# Patient Record
Sex: Male | Born: 1965 | Race: Black or African American | Hispanic: No | Marital: Married | Smoking: Former smoker
Health system: Southern US, Community
[De-identification: ages and names within clinical notes are randomized; demographics above are authoritative.]

## PROBLEM LIST (undated history)

## (undated) DIAGNOSIS — R519 Headache, unspecified: Secondary | ICD-10-CM

## (undated) DIAGNOSIS — R51 Headache: Secondary | ICD-10-CM

## (undated) DIAGNOSIS — G4730 Sleep apnea, unspecified: Secondary | ICD-10-CM

## (undated) DIAGNOSIS — E7800 Pure hypercholesterolemia, unspecified: Secondary | ICD-10-CM

## (undated) DIAGNOSIS — R011 Cardiac murmur, unspecified: Secondary | ICD-10-CM

## (undated) DIAGNOSIS — T7840XA Allergy, unspecified, initial encounter: Secondary | ICD-10-CM

## (undated) DIAGNOSIS — G4733 Obstructive sleep apnea (adult) (pediatric): Secondary | ICD-10-CM

## (undated) DIAGNOSIS — I10 Essential (primary) hypertension: Secondary | ICD-10-CM

## (undated) HISTORY — DX: Obstructive sleep apnea (adult) (pediatric): G47.33

## (undated) HISTORY — DX: Sleep apnea, unspecified: G47.30

## (undated) HISTORY — DX: Allergy, unspecified, initial encounter: T78.40XA

## (undated) HISTORY — DX: Headache: R51

## (undated) HISTORY — DX: Essential (primary) hypertension: I10

## (undated) HISTORY — DX: Cardiac murmur, unspecified: R01.1

## (undated) HISTORY — DX: Headache, unspecified: R51.9

---

## 1997-04-10 HISTORY — PX: NASAL SINUS SURGERY: SHX719

## 1997-07-19 ENCOUNTER — Emergency Department (HOSPITAL_COMMUNITY): Admission: EM | Admit: 1997-07-19 | Discharge: 1997-07-19 | Payer: Self-pay | Admitting: Internal Medicine

## 1997-08-02 ENCOUNTER — Ambulatory Visit (HOSPITAL_COMMUNITY): Admission: RE | Admit: 1997-08-02 | Discharge: 1997-08-02 | Payer: Self-pay | Admitting: Psychiatry

## 1997-08-14 ENCOUNTER — Emergency Department (HOSPITAL_COMMUNITY): Admission: EM | Admit: 1997-08-14 | Discharge: 1997-08-14 | Payer: Self-pay | Admitting: Emergency Medicine

## 1997-08-23 ENCOUNTER — Emergency Department (HOSPITAL_COMMUNITY): Admission: EM | Admit: 1997-08-23 | Discharge: 1997-08-23 | Payer: Self-pay | Admitting: Emergency Medicine

## 1998-01-16 ENCOUNTER — Emergency Department (HOSPITAL_COMMUNITY): Admission: EM | Admit: 1998-01-16 | Discharge: 1998-01-16 | Payer: Self-pay | Admitting: Emergency Medicine

## 1998-02-13 ENCOUNTER — Emergency Department (HOSPITAL_COMMUNITY): Admission: EM | Admit: 1998-02-13 | Discharge: 1998-02-14 | Payer: Self-pay | Admitting: Emergency Medicine

## 1998-05-28 ENCOUNTER — Emergency Department (HOSPITAL_COMMUNITY): Admission: EM | Admit: 1998-05-28 | Discharge: 1998-05-28 | Payer: Self-pay | Admitting: Emergency Medicine

## 1998-07-18 ENCOUNTER — Emergency Department (HOSPITAL_COMMUNITY): Admission: EM | Admit: 1998-07-18 | Discharge: 1998-07-18 | Payer: Self-pay | Admitting: Emergency Medicine

## 1998-07-19 ENCOUNTER — Emergency Department (HOSPITAL_COMMUNITY): Admission: EM | Admit: 1998-07-19 | Discharge: 1998-07-19 | Payer: Self-pay

## 1998-09-26 ENCOUNTER — Emergency Department (HOSPITAL_COMMUNITY): Admission: EM | Admit: 1998-09-26 | Discharge: 1998-09-26 | Payer: Self-pay | Admitting: Emergency Medicine

## 1998-12-31 ENCOUNTER — Emergency Department (HOSPITAL_COMMUNITY): Admission: EM | Admit: 1998-12-31 | Discharge: 1999-01-01 | Payer: Self-pay | Admitting: Emergency Medicine

## 1999-04-02 ENCOUNTER — Emergency Department (HOSPITAL_COMMUNITY): Admission: EM | Admit: 1999-04-02 | Discharge: 1999-04-02 | Payer: Self-pay

## 1999-04-03 ENCOUNTER — Emergency Department (HOSPITAL_COMMUNITY): Admission: EM | Admit: 1999-04-03 | Discharge: 1999-04-03 | Payer: Self-pay | Admitting: Emergency Medicine

## 1999-06-05 ENCOUNTER — Emergency Department (HOSPITAL_COMMUNITY): Admission: EM | Admit: 1999-06-05 | Discharge: 1999-06-05 | Payer: Self-pay

## 1999-07-09 ENCOUNTER — Emergency Department (HOSPITAL_COMMUNITY): Admission: EM | Admit: 1999-07-09 | Discharge: 1999-07-10 | Payer: Self-pay | Admitting: Emergency Medicine

## 1999-07-10 ENCOUNTER — Encounter: Payer: Self-pay | Admitting: Emergency Medicine

## 1999-10-26 ENCOUNTER — Emergency Department (HOSPITAL_COMMUNITY): Admission: EM | Admit: 1999-10-26 | Discharge: 1999-10-26 | Payer: Self-pay | Admitting: Emergency Medicine

## 2000-01-09 ENCOUNTER — Emergency Department (HOSPITAL_COMMUNITY): Admission: EM | Admit: 2000-01-09 | Discharge: 2000-01-09 | Payer: Self-pay | Admitting: Emergency Medicine

## 2000-03-10 ENCOUNTER — Emergency Department (HOSPITAL_COMMUNITY): Admission: EM | Admit: 2000-03-10 | Discharge: 2000-03-10 | Payer: Self-pay | Admitting: Emergency Medicine

## 2000-03-24 ENCOUNTER — Emergency Department (HOSPITAL_COMMUNITY): Admission: EM | Admit: 2000-03-24 | Discharge: 2000-03-24 | Payer: Self-pay | Admitting: Internal Medicine

## 2001-07-04 ENCOUNTER — Encounter: Payer: Self-pay | Admitting: Emergency Medicine

## 2001-07-04 ENCOUNTER — Emergency Department (HOSPITAL_COMMUNITY): Admission: EM | Admit: 2001-07-04 | Discharge: 2001-07-04 | Payer: Self-pay | Admitting: Emergency Medicine

## 2001-07-08 ENCOUNTER — Encounter: Payer: Self-pay | Admitting: Occupational Medicine

## 2001-07-08 ENCOUNTER — Encounter: Admission: RE | Admit: 2001-07-08 | Discharge: 2001-07-08 | Payer: Self-pay | Admitting: Occupational Medicine

## 2001-07-12 ENCOUNTER — Encounter: Payer: Self-pay | Admitting: Emergency Medicine

## 2001-07-12 ENCOUNTER — Emergency Department (HOSPITAL_COMMUNITY): Admission: EM | Admit: 2001-07-12 | Discharge: 2001-07-12 | Payer: Self-pay | Admitting: Emergency Medicine

## 2001-12-13 ENCOUNTER — Encounter: Payer: Self-pay | Admitting: Cardiology

## 2001-12-13 ENCOUNTER — Inpatient Hospital Stay (HOSPITAL_COMMUNITY): Admission: EM | Admit: 2001-12-13 | Discharge: 2001-12-14 | Payer: Self-pay | Admitting: Emergency Medicine

## 2002-04-01 ENCOUNTER — Emergency Department (HOSPITAL_COMMUNITY): Admission: EM | Admit: 2002-04-01 | Discharge: 2002-04-02 | Payer: Self-pay | Admitting: Emergency Medicine

## 2002-05-20 ENCOUNTER — Encounter: Payer: Self-pay | Admitting: Emergency Medicine

## 2002-05-20 ENCOUNTER — Emergency Department (HOSPITAL_COMMUNITY): Admission: EM | Admit: 2002-05-20 | Discharge: 2002-05-20 | Payer: Self-pay | Admitting: Emergency Medicine

## 2003-01-08 ENCOUNTER — Emergency Department (HOSPITAL_COMMUNITY): Admission: EM | Admit: 2003-01-08 | Discharge: 2003-01-08 | Payer: Self-pay | Admitting: Emergency Medicine

## 2003-02-21 ENCOUNTER — Emergency Department (HOSPITAL_COMMUNITY): Admission: EM | Admit: 2003-02-21 | Discharge: 2003-02-21 | Payer: Self-pay | Admitting: Emergency Medicine

## 2003-12-17 ENCOUNTER — Emergency Department (HOSPITAL_COMMUNITY): Admission: EM | Admit: 2003-12-17 | Discharge: 2003-12-18 | Payer: Self-pay | Admitting: Emergency Medicine

## 2004-07-12 ENCOUNTER — Emergency Department (HOSPITAL_COMMUNITY): Admission: EM | Admit: 2004-07-12 | Discharge: 2004-07-12 | Payer: Self-pay | Admitting: Emergency Medicine

## 2005-03-10 ENCOUNTER — Observation Stay (HOSPITAL_COMMUNITY): Admission: EM | Admit: 2005-03-10 | Discharge: 2005-03-11 | Payer: Self-pay | Admitting: Emergency Medicine

## 2005-03-10 ENCOUNTER — Encounter (INDEPENDENT_AMBULATORY_CARE_PROVIDER_SITE_OTHER): Payer: Self-pay | Admitting: *Deleted

## 2005-08-12 ENCOUNTER — Emergency Department (HOSPITAL_COMMUNITY): Admission: EM | Admit: 2005-08-12 | Discharge: 2005-08-12 | Payer: Self-pay | Admitting: Emergency Medicine

## 2005-12-01 ENCOUNTER — Emergency Department (HOSPITAL_COMMUNITY): Admission: EM | Admit: 2005-12-01 | Discharge: 2005-12-01 | Payer: Self-pay | Admitting: Emergency Medicine

## 2006-11-07 ENCOUNTER — Emergency Department (HOSPITAL_COMMUNITY): Admission: EM | Admit: 2006-11-07 | Discharge: 2006-11-07 | Payer: Self-pay | Admitting: Emergency Medicine

## 2007-02-11 HISTORY — PX: APPENDECTOMY: SHX54

## 2007-07-21 ENCOUNTER — Ambulatory Visit: Payer: Self-pay | Admitting: Internal Medicine

## 2007-07-22 ENCOUNTER — Ambulatory Visit: Payer: Self-pay | Admitting: *Deleted

## 2008-02-15 ENCOUNTER — Emergency Department (HOSPITAL_COMMUNITY): Admission: EM | Admit: 2008-02-15 | Discharge: 2008-02-15 | Payer: Self-pay | Admitting: Emergency Medicine

## 2009-03-19 ENCOUNTER — Emergency Department (HOSPITAL_COMMUNITY): Admission: EM | Admit: 2009-03-19 | Discharge: 2009-03-19 | Payer: Self-pay | Admitting: Emergency Medicine

## 2009-04-18 ENCOUNTER — Emergency Department (HOSPITAL_COMMUNITY): Admission: EM | Admit: 2009-04-18 | Discharge: 2009-04-18 | Payer: Self-pay | Admitting: Family Medicine

## 2010-06-28 NOTE — H&P (Signed)
NAME:  Rick Harris, Rick Harris                          ACCOUNT NO.:  [id number]   MEDICAL RECORD NO.:  [id number]                   PATIENT TYPE:  EMS   LOCATION:  ED                                   FACILITY:  [facility]   PHYSICIAN:  Learta Codding, M.D. [facility]             DATE OF BIRTH:  [birth year]   DATE OF ADMISSION:  12/13/2001  DATE OF DISCHARGE:                                HISTORY & PHYSICAL   PRIMARY CARE PHYSICIAN:  None.   CARDIOLOGIST:  New -- Dr. Learta Codding.   CURRENT COMPLAINTS:  Presyncope and mildly elevated cardiac enzymes.   HISTORY OF PRESENT ILLNESS:  The patient is a 45 year old African American  with no significant past medical history.  The patient did report that  several months ago while at work, injuring himself and having several rib  fractures.  The patient, tonight, was at work and around [employer], when he was  looking for materials for loading his truck, he started developing  dizziness.  The patient went inside and drank some water when he started  coughing.  This was associated with intense pain in the left side of the  chest at the site of his prior rib fractures.  He also felt that he was  getting an asthma attack.  Subsequently, the patient started feeling  extremely dizzy and felt presyncopal.  However, he states that clearly like  he never blacked out.  He told his boss and felt that he needed to go home.  He drove himself home but felt quite dizzy, although then the patient  decided to come to the emergency room for further evaluation.  He denies  currently shortness of breath or orthopnea and PND.  He does have left-sided  chest pain, particularly on deep inspiration at the level of the 7th rib.  He currently has no further dizziness.  He does report a lazy eye on the  right eye which was noticeable when I was interviewing the patient.  He was  evaluated in the emergency room with cardiac enzymes with mildly elevated CK  and CK-MB; his troponin, however,  was negative.  His electrocardiogram  showed ST elevation in lead II, III and aVF, as well as ST elevation in lead  V3 and V4, however, he states this appears to be most consistent with early  repolarization.  A bedside echocardiographic study was done which  demonstrated normal LV function, no segmental wall motion abnormality and no  significant valvular lesions.   ALLERGIES:  No known drug allergies.   PAST MEDICAL HISTORY:  1. History of rib fractures briefly as outlined above.  2. History of asthma.  3. Hyperlipidemia.   SOCIAL HISTORY:  The patient lives in [place] with his wife.  He works  for a [employer].  He denies tobacco use.  He denies alcohol  use.   FAMILY HISTORY:  Noncontributory.  REVIEW OF SYSTEMS:  As per HPI.  No nausea or vomiting.  No fever or chills.  No orthopnea or PND.  No palpitations.  No syncope.   PHYSICAL EXAMINATION:  VITAL SIGNS:  Blood pressure is 130/96 with a heart  rate of 48 beats per minute.  Temperature is afebrile.  GENERAL:  Well-nourished African American in no apparent distress.  HEENT:  Pupils are equal.  Conjunctivae clear.  Drift of the right eye.  NECK:  Neck is supple.  Normal upstroke.  No carotid bruits.  LUNGS:  Lungs clear.  HEART:  Regular rate and rhythm.  Normal S1 and S2 and no murmurs, rubs, or  gallops.  ABDOMEN:  Abdomen soft and nontender.  No rebound or guarding.  Good bowel  sounds.  EXTREMITIES:  Peripheral pulses 2+.  No cyanosis, clubbing or edema.   LABORATORY AND ACCESSORY CLINICAL DATA:  Labs are currently pending but CK  and CK-MB were reportedly mildly elevated.  Troponin was negative.   Twelve-lead electrocardiogram:  Sinus bradycardia, early repolarization,  probably normal variant.   IMPRESSION AND PLAN:  1. Presyncope.  The patient does report a prolonged episode of marked     dizziness, however, he reported prodromal pain in the left chest as well     as a sensation of feeling hot  and short of breath.  I think his episode     is most consistent with a vasovagal/vasodepressor episode.  However, he     does have mildly elevated CK-MB fraction and he has an abnormal     electrocardiogram (although most consistent with early repolarization),     however, I do think it is reasonable to admit the patient overnight and     rule out myocardial infarction, particularly based on the abnormal     electrocardiogram.  His echocardiographic study does not show any wall     motion abnormalities, however.  I suspect his symptoms may well be     related to his coughing spell with associated left-sided pain at the site     of prior rib fractures.  We will give the patient pain control for     tonight.  I have also requested that he follow up with his orthopedic     doctor, with whom he has an appointment at 3:45 tomorrow.  2. Elevated CK and CK-MB.  Doubt this patient has a myocardial infarction.     Will get troponin every eight hours x2.  Of note was that the bedside     echocardiogram was negative.  3. Rule out pulmonary embolism.  We will get a D-dimer level; if negative,     no further workup is needed.  4. Chronic chest wall pain with history of rib fractures.  5. Disposition:  Anticipate discharge in the morning if cardiac enzymes and     D-dimer level are negative.  I have discussed with the patient to     possibly obtain an outpatient Cardiolite study in the next couple of     weeks after his rib pain has resolved.                                               Learta Codding, M.D. [facility]    GED/MEDQ  D:  12/13/2001  T:  12/14/2001  Job:  [id number]

## 2010-06-28 NOTE — H&P (Signed)
NAME:  Rick Harris, Rick Harris                ACCOUNT NO.:  [id number]   MEDICAL RECORD NO.:  [id number]          PATIENT TYPE:  EMS   LOCATION:  ED                           FACILITY:  [facility]   PHYSICIAN:  Alfonse Ras, MD   DATE OF BIRTH:  Jan 17, 1966   DATE OF ADMISSION:  03/10/2005  DATE OF DISCHARGE:                                HISTORY & PHYSICAL   ADMISSION DIAGNOSIS:  Abdominal pain, probably acute appendicitis.   HISTORY OF PRESENT ILLNESS:  The patient is a 45 year old black male who  started with right lower quadrant abdominal pain about 4 days ago.  He has  had no nausea or vomiting.  Was seen at [facility] and sent to [facility]  Radiology.  Per the patient's report, the CT showed acute appendicitis.  The  patient has continued with unchanged pain in his right lower quadrant for  the last 3 days.  The patient is hungry.  Workup in the emergency room  showed a normal white count of 8000 without change in differential.   PAST MEDICAL HISTORY:  None.   PAST SURGICAL HISTORY:  None.   MEDICATIONS:  Proventil p.r.n. asthma which he takes very seldom.   PHYSICAL EXAMINATION:  GENERAL: He is an age-appropriate black male in  minimal distress sitting on the stretcher in the hallway at [facility]  Emergency Room.  VITAL SIGNS:  Temperature 97.3, blood pressure 131/65, heart rate 45,  respiratory rate 17.  HEENT:  Benign.  Normocephalic and atraumatic.  Pupils equal, round, and  reactive to light.  LUNGS: Clear to auscultation and percussion x2.  HEART: Regular rate and rhythm without murmurs, rubs, or gallops.  ABDOMEN: Soft but tender in the right lower quadrant with positive rebound,  positive Rovsing's sign, and positive localized peritoneal tenderness.  EXTREMITIES:  Shows no clubbing, cyanosis, or edema.  Normal gait and  station.   IMPRESSION:  Acute appendicitis.   PLAN:  Laparoscopic appendectomy.   I discussed the risks and indications with the patient, explaining  there is  risk of bleeding, infection, injury to surrounding structures, anesthesia  risk and even death.  The patient understands and wishes to proceed.      Alfonse Ras, MD  Electronically Signed     KRE/MEDQ  D:  03/10/2005  T:  03/10/2005  Job:  [id number]

## 2010-06-28 NOTE — Discharge Summary (Signed)
NAME:  Rick Harris, Rick Harris                          ACCOUNT NO.:  [id number]   MEDICAL RECORD NO.:  [id number]                   PATIENT TYPE:  INP   LOCATION:  0378                                 FACILITY:  [facility]   PHYSICIAN:  Learta Codding, M.D. [facility]             DATE OF BIRTH:  1966/02/01   DATE OF ADMISSION:  12/13/2001  DATE OF DISCHARGE:  12/14/2001                           DISCHARGE SUMMARY - REFERRING   HISTORY:  The patient is a 45 year old African-American male without  significant past medical history. Several months ago while at work, he  injured himself and suffered several rib fractures.  On the evening of  12/13/2001 at around 6:15, he developed some dizziness while preparing to  load his truck.  The patient went inside and drank some water and started  coughing. This was associated with left-sided intense pain at the site of  his prior rib fractures.  He also felt that he may be getting an asthma  attack.  At that point, he felt dizzy and presyncopal.  He never did black  out and told his boss that he needed to go home.  He did drive himself home  but felt quite dizzy and then decided to come to the emergency room for  further evaluation.   In the emergency room, he continued to hae left-sided chest discomfort,  particularly on deep inspiration at the level of  the seventh rib.  He did  not have any further dizziness.  Initial CK-MB was abnormal with a normal  troponin.  An EKG showed early repolarization; thus he is admitted for  observation.   HISTORY AND PHYSICAL:  1. Asthma.  2. Hyperlipidemia.  3. Recent rib fractures.  4. Slight obesity.   LABORATORY DATA:  Admission hemoglobin 12.4, hematocrit 37.9, platelets 241,  WBC 7.8. Sodium 138, potassium 4.1, BUN 14, creatinine 1.2, glucose 105.  Normal LFTs.  D-dimer was less than 0.22  CK total, MB were 406, 102 and  relative index 3.  The second CK total was 333 with MB 8.8 and relative  index of 2.6.  Troponins  were 0.1 and less than 0.01.   EKG showed sinus bradycardia, early repolarization, nonspecific ST-T wave  changes.   HOSPITAL COURSE:  The patient was admitted 3 [facility] at [facility]  overnight.  He did not have any further dizziness; however, he was still  complaining of left-sided chest discomfort, especially with inspiration and  movement.  D-dimer was within normal limits.  Troponin and EKG negative for  myocardial infarction.  After review by Dr. Eden Emms, it was felt the near  syncope was possibly related to coughing and pain exacerbation by recent rib  fractures.  He does have abnormal CK and total MB of unknown etiology.  It  was noted a bedside echocardiogram did not show any abnormalities.   DISPOSITION:  He is discharged home and asked to continue his  home  medications which are believed to include Bextra, Vicodin, Voltaren, and  Flexeril.  He will see orthopedics this afternoon for followup.  He was  asked to maintain low-fat, low-salt, and low-cholesterol diet.  His activity  from a cardiac standpoint was not restricted.  He  will undergo stress Cardiolite on Thursday, November 13, at 9:30 a.m.  He is  instructed not to eat or drink after midnight; however, he may take his  medications with a small sip of water.  He was also advised that he should  obtain a primary care physician.     Joellyn Rued, P.A. [facility]                    Learta Codding, M.D. [facility]    EW/MEDQ  D:  12/14/2001  T:  12/14/2001  Job:  [id number]   cc:   Patient's home

## 2010-06-28 NOTE — Op Note (Signed)
Rick Harris, Rick Harris                ACCOUNT NO.:  [id number]   MEDICAL RECORD NO.:  [id number]          PATIENT TYPE:  INP   LOCATION:  1010                         FACILITY:  [facility]   PHYSICIAN:  Alfonse Ras, MD   DATE OF BIRTH:  1966/02/02   DATE OF PROCEDURE:  03/10/2005  DATE OF DISCHARGE:                                 OPERATIVE REPORT   PREOPERATIVE DIAGNOSIS:  Acute appendicitis.   POSTOPERATIVE DIAGNOSIS:  Acute appendicitis.   PROCEDURE:  Laparoscopic appendectomy.   ANESTHESIA:  General.   SURGEON:  Alfonse Ras, M.D.   DESCRIPTION:  The patient was taken to the operating room and placed in the  supine position after adequate general anesthesia was induced. Foley  catheter was placed and the abdomen was prepped and of normal sterile  fashion. Using a 12 mm OptiVu, peritoneal access was obtained under direct  vision through the left abdomen. Additional 5 and 12 mm trocars were placed.  An acutely inflamed appendix was identified, grasped and retracted  anteriorly. Mesoappendix was taken down with harmonic scalpel. The base the  appendix was transected using a vascular load GIA 45 stapling device. It was  placed in an EndoCatch bag and removed through the left lower quadrant port.  Adequate hemostasis was ensured. The right lower quadrant was irrigated.  Pneumoperitoneum was released. Trocars were removed. Skin incisions were  closed with subcuticular 4-0 Monocryl. Steri-Strips and sterile dressings  were applied. The patient tolerated the procedure well and went to the PACU  in good condition.      Alfonse Ras, MD  Electronically Signed     KRE/MEDQ  D:  03/11/2005  T:  03/11/2005  Job:  [id number]

## 2011-05-19 ENCOUNTER — Emergency Department (HOSPITAL_COMMUNITY): Payer: PRIVATE HEALTH INSURANCE

## 2011-05-19 ENCOUNTER — Encounter (HOSPITAL_COMMUNITY): Payer: Self-pay | Admitting: Emergency Medicine

## 2011-05-19 ENCOUNTER — Emergency Department (HOSPITAL_COMMUNITY)
Admission: EM | Admit: 2011-05-19 | Discharge: 2011-05-19 | Disposition: A | Discharge status: Home / Self Care | Payer: PRIVATE HEALTH INSURANCE | Attending: Emergency Medicine | Admitting: Emergency Medicine

## 2011-05-19 DIAGNOSIS — R0602 Shortness of breath: Secondary | ICD-10-CM | POA: Insufficient documentation

## 2011-05-19 DIAGNOSIS — J40 Bronchitis, not specified as acute or chronic: Secondary | ICD-10-CM | POA: Insufficient documentation

## 2011-05-19 DIAGNOSIS — R05 Cough: Secondary | ICD-10-CM | POA: Insufficient documentation

## 2011-05-19 DIAGNOSIS — R51 Headache: Secondary | ICD-10-CM | POA: Insufficient documentation

## 2011-05-19 DIAGNOSIS — J45909 Unspecified asthma, uncomplicated: Secondary | ICD-10-CM | POA: Insufficient documentation

## 2011-05-19 DIAGNOSIS — E7800 Pure hypercholesterolemia, unspecified: Secondary | ICD-10-CM | POA: Insufficient documentation

## 2011-05-19 DIAGNOSIS — R509 Fever, unspecified: Secondary | ICD-10-CM

## 2011-05-19 DIAGNOSIS — R059 Cough, unspecified: Secondary | ICD-10-CM | POA: Insufficient documentation

## 2011-05-19 HISTORY — DX: Pure hypercholesterolemia, unspecified: E78.00

## 2011-05-19 MED ORDER — ALBUTEROL SULFATE (5 MG/ML) 0.5% IN NEBU
5.0000 mg | INHALATION_SOLUTION | Freq: Once | RESPIRATORY_TRACT | Status: AC
  Administered 2011-05-19: 5 mg via RESPIRATORY_TRACT
  Filled 2011-05-19: qty 0.5

## 2011-05-19 MED ORDER — IPRATROPIUM BROMIDE 0.02 % IN SOLN
0.5000 mg | Freq: Once | RESPIRATORY_TRACT | Status: AC
  Administered 2011-05-19: 0.5 mg via RESPIRATORY_TRACT
  Filled 2011-05-19: qty 2.5

## 2011-05-19 MED ORDER — IBUPROFEN 200 MG PO TABS
600.0000 mg | ORAL_TABLET | Freq: Once | ORAL | Status: AC
  Administered 2011-05-19: 600 mg via ORAL
  Filled 2011-05-19: qty 3

## 2011-05-19 MED ORDER — AZITHROMYCIN 250 MG PO TABS: ORAL_TABLET | ORAL | Status: AC

## 2011-05-19 MED ORDER — ALBUTEROL SULFATE HFA 108 (90 BASE) MCG/ACT IN AERS: 2.0000 | INHALATION_SPRAY | RESPIRATORY_TRACT | Status: DC | PRN

## 2011-05-19 NOTE — ED Provider Notes (Signed)
History     CSN: [id number]  Arrival date & time 05/19/11  4098   First MD Initiated Contact with Patient 05/19/11 [phone]      Chief Complaint  Patient presents with  . Shortness of Breath  . Cough  . Fever  . Headache  . Fatigue    (Consider location/radiation/quality/duration/timing/severity/associated sxs/prior treatment) Patient is a 46 y.o. male presenting with shortness of breath, cough, fever, and headaches. The history is provided by the patient.  Shortness of Breath  Associated symptoms include a fever, cough and shortness of breath. Pertinent negatives include no chest pain.  Cough Associated symptoms include shortness of breath. Pertinent negatives include no chest pain and no eye redness.  Fever Primary symptoms of the febrile illness include fever, cough and shortness of breath. Primary symptoms do not include abdominal pain, vomiting, diarrhea or rash.  Headache  Associated symptoms include a fever and shortness of breath. Pertinent negatives include no vomiting.  pt c/o non productive cough, fever, and wheezing in past couple days. No sore throat or runny nose.  Hx asthma, states uses inhaler prn. +smoker. Cough episodic, no specific exacerbating or alleviating factors.  Denies any recent steroid use, notes single admission w asthma many years ago. No known ill contacts. No chest pain. No gi symptoms. No neck pain or stiffness.      Past Medical History  Diagnosis Date  . Asthma   . Hypercholesteremia     History reviewed. No pertinent past surgical history.  History reviewed. No pertinent family history.  History  Substance Use Topics  . Smoking status: Current Some Day Smoker  . Smokeless tobacco: Not on file  . Alcohol Use: Yes      Review of Systems  Constitutional: Positive for fever.  HENT: Negative for neck pain.   Eyes: Negative for discharge and redness.  Respiratory: Positive for cough and shortness of breath.   Cardiovascular: Negative  for chest pain.  Gastrointestinal: Negative for vomiting, abdominal pain and diarrhea.  Genitourinary: Negative for flank pain.  Musculoskeletal: Negative for back pain.  Skin: Negative for rash.  Neurological: Negative for weakness and numbness.  Hematological: Does not bruise/bleed easily.  Psychiatric/Behavioral: Negative for confusion.    Allergies  Review of patient's allergies indicates no known allergies.  Home Medications  No current outpatient prescriptions on file.  BP 155/69  Pulse 78  Temp(Src) 101.7 F (38.7 C) (Oral)  Resp 20  SpO2 97%  Physical Exam  Nursing note and vitals reviewed. Constitutional: He is oriented to person, place, and time. He appears well-developed and well-nourished. No distress.  HENT:  Head: Atraumatic.  Mouth/Throat: Oropharynx is clear and moist.  Eyes: Pupils are equal, round, and reactive to light.  Neck: Neck supple. No tracheal deviation present.       No stiffness or rigidity  Cardiovascular: Normal rate, regular rhythm, normal heart sounds and intact distal pulses.  Exam reveals no gallop and no friction rub.   No murmur heard. Pulmonary/Chest: Effort normal. No accessory muscle usage. He has wheezes.  Abdominal: Soft. Bowel sounds are normal. He exhibits no distension and no mass. There is no tenderness. There is no rebound and no guarding.  Genitourinary:       No cva tenderness  Musculoskeletal: Normal range of motion. He exhibits no edema and no tenderness.  Lymphadenopathy:    He has no cervical adenopathy.  Neurological: He is alert and oriented to person, place, and time.  Skin: Skin is warm and dry.  Psychiatric: He has a normal mood and affect.    ED Course  Procedures (including critical care time)   No results found for this or any previous visit. Dg Chest 2 View  05/19/2011  *RADIOLOGY REPORT*  Clinical Data: Cough and fever.  CHEST - 2 VIEW  Comparison: 04/18/2009.  Findings: Poor inspiration.  Grossly normal  sized heart.  Clear lungs.  Minimal thoracic spine degenerative changes.  IMPRESSION: No acute abnormality.  Original Report Authenticated By: Darrol Angel, M.D.    MDM  Albuterol and atrovent neb. Cxr. Motrin.   Recheck after single neb, no wheezing. No increased wob. Pt w cough, fever, hx bronchtis/asthma. Will rx bronchitis, alb mdi prn, rec smoking cessation.       Suzi Roots, MD 05/19/11 [phone]

## 2011-05-19 NOTE — Discharge Instructions (Signed)
Take zithromax as prescribed. Use albuterol inhaler as need. Take tylenol/motrin as need. No smoking. Follow up with primary care doctor in 4-5 days if symptoms fail to improve/resolve. Return to ER if worse, trouble breathing, other concern.        Bronchitis Bronchitis is the body's way of reacting to injury and/or infection (inflammation) of the bronchi. Bronchi are the air tubes that extend from the windpipe into the lungs. If the inflammation becomes severe, it may cause shortness of breath. CAUSES  Inflammation may be caused by:  A virus.   Germs (bacteria).   Dust.   Allergens.   Pollutants and many other irritants.  The cells lining the bronchial tree are covered with tiny hairs (cilia). These constantly beat upward, away from the lungs, toward the mouth. This keeps the lungs free of pollutants. When these cells become too irritated and are unable to do their job, mucus begins to develop. This causes the characteristic cough of bronchitis. The cough clears the lungs when the cilia are unable to do their job. Without either of these protective mechanisms, the mucus would settle in the lungs. Then you would develop pneumonia. Smoking is a common cause of bronchitis and can contribute to pneumonia. Stopping this habit is the single most important thing you can do to help yourself. TREATMENT   Your caregiver may prescribe an antibiotic if the cough is caused by bacteria. Also, medicines that open up your airways make it easier to breathe. Your caregiver may also recommend or prescribe an expectorant. It will loosen the mucus to be coughed up. Only take over-the-counter or prescription medicines for pain, discomfort, or fever as directed by your caregiver.   Removing whatever causes the problem (smoking, for example) is critical to preventing the problem from getting worse.   Cough suppressants may be prescribed for relief of cough symptoms.   Inhaled medicines may be prescribed to  help with symptoms now and to help prevent problems from returning.   For those with recurrent (chronic) bronchitis, there may be a need for steroid medicines.  SEEK IMMEDIATE MEDICAL CARE IF:   During treatment, you develop more pus-like mucus (purulent sputum).   You have a fever.   Your baby is older than 3 months with a rectal temperature of 102 F (38.9 C) or higher.   Your baby is 63 months old or younger with a rectal temperature of 100.4 F (38 C) or higher.   You become progressively more ill.   You have increased difficulty breathing, wheezing, or shortness of breath.  It is necessary to seek immediate medical care if you are elderly or sick from any other disease. MAKE SURE YOU:   Understand these instructions.   Will watch your condition.   Will get help right away if you are not doing well or get worse.  Document Released: 01/27/2005 Document Revised: 01/16/2011 Document Reviewed: 12/07/2007 [facility] Patient Information 2012 [place], [place].      Smoking Cessation This document explains the best ways for you to quit smoking and new treatments to help. It lists new medicines that can double or triple your chances of quitting and quitting for good. It also considers ways to avoid relapses and concerns you may have about quitting, including weight gain. NICOTINE: A POWERFUL ADDICTION If you have tried to quit smoking, you know how hard it can be. It is hard because nicotine is a very addictive drug. For some people, it can be as addictive as heroin or cocaine.  Usually, people make 2 or 3 tries, or more, before finally being able to quit. Each time you try to quit, you can learn about what helps and what hurts. Quitting takes hard work and a lot of effort, but you can quit smoking. QUITTING SMOKING IS ONE OF THE MOST IMPORTANT THINGS YOU WILL EVER DO.  You will live longer, feel better, and live better.   The impact on your body of quitting smoking is felt almost  immediately:   Within 20 minutes, blood pressure decreases. Pulse returns to its normal level.   After 8 hours, carbon monoxide levels in the blood return to normal. Oxygen level increases.   After 24 hours, chance of heart attack starts to decrease. Breath, hair, and body stop smelling like smoke.   After 48 hours, damaged nerve endings begin to recover. Sense of taste and smell improve.   After 72 hours, the body is virtually free of nicotine. Bronchial tubes relax and breathing becomes easier.   After 2 to 12 weeks, lungs can hold more air. Exercise becomes easier and circulation improves.   Quitting will reduce your risk of having a heart attack, stroke, cancer, or lung disease:   After 1 year, the risk of coronary heart disease is cut in half.   After 5 years, the risk of stroke falls to the same as a nonsmoker.   After 10 years, the risk of lung cancer is cut in half and the risk of other cancers decreases significantly.   After 15 years, the risk of coronary heart disease drops, usually to the level of a nonsmoker.   If you are pregnant, quitting smoking will improve your chances of having a healthy baby.   The people you live with, especially your children, will be healthier.   You will have extra money to spend on things other than cigarettes.  FIVE KEYS TO QUITTING Studies have shown that these 5 steps will help you quit smoking and quit for good. You have the best chances of quitting if you use them together: 1. Get ready.  2. Get support and encouragement.  3. Learn new skills and behaviors.  4. Get medicine to reduce your nicotine addiction and use it correctly.  5. Be prepared for relapse or difficult situations. Be determined to continue trying to quit, even if you do not succeed at first.  1. GET READY  Set a quit date.   Change your environment.   Get rid of ALL cigarettes, ashtrays, matches, and lighters in your home, car, and place of work.   Do not let  people smoke in your home.   Review your past attempts to quit. Think about what worked and what did not.   Once you quit, do not smoke. NOT EVEN A PUFF!  2. GET SUPPORT AND ENCOURAGEMENT Studies have shown that you have a better chance of being successful if you have help. You can get support in many ways.  Tell your family, friends, and coworkers that you are going to quit and need their support. Ask them not to smoke around you.   Talk to your caregivers (doctor, dentist, nurse, pharmacist, psychologist, and/or smoking counselor).   Get individual, group, or telephone counseling and support. The more counseling you have, the better your chances are of quitting. Programs are available at [employer] and health centers. Call your local health department for information about programs in your area.   Spiritual beliefs and practices may help some smokers quit.  Quit meters are Photographer that keep track of quit statistics, such as amount of "quit-time," cigarettes not smoked, and money saved.   Many smokers find one or more of the many self-help books available useful in helping them quit and stay off tobacco.  3. LEARN NEW SKILLS AND BEHAVIORS  Try to distract yourself from urges to smoke. Talk to someone, go for a walk, or occupy your time with a task.   When you first try to quit, change your routine. Take a different route to work. Drink tea instead of coffee. Eat breakfast in a different place.   Do something to reduce your stress. Take a hot bath, exercise, or read a book.   Plan something enjoyable to do every day. Reward yourself for not smoking.   Explore interactive web-based programs that specialize in helping you quit.  4. GET MEDICINE AND USE IT CORRECTLY Medicines can help you stop smoking and decrease the urge to smoke. Combining medicine with the above behavioral methods and support can quadruple your chances of successfully  quitting smoking. The [place] Food and Drug Administration (FDA) has approved 7 medicines to help you quit smoking. These medicines fall into 3 categories.  Nicotine replacement therapy (delivers nicotine to your body without the negative effects and risks of smoking):   Nicotine gum: Available over-the-counter.   Nicotine lozenges: Available over-the-counter.   Nicotine inhaler: Available by prescription.   Nicotine nasal spray: Available by prescription.   Nicotine skin patches (transdermal): Available by prescription and over-the-counter.   Antidepressant medicine (helps people abstain from smoking, but how this works is unknown):   Bupropion sustained-release (SR) tablets: Available by prescription.   Nicotinic receptor partial agonist (simulates the effect of nicotine in your brain):   Varenicline tartrate tablets: Available by prescription.   Ask your caregiver for advice about which medicines to use and how to use them. Carefully read the information on the package.   Everyone who is trying to quit may benefit from using a medicine. If you are pregnant or trying to become pregnant, nursing an infant, you are under age 58, or you smoke fewer than 10 cigarettes per day, talk to your caregiver before taking any nicotine replacement medicines.   You should stop using a nicotine replacement product and call your caregiver if you experience nausea, dizziness, weakness, vomiting, fast or irregular heartbeat, mouth problems with the lozenge or gum, or redness or swelling of the skin around the patch that does not go away.   Do not use any other product containing nicotine while using a nicotine replacement product.   Talk to your caregiver before using these products if you have diabetes, heart disease, asthma, stomach ulcers, you had a recent heart attack, you have high blood pressure that is not controlled with medicine, a history of irregular heartbeat, or you have been prescribed  medicine to help you quit smoking.  5. BE PREPARED FOR RELAPSE OR DIFFICULT SITUATIONS  Most relapses occur within the first 3 months after quitting. Do not be discouraged if you start smoking again. Remember, most people try several times before they finally quit.   You may have symptoms of withdrawal because your body is used to nicotine. You may crave cigarettes, be irritable, feel very hungry, cough often, get headaches, or have difficulty concentrating.   The withdrawal symptoms are only temporary. They are strongest when you first quit, but they will go away within 10 to 14 days.  Here are  some difficult situations to watch for:  Alcohol. Avoid drinking alcohol. Drinking lowers your chances of successfully quitting.   Caffeine. Try to reduce the amount of caffeine you consume. It also lowers your chances of successfully quitting.   Other smokers. Being around smoking can make you want to smoke. Avoid smokers.   Weight gain. Many smokers will gain weight when they quit, usually less than 10 pounds. Eat a healthy diet and stay active. Do not let weight gain distract you from your main goal, quitting smoking. Some medicines that help you quit smoking may also help delay weight gain. You can always lose the weight gained after you quit.   Bad mood or depression. There are a lot of ways to improve your mood other than smoking.  If you are having problems with any of these situations, talk to your caregiver. SPECIAL SITUATIONS AND CONDITIONS Studies suggest that everyone can quit smoking. Your situation or condition can give you a special reason to quit.  Pregnant women/new mothers: By quitting, you protect your baby's health and your own.   Hospitalized patients: By quitting, you reduce health problems and help healing.   Heart attack patients: By quitting, you reduce your risk of a second heart attack.   Lung, head, and neck cancer patients: By quitting, you reduce your chance of a  second cancer.   Parents of children and adolescents: By quitting, you protect your children from illnesses caused by secondhand smoke.  QUESTIONS TO THINK ABOUT Think about the following questions before you try to stop smoking. You may want to talk about your answers with your caregiver.  Why do you want to quit?   If you tried to quit in the past, what helped and what did not?   What will be the most difficult situations for you after you quit? How will you plan to handle them?   Who can help you through the tough times? Your family? Friends? Caregiver?   What pleasures do you get from smoking? What ways can you still get pleasure if you quit?  Here are some questions to ask your caregiver:  How can you help me to be successful at quitting?   What medicine do you think would be best for me and how should I take it?   What should I do if I need more help?   What is smoking withdrawal like? How can I get information on withdrawal?  Quitting takes hard work and a lot of effort, but you can quit smoking. FOR MORE INFORMATION  Smokefree.gov (http://www.davis-sullivan.com/) provides free, accurate, evidence-based information and professional assistance to help support the immediate and long-term needs of people trying to quit smoking. Document Released: 01/21/2001 Document Revised: 01/16/2011 Document Reviewed: 11/13/2008 [facility] Patient Information 2012 [place], [place].     Asthma, Adult Asthma is caused by narrowing of the air passages in the lungs. It may be triggered by pollen, dust, animal dander, molds, some foods, respiratory infections, exposure to smoke, exercise, emotional stress or other allergens (things that cause allergic reactions or allergies). Repeat attacks are common. HOME CARE INSTRUCTIONS   Use prescription medications as ordered by your caregiver.   Avoid pollen, dust, animal dander, molds, smoke and other things that cause attacks at home and at work.   You may  have fewer attacks if you decrease dust in your home. Electrostatic air cleaners may help.   It may help to replace your pillows or mattress with materials less likely to cause allergies.  Talk to your caregiver about an action plan for managing asthma attacks at home, including, the use of a peak flow meter which measures the severity of your asthma attack. An action plan can help minimize or stop the attack without having to seek medical care.   If you are not on a fluid restriction, drink 8 to 10 glasses of water each day.   Always have a plan prepared for seeking medical attention, including, calling your physician, accessing local emergency care, and calling 911 (in the [place] for a severe attack.   Discuss possible exercise routines with your caregiver.   If animal dander is the cause of asthma, you may need to get rid of pets.  SEEK MEDICAL CARE IF:   You have wheezing and shortness of breath even if taking medicine to prevent attacks.   You have muscle aches, chest pain or thickening of sputum.   Your sputum changes from clear or white to yellow, green, gray, or bloody.   You have any problems that may be related to the medicine you are taking (such as a rash, itching, swelling or trouble breathing).  SEEK IMMEDIATE MEDICAL CARE IF:   Your usual medicines do not stop your wheezing or there is increased coughing and/or shortness of breath.   You have increased difficulty breathing.   You have a fever.  MAKE SURE YOU:   Understand these instructions.   Will watch your condition.   Will get help right away if you are not doing well or get worse.  Document Released: 01/27/2005 Document Revised: 01/16/2011 Document Reviewed: 09/15/2007 [facility] Patient Information 2012 [place], [place].     Hypertension As your heart beats, it forces blood through your arteries. This force is your blood pressure. If the pressure is too high, it is called hypertension (HTN) or high blood  pressure. HTN is dangerous because you may have it and not know it. High blood pressure may mean that your heart has to work harder to pump blood. Your arteries may be narrow or stiff. The extra work puts you at risk for heart disease, stroke, and other problems.  Blood pressure consists of two numbers, a higher number over a lower, 110/72, for example. It is stated as "110 over 72." The ideal is below 120 for the top number (systolic) and under 80 for the bottom (diastolic). Write down your blood pressure today. You should pay close attention to your blood pressure if you have certain conditions such as:  Heart failure.   Prior heart attack.   Diabetes   Chronic kidney disease.   Prior stroke.   Multiple risk factors for heart disease.  To see if you have HTN, your blood pressure should be measured while you are seated with your arm held at the level of the heart. It should be measured at least twice. A one-time elevated blood pressure reading (especially in the Emergency Department) does not mean that you need treatment. There may be conditions in which the blood pressure is different between your right and left arms. It is important to see your caregiver soon for a recheck. Most people have essential hypertension which means that there is not a specific cause. This type of high blood pressure may be lowered by changing lifestyle factors such as:  Stress.   Smoking.   Lack of exercise.   Excessive weight.   Drug/tobacco/alcohol use.   Eating less salt.  Most people do not have symptoms from high blood pressure until it has  caused damage to the body. Effective treatment can often prevent, delay or reduce that damage. TREATMENT  When a cause has been identified, treatment for high blood pressure is directed at the cause. There are a large number of medications to treat HTN. These fall into several categories, and your caregiver will help you select the medicines that are best for you.  Medications may have side effects. You should review side effects with your caregiver. If your blood pressure stays high after you have made lifestyle changes or started on medicines,   Your medication(s) may need to be changed.   Other problems may need to be addressed.   Be certain you understand your prescriptions, and know how and when to take your medicine.   Be sure to follow up with your caregiver within the time frame advised (usually within two weeks) to have your blood pressure rechecked and to review your medications.   If you are taking more than one medicine to lower your blood pressure, make sure you know how and at what times they should be taken. Taking two medicines at the same time can result in blood pressure that is too low.  SEEK IMMEDIATE MEDICAL CARE IF:  You develop a severe headache, blurred or changing vision, or confusion.   You have unusual weakness or numbness, or a faint feeling.   You have severe chest or abdominal pain, vomiting, or breathing problems.  MAKE SURE YOU:   Understand these instructions.   Will watch your condition.   Will get help right away if you are not doing well or get worse.  Document Released: 01/27/2005 Document Revised: 01/16/2011 Document Reviewed: 09/17/2007 [facility] Patient Information 2012 [place], [place].

## 2011-05-19 NOTE — ED Notes (Addendum)
Pt states he has had a cough, asthma flare-up, fever, ha and weakness since yesterday am.  Pt states cough is non-productive and has gotten worse since yesterday am.  Pt states family member has had similar symptoms.  Pt's lungs have wheezing and rhonchi.  93% on Ra.  97% on 2L.  Pt state that R side feels weak

## 2012-10-19 ENCOUNTER — Other Ambulatory Visit: Payer: Self-pay | Admitting: Family Medicine

## 2012-10-19 ENCOUNTER — Ambulatory Visit
Admission: RE | Admit: 2012-10-19 | Discharge: 2012-10-19 | Disposition: A | Discharge status: Home / Self Care | Payer: Worker's Compensation | Source: Ambulatory Visit | Attending: Family Medicine | Admitting: Family Medicine

## 2012-10-19 DIAGNOSIS — T148XXA Other injury of unspecified body region, initial encounter: Secondary | ICD-10-CM

## 2012-10-19 DIAGNOSIS — M25462 Effusion, left knee: Secondary | ICD-10-CM

## 2013-07-29 ENCOUNTER — Ambulatory Visit (INDEPENDENT_AMBULATORY_CARE_PROVIDER_SITE_OTHER): Payer: 59 | Admitting: Internal Medicine

## 2013-07-29 ENCOUNTER — Other Ambulatory Visit (INDEPENDENT_AMBULATORY_CARE_PROVIDER_SITE_OTHER): Payer: 59

## 2013-07-29 ENCOUNTER — Encounter: Payer: Self-pay | Admitting: Internal Medicine

## 2013-07-29 VITALS — BP 132/80 | HR 52 | Temp 98.4°F | Ht 71.0 in | Wt 243.1 lb

## 2013-07-29 DIAGNOSIS — Z0000 Encounter for general adult medical examination without abnormal findings: Secondary | ICD-10-CM

## 2013-07-29 DIAGNOSIS — R0683 Snoring: Secondary | ICD-10-CM

## 2013-07-29 DIAGNOSIS — G4733 Obstructive sleep apnea (adult) (pediatric): Secondary | ICD-10-CM | POA: Insufficient documentation

## 2013-07-29 DIAGNOSIS — R0989 Other specified symptoms and signs involving the circulatory and respiratory systems: Secondary | ICD-10-CM

## 2013-07-29 DIAGNOSIS — R001 Bradycardia, unspecified: Secondary | ICD-10-CM | POA: Insufficient documentation

## 2013-07-29 DIAGNOSIS — E785 Hyperlipidemia, unspecified: Secondary | ICD-10-CM

## 2013-07-29 DIAGNOSIS — R0609 Other forms of dyspnea: Secondary | ICD-10-CM

## 2013-07-29 LAB — URINALYSIS, ROUTINE W REFLEX MICROSCOPIC
Bilirubin Urine: NEGATIVE
Hgb urine dipstick: NEGATIVE
Ketones, ur: NEGATIVE
Leukocytes, UA: NEGATIVE
NITRITE: NEGATIVE
PH: 6 (ref 5.0–8.0)
Specific Gravity, Urine: >=1.03 — AB (ref 1.000–1.030)
Urine Glucose: NEGATIVE
Urobilinogen, UA: 0.2 (ref 0.0–1.0)

## 2013-07-29 LAB — BASIC METABOLIC PANEL
BUN: 15 mg/dL (ref 6–23)
CALCIUM: 9.5 mg/dL (ref 8.4–10.5)
CO2: 28 mEq/L (ref 19–32)
Chloride: 104 mEq/L (ref 96–112)
Creatinine, Ser: 1.1 mg/dL (ref 0.4–1.5)
GFR: 97.86 mL/min (ref >60.00)
GLUCOSE: 88 mg/dL (ref 70–99)
Potassium: 4.7 mEq/L (ref 3.5–5.1)
SODIUM: 139 meq/L (ref 135–145)

## 2013-07-29 LAB — TSH: TSH: 1.84 u[IU]/mL (ref 0.35–4.50)

## 2013-07-29 LAB — CBC WITH DIFFERENTIAL/PLATELET
BASOS PCT: 0.4 % (ref 0.0–3.0)
Basophils Absolute: 0 10*3/uL (ref 0.0–0.1)
EOS ABS: 0.4 10*3/uL (ref 0.0–0.7)
EOS PCT: 5.3 % — AB (ref 0.0–5.0)
HEMATOCRIT: 40.4 % (ref 39.0–52.0)
Hemoglobin: 12.9 g/dL — ABNORMAL LOW (ref 13.0–17.0)
Lymphocytes Relative: 44.4 % (ref 12.0–46.0)
Lymphs Abs: 3.5 10*3/uL (ref 0.7–4.0)
MCHC: 31.9 g/dL (ref 30.0–36.0)
MCV: 72.5 fl — ABNORMAL LOW (ref 78.0–100.0)
Monocytes Absolute: 0.4 10*3/uL (ref 0.1–1.0)
Monocytes Relative: 4.6 % (ref 3.0–12.0)
NEUTROS ABS: 3.6 10*3/uL (ref 1.4–7.7)
NEUTROS PCT: 45.3 % (ref 43.0–77.0)
PLATELETS: 253 10*3/uL (ref 150.0–400.0)
RBC: 5.57 Mil/uL (ref 4.22–5.81)
RDW: 14.3 % (ref 11.5–15.5)
WBC: 7.9 10*3/uL (ref 4.0–10.5)

## 2013-07-29 LAB — HEPATIC FUNCTION PANEL
ALT: 41 U/L (ref 0–53)
AST: 39 U/L — ABNORMAL HIGH (ref 0–37)
Albumin: 4.2 g/dL (ref 3.5–5.2)
Alkaline Phosphatase: 71 U/L (ref 39–117)
BILIRUBIN TOTAL: 0.5 mg/dL (ref 0.2–1.2)
Bilirubin, Direct: 0.1 mg/dL (ref 0.0–0.3)
TOTAL PROTEIN: 7.7 g/dL (ref 6.0–8.3)

## 2013-07-29 LAB — LIPID PANEL
CHOLESTEROL: 266 mg/dL — AB (ref 0–200)
HDL: 54.5 mg/dL (ref >39.00)
LDL Cholesterol: 174 mg/dL — ABNORMAL HIGH (ref 0–99)
NonHDL: 211.5
TRIGLYCERIDES: 187 mg/dL — AB (ref 0.0–149.0)
Total CHOL/HDL Ratio: 5
VLDL: 37.4 mg/dL (ref 0.0–40.0)

## 2013-07-29 LAB — PSA: PSA: 0.32 ng/mL (ref 0.10–4.00)

## 2013-07-29 MED ORDER — PRAVASTATIN SODIUM 20 MG PO TABS: 20.0000 mg | ORAL_TABLET | Freq: Every day | ORAL | Status: DC

## 2013-07-29 NOTE — Progress Notes (Signed)
Pre visit review using our clinic review tool, if applicable. No additional management support is needed unless otherwise documented below in the visit note. 

## 2013-07-29 NOTE — Progress Notes (Signed)
Subjective:    Patient ID: Rick Harris, male    DOB: [birth year], 48 y.o.   MRN: [id number]  HPI  New patient to me, here to establish a PCP patient is here today for annual physical. Patient feels well in general.  Reviewed chronic medical issues and events:  asthma, intermittent - rare use of [place] MDI dyslipidemia (never on rx)  Past Medical History  Diagnosis Date  . Asthma   . Hypercholesteremia   . Frequent headaches    Family History  Problem Relation Age of Onset  . Alcohol abuse Mother   . Hyperlipidemia Father   . Stroke Father   . Hypertension Father   . Diabetes Father   . Lung cancer Maternal Uncle   . Heart disease Maternal Grandmother    History  Substance Use Topics  . Smoking status: Never Smoker   . Smokeless tobacco: Not on file  . Alcohol Use: Yes    Review of Systems  Constitutional: Negative for fever, activity change, appetite change, fatigue and unexpected weight change.  Respiratory: Negative for cough, chest tightness, shortness of breath and wheezing.   Cardiovascular: Negative for chest pain, palpitations and leg swelling.  Skin: Positive for wound (?wart thumb pad x 2 yrs). Negative for rash.  Neurological: Negative for dizziness, syncope, facial asymmetry, weakness, light-headedness and headaches.  Psychiatric/Behavioral: Positive for sleep disturbance (per wife due to snoring). Negative for dysphoric mood. The patient is not nervous/anxious.   All other systems reviewed and are negative.      Objective:   Physical Exam  BP 132/80  Pulse 52  Temp(Src) 98.4 F (36.9 C) (Oral)  Ht 5\' 11"  (1.803 m)  Wt 243 lb 1.9 oz (110.279 kg)  BMI 33.92 kg/m2  SpO2 97% Wt Readings from Last 3 Encounters:  07/29/13 243 lb 1.9 oz (110.279 kg)   Constitutional: he is obese, but appears well-developed and well-nourished. No distress. wife at side HENT: Head: Normocephalic and atraumatic. Ears: B TMs ok, no erythema or effusion; Nose: Nose  normal. Mouth/Throat: Oropharynx is clear and moist. No oropharyngeal exudate.  Eyes: strabismus R eye - Conjunctivae and EOM are normal. Pupils are equal, round, and reactive to light. No scleral icterus.  Neck: Normal range of motion. Neck supple. No JVD present. No thyromegaly present.  Cardiovascular: Normal rate, regular rhythm and normal heart sounds.  No murmur heard. No BLE edema. Pulmonary/Chest: Effort normal and breath sounds normal. No respiratory distress. he has no wheezes.  Abdominal: Soft. Bowel sounds are normal. he exhibits no distension. There is no tenderness. no masses GU: defer at pt pref request Musculoskeletal: Normal range of motion, no joint effusions. No gross deformities Neurological: he is alert and oriented to person, place, and time. No cranial nerve deficit. Coordination, balance, strength, speech and gait are normal.  Skin: small plantar wart L thumb pad -remaining skin is warm and dry. No rash noted. No erythema.  Psychiatric: he has a normal mood and affect. behavior is normal. Judgment and thought content normal.  No results found for this basename: WBC,  HGB,  HCT,  PLT,  GLUCOSE,  CHOL,  TRIG,  HDL,  LDLDIRECT,  LDLCALC,  ALT,  AST,  NA,  K,  CL,  CREATININE,  BUN,  CO2,  TSH,  PSA,  INR,  GLUF,  HGBA1C,  MICROALBUR    Dg Knee Complete 4 Views Left  10/19/2012   *RADIOLOGY REPORT*  Clinical Data: Pain, contusion, swelling  LEFT KNEE - COMPLETE  4+ VIEW  Comparison: None.  Findings: Soft tissue prominence anteriorly over the patellar region.  Normal alignment without fracture or effusion.  Preserved joint spaces.  No acute osseous finding.  IMPRESSION: Anterior patellar region soft tissue swelling.  No other acute finding.   Original Report Authenticated By: Jerilynn Mages. Shick, M.D.   ECG: marked sinus bradycardia at 47 beats per minute. Nonspecific QRS widening, no acute ischemia  Procedure: Liquid nitrogen cryotherapy, wart destruction Indication: War  After obtaining  verbal informed consent, including review of procedure and anticipated side effects of blistering and pain, patient agrees to proceed. Liquid nitrogen cryotherapy is applied to wart for 15 second freeze time. 15 second thaw time allowed, then repeat cryotherapy treatment for 15 seconds to same lesion. Patient tolerated the procedure well. Aftercare instructions provided.     Assessment & Plan:   CPX/v70.0 - Patient has been counseled on age-appropriate routine health concerns for screening and prevention. These are reviewed and up-to-date. Immunizations are up-to-date or declined. Labs and ECG reviewed.  Snoring. Body habitus suggestive of sleep apnea. Refer to sleep for further evaluation and treatment of this problem  Plantar wart right thumb pad. Liquid nitrogen treatment as above. Patient will call if continued problems or need for repeat therapy  Problem List Items Addressed This Visit   Snoring   Relevant Orders      Ambulatory referral to Pulmonology    Other Visit Diagnoses   Routine general medical examination at a health care facility    -  Primary    Relevant Orders       EKG 12-Lead (Completed)       Basic metabolic panel       CBC with Differential       Hepatic function panel       Lipid panel       TSH       Urinalysis, Routine w reflex microscopic       PSA

## 2013-07-29 NOTE — Addendum Note (Signed)
Addended by: Gwendolyn Grant A on: 07/29/2013 05:06 PM   Modules accepted: Orders

## 2013-07-29 NOTE — Patient Instructions (Addendum)
It was good to see you today.  We have reviewed your prior records including labs and tests today  Health Maintenance reviewed - all recommended immunizations and age-appropriate screenings are up-to-date.  Test(s) ordered today. Your results will be released to [facility] (or called to you) after review, usually within 72hours after test completion. If any changes need to be made, you will be notified at that same time.  Medications reviewed and updated, no changes recommended at this time.  we'll make referral to sleep specialist for evaluation of snoring. Our office will contact you regarding appointment(s) once made.  Please schedule followup in 12 months for annual exam and labs, call sooner if problems.  Health Maintenance A healthy lifestyle and preventative care can promote health and wellness.  Maintain regular health, dental, and eye exams.  Eat a healthy diet. Foods like vegetables, fruits, whole grains, low-fat dairy products, and lean protein foods contain the nutrients you need and are low in calories. Decrease your intake of foods high in solid fats, added sugars, and salt. Get information about a proper diet from your health care provider, if necessary.  Regular physical exercise is one of the most important things you can do for your health. Most adults should get at least 150 minutes of moderate-intensity exercise (any activity that increases your heart rate and causes you to sweat) each week. In addition, most adults need muscle-strengthening exercises on 2 or more days a week.   Maintain a healthy weight. The body mass index (BMI) is a screening tool to identify possible weight problems. It provides an estimate of body fat based on height and weight. Your health care provider can find your BMI and can help you achieve or maintain a healthy weight. For males 20 years and older:  A BMI below 18.5 is considered underweight.  A BMI of 18.5 to 24.9 is normal.  A BMI of 25 to  29.9 is considered overweight.  A BMI of 30 and above is considered obese.  Maintain normal blood lipids and cholesterol by exercising and minimizing your intake of saturated fat. Eat a balanced diet with plenty of fruits and vegetables. Blood tests for lipids and cholesterol should begin at age 5 and be repeated every 5 years. If your lipid or cholesterol levels are high, you are over age 40, or you are at high risk for heart disease, you may need your cholesterol levels checked more frequently.Ongoing high lipid and cholesterol levels should be treated with medicines if diet and exercise are not working.  If you smoke, find out from your health care provider how to quit. If you do not use tobacco, do not start.  Lung cancer screening is recommended for adults aged 1-80 years who are at high risk for developing lung cancer because of a history of smoking. A yearly low-dose CT scan of the lungs is recommended for people who have at least a 30-pack-year history of smoking and are current smokers or have quit within the past 15 years. A pack year of smoking is smoking an average of 1 pack of cigarettes a day for 1 year (for example, a 30-pack-year history of smoking could mean smoking 1 pack a day for 30 years or 2 packs a day for 15 years). Yearly screening should continue until the smoker has stopped smoking for at least 15 years. Yearly screening should be stopped for people who develop a health problem that would prevent them from having lung cancer treatment.  If you choose  to drink alcohol, do not have more than 2 drinks per day. One drink is considered to be 12 oz (360 mL) of beer, 5 oz (150 mL) of wine, or 1.5 oz (45 mL) of liquor.  Avoid the use of street drugs. Do not share needles with anyone. Ask for help if you need support or instructions about stopping the use of drugs.  High blood pressure causes heart disease and increases the risk of stroke. Blood pressure should be checked at least  every 1-2 years. Ongoing high blood pressure should be treated with medicines if weight loss and exercise are not effective.  If you are 79-23 years old, ask your health care provider if you should take aspirin to prevent heart disease.  Diabetes screening involves taking a blood sample to check your fasting blood sugar level. This should be done once every 3 years after age 91 if you are at a normal weight and without risk factors for diabetes. Testing should be considered at a younger age or be carried out more frequently if you are overweight and have at least 1 risk factor for diabetes.  Colorectal cancer can be detected and often prevented. Most routine colorectal cancer screening begins at the age of 31 and continues through age 46. However, your health care provider may recommend screening at an earlier age if you have risk factors for colon cancer. On a yearly basis, your health care provider may provide home test kits to check for hidden blood in the stool. A small camera at the end of a tube may be used to directly examine the colon (sigmoidoscopy or colonoscopy) to detect the earliest forms of colorectal cancer. Talk to your health care provider about this at age 62 when routine screening begins. A direct exam of the colon should be repeated every 5-10 years through age 46, unless early forms of precancerous polyps or small growths are found.  People who are at an increased risk for hepatitis B should be screened for this virus. You are considered at high risk for hepatitis B if:  You were born in a country where hepatitis B occurs often. Talk with your health care provider about which countries are considered high risk.  Your parents were born in a high-risk country and you have not received a shot to protect against hepatitis B (hepatitis B vaccine).  You have HIV or AIDS.  You use needles to inject street drugs.  You live with, or have sex with, someone who has hepatitis B.  You are  a man who has sex with other men (MSM).  You get hemodialysis treatment.  You take certain medicines for conditions like cancer, organ transplantation, and autoimmune conditions.  Hepatitis C blood testing is recommended for all people born from [birth year] through 1965 and any individual with known risk factors for hepatitis C.  Healthy men should no longer receive prostate-specific antigen (PSA) blood tests as part of routine cancer screening. Talk to your health care provider about prostate cancer screening.  Testicular cancer screening is not recommended for adolescents or adult males who have no symptoms. Screening includes self-exam, a health care provider exam, and other screening tests. Consult with your health care provider about any symptoms you have or any concerns you have about testicular cancer.  Practice safe sex. Use condoms and avoid high-risk sexual practices to reduce the spread of sexually transmitted infections (STIs).  You should be screened for STIs, including gonorrhea and chlamydia if:  You are sexually active  and are younger than 24 years.  You are older than 24 years, and your health care provider tells you that you are at risk for this type of infection.  Your sexual activity has changed since you were last screened, and you are at an increased risk for chlamydia or gonorrhea. Ask your health care provider if you are at risk.  If you are at risk of being infected with HIV, it is recommended that you take a prescription medicine daily to prevent HIV infection. This is called pre-exposure prophylaxis (PrEP). You are considered at risk if:  You are a man who has sex with other men (MSM).  You are a heterosexual man who is sexually active with multiple partners.  You take drugs by injection.  You are sexually active with a partner who has HIV.  Talk with your health care provider about whether you are at high risk of being infected with HIV. If you choose to begin  PrEP, you should first be tested for HIV. You should then be tested every 3 months for as long as you are taking PrEP.  Use sunscreen. Apply sunscreen liberally and repeatedly throughout the day. You should seek shade when your shadow is shorter than you. Protect yourself by wearing long sleeves, pants, a wide-brimmed hat, and sunglasses year round whenever you are outdoors.  Tell your health care provider of new moles or changes in moles, especially if there is a change in shape or color. Also, tell your health care provider if a mole is larger than the size of a pencil eraser.  A one-time screening for abdominal aortic aneurysm (AAA) and surgical repair of large AAAs by ultrasound is recommended for men aged 61-75 years who are current or former smokers.  Stay current with your vaccines (immunizations). Document Released: 07/26/2007 Document Revised: 02/01/2013 Document Reviewed: 06/24/2010 [facility] Patient Information 2015 [place], [place]. This information is not intended to replace advice given to you by your health care provider. Make sure you discuss any questions you have with your health care provider.

## 2013-08-04 ENCOUNTER — Ambulatory Visit: Payer: Self-pay | Admitting: Internal Medicine

## 2013-08-16 ENCOUNTER — Other Ambulatory Visit: Payer: Self-pay | Admitting: *Deleted

## 2013-08-16 MED ORDER — PRAVASTATIN SODIUM 20 MG PO TABS: 20.0000 mg | ORAL_TABLET | Freq: Every day | ORAL | Status: DC

## 2013-08-16 NOTE — Telephone Encounter (Signed)
Left msg on triage needing pt cholesterol med sent to [facility] instead of [facility]. Called wife back inform her we resent to [place]...Johny Chess

## 2013-09-16 ENCOUNTER — Encounter: Payer: Self-pay | Admitting: Pulmonary Disease

## 2013-09-16 ENCOUNTER — Ambulatory Visit (INDEPENDENT_AMBULATORY_CARE_PROVIDER_SITE_OTHER): Payer: 59 | Admitting: Pulmonary Disease

## 2013-09-16 ENCOUNTER — Encounter (INDEPENDENT_AMBULATORY_CARE_PROVIDER_SITE_OTHER): Payer: Self-pay

## 2013-09-16 VITALS — BP 112/68 | HR 58 | Temp 98.1°F | Ht 69.0 in | Wt 238.2 lb

## 2013-09-16 DIAGNOSIS — G4733 Obstructive sleep apnea (adult) (pediatric): Secondary | ICD-10-CM

## 2013-09-16 NOTE — Assessment & Plan Note (Signed)
The patient's history is very suggestive of clinically significant sleep apnea. I have had a long discussion with him about the pathophysiology of sleep apnea, including its impact to his quality of life and cardiovascular health. I think he needs to have a sleep study for diagnosis, and the patient is agreeable.

## 2013-09-16 NOTE — Progress Notes (Signed)
Subjective:    Patient ID: Rick Harris, male    DOB: June 09, 1965, 48 y.o.   MRN: [id number]  HPI The patient is a 48 year old male who I've been asked to see for possible obstructive sleep apnea. He has been noted to have loud snoring as well as an abnormal breathing pattern by his bed partner. She has also noticed choking arousals, most of which he is unaware. He has frequent awakenings during the night, and is unrested at least 50% of the mornings. However, he does not get a lot of sleep each night. The patient does note some sleep pressure during the day with inactivity, especially after 1 PM. He will fall asleep in the evenings watching television or movies, but denies any sleepiness issues with driving. He states that his weight is up 10 pounds over the last 2 years, and his Epworth score today is 12.   Sleep Questionnaire What time do you typically go to bed?( Between what hours) 2-3am 2-3am at 1334 on 09/16/13 by Lilli Few, CMA How long does it take you to fall asleep? 30 minutes 30 minutes at 1334 on 09/16/13 by Lilli Few, CMA How many times during the night do you wake up? 3 3 at 1334 on 09/16/13 by Lilli Few, CMA What time do you get out of bed to start your day? 0830 0830 at 1334 on 09/16/13 by Lilli Few, CMA Do you drive or operate heavy machinery in your occupation? No No at 1334 on 09/16/13 by Lilli Few, CMA How much has your weight changed (up or down) over the past two years? (In pounds) 10 lb (4.536 kg) 10 lb (4.536 kg) at 1334 on 09/16/13 by Lilli Few, CMA Have you ever had a sleep study before? No No at 1334 on 09/16/13 by Lilli Few, CMA Do you currently use CPAP? No No at 1334 on 09/16/13 by Lilli Few, CMA Do you wear oxygen at any time? No No at 1334 on 09/16/13 by Lilli Few, CMA   Review of Systems  Constitutional: Negative for fever and unexpected weight change.    HENT: Negative for congestion, dental problem, ear pain, nosebleeds, postnasal drip, rhinorrhea, sinus pressure, sneezing, sore throat and trouble swallowing.   Eyes: Negative for redness and itching.  Respiratory: Positive for shortness of breath. Negative for cough, chest tightness and wheezing.   Cardiovascular: Positive for palpitations. Negative for leg swelling.  Gastrointestinal: Negative for nausea and vomiting.  Genitourinary: Negative for dysuria.  Musculoskeletal: Negative for joint swelling.  Skin: Negative for rash.  Neurological: Negative for headaches.  Hematological: Does not bruise/bleed easily.  Psychiatric/Behavioral: Negative for dysphoric mood. The patient is not nervous/anxious.        Objective:   Physical Exam Constitutional:  Overweight male, no acute distress  HENT:  Nares patent without discharge, mild septal deviation to the left with narrowing.  Oropharynx without exudate, palate and uvula are moderately elongated and thickened.  Eyes:  Perrla, eomi, no scleral icterus  Neck:  No JVD, no TMG  Cardiovascular:  Normal rate, regular rhythm, no rubs or gallops.  No murmurs        Intact distal pulses  Pulmonary :  Normal breath sounds, no stridor or respiratory distress   No rales, rhonchi, or wheezing  Abdominal:  Soft, nondistended, bowel sounds present.  No tenderness noted.   Musculoskeletal:  No lower extremity edema noted.  Lymph Nodes:  No cervical lymphadenopathy noted  Skin:  No cyanosis noted  Neurologic:  Alert, appropriate, moves all 4 extremities without obvious deficit.         Assessment & Plan:

## 2013-09-16 NOTE — Patient Instructions (Signed)
Will schedule for a sleep study at the sleep center, and arrange for followup once the results are available. Work on weight loss.

## 2013-10-27 ENCOUNTER — Ambulatory Visit (HOSPITAL_BASED_OUTPATIENT_CLINIC_OR_DEPARTMENT_OTHER): Payer: 59 | Attending: Pulmonary Disease

## 2013-10-27 VITALS — Ht 69.0 in | Wt 238.0 lb

## 2013-10-27 DIAGNOSIS — G4733 Obstructive sleep apnea (adult) (pediatric): Secondary | ICD-10-CM | POA: Diagnosis present

## 2013-11-10 DIAGNOSIS — G4730 Sleep apnea, unspecified: Secondary | ICD-10-CM

## 2013-11-10 NOTE — Sleep Study (Signed)
   NAME: Rick Harris DATE OF BIRTH:  [birth year] MEDICAL RECORD NUMBER [id number]  LOCATION: [facility]  PHYSICIAN: [place] OF STUDY: 10/27/2013  SLEEP STUDY TYPE: Nocturnal Polysomnogram               REFERRING PHYSICIAN: Clance, Armando Reichert, MD  INDICATION FOR STUDY: Hypersomnia with sleep apnea  EPWORTH SLEEPINESS SCORE:  7 HEIGHT: 5\' 9"  (175.3 cm)  WEIGHT: 238 lb (107.956 kg)    Body mass index is 35.13 kg/(m^2).  NECK SIZE: 16.5 in.  MEDICATIONS: Reviewed in the sleep record  SLEEP ARCHITECTURE:  The patient had a total sleep time of 301 minutes with no slow-wave sleep and only 21 minutes of REM. Sleep onset latency was normal at 22 minutes, and REM onset was normal as well. Sleep efficiency was mildly reduced at 81%.  RESPIRATORY DATA: The patient was found to have 39 apneas and 35 obstructive hypopneas, giving him an AHI of 15 events per hour. The events occurred primarily in the supine position, and there was loud snoring noted throughout.  OXYGEN DATA: There was transient oxygen desaturation as low as 77% with the patient's obstructive events  CARDIAC DATA: No clinically significant arrhythmias were noted  MOVEMENT/PARASOMNIA: No periodic limb movements or other abnormal behaviors were seen.  IMPRESSION/ RECOMMENDATION:    1) mild obstructive sleep apnea/hypopnea syndrome, with an AHI of 15 events per hour and oxygen desaturation as low as 77%. Treatment for this degree of sleep apnea can include a trial of weight loss alone, upper airway surgery, dental appliance, and also CPAP. Clinical correlation is suggested.    [place], American Board of Sleep Medicine  ELECTRONICALLY SIGNED ON:  11/10/2013, 2:04 PM [place] PH: [phone]   FX: [phone] [place]

## 2013-11-10 NOTE — Progress Notes (Signed)
Called spoke with spouse. appt schedule for tomorrow 10/2. nothing further needed

## 2013-11-10 NOTE — Progress Notes (Signed)
Pt needs ov to review sleep study.  Thanks.  

## 2013-11-11 ENCOUNTER — Ambulatory Visit: Payer: Self-pay | Admitting: Pulmonary Disease

## 2013-11-24 ENCOUNTER — Ambulatory Visit (INDEPENDENT_AMBULATORY_CARE_PROVIDER_SITE_OTHER): Payer: 59 | Admitting: Internal Medicine

## 2013-11-24 ENCOUNTER — Encounter: Payer: Self-pay | Admitting: Internal Medicine

## 2013-11-24 VITALS — BP 122/88 | HR 55 | Temp 98.1°F | Ht 69.0 in | Wt 246.0 lb

## 2013-11-24 DIAGNOSIS — E785 Hyperlipidemia, unspecified: Secondary | ICD-10-CM

## 2013-11-24 DIAGNOSIS — Z5181 Encounter for therapeutic drug level monitoring: Secondary | ICD-10-CM

## 2013-11-24 DIAGNOSIS — G4733 Obstructive sleep apnea (adult) (pediatric): Secondary | ICD-10-CM

## 2013-11-24 DIAGNOSIS — D509 Iron deficiency anemia, unspecified: Secondary | ICD-10-CM | POA: Insufficient documentation

## 2013-11-24 NOTE — Progress Notes (Signed)
Subjective:    Patient ID: Rick Harris, male    DOB: 1965/11/14, 48 y.o.   MRN: [id number]  HPI  Patient here for follow up Reviewed chronic medical issues and interval medical events  Past Medical History  Diagnosis Date  . Asthma   . Hypercholesteremia   . Frequent headaches     Review of Systems  Constitutional: Negative for fever and fatigue.  Respiratory: Negative for cough and shortness of breath.   Cardiovascular: Negative for chest pain and leg swelling.  Neurological: Positive for numbness ("tingle", positionally induced in L arm and hand).  Psychiatric/Behavioral: Positive for sleep disturbance (snoring, ongoing eval for OSA).       Objective:   Physical Exam  BP 122/88  Pulse 55  Temp(Src) 98.1 F (36.7 C) (Oral)  Ht 5\' 9"  (1.753 m)  Wt 246 lb (111.585 kg)  BMI 36.31 kg/m2  SpO2 98% Wt Readings from Last 3 Encounters:  11/24/13 246 lb (111.585 kg)  10/27/13 238 lb (107.956 kg)  09/16/13 238 lb 3.2 oz (108.047 kg)   Constitutional: he is overweight, appears well-developed and well-nourished. No distress.  Neck: Normal range of motion. Neck supple. No JVD present. No thyromegaly present.  Cardiovascular: Normal rate, regular rhythm and normal heart sounds.  No murmur heard. No BLE edema. Pulmonary/Chest: Effort normal and breath sounds normal. No respiratory distress. he has no wheezes.  Psychiatric: he has a normal mood and affect. His behavior is normal. Judgment and thought content normal.   Lab Results  Component Value Date   WBC 7.9 07/29/2013   HGB 12.9* 07/29/2013   HCT 40.4 07/29/2013   PLT 253.0 07/29/2013   GLUCOSE 88 07/29/2013   CHOL 266* 07/29/2013   TRIG 187.0* 07/29/2013   HDL 54.50 07/29/2013   LDLCALC 174* 07/29/2013   ALT 41 07/29/2013   AST 39* 07/29/2013   NA 139 07/29/2013   K 4.7 07/29/2013   CL 104 07/29/2013   CREATININE 1.1 07/29/2013   BUN 15 07/29/2013   CO2 28 07/29/2013   TSH 1.84 07/29/2013   PSA 0.32 07/29/2013    Dg Knee  Complete 4 Views Left  10/19/2012   *RADIOLOGY REPORT*  Clinical Data: Pain, contusion, swelling  LEFT KNEE - COMPLETE 4+ VIEW  Comparison: None.  Findings: Soft tissue prominence anteriorly over the patellar region.  Normal alignment without fracture or effusion.  Preserved joint spaces.  No acute osseous finding.  IMPRESSION: Anterior patellar region soft tissue swelling.  No other acute finding.   Original Report Authenticated By: Jerilynn Mages. Annamaria Boots, M.D.       Assessment & Plan:   Problem List Items Addressed This Visit   Hyperlipidemia - Primary     rx'd prava 07/2013 - has taken intermittently Reviewed importance of risk reduction with better chol control Check lipids when fasting with LFTs to monitor for side effects  The patient is asked to make an attempt to improve diet and exercise patterns to aid in medical management of this problem.     Relevant Orders      Lipid panel   Microcytic anemia     Reports hx same - "g-ma would given me iron pills when i got weak" Recheck CBC with ferritin    Relevant Orders      CBC with Differential      Ferritin   OSA (obstructive sleep apnea)     eval by sleep (clance) 09/2013 for same - sleep study done 10/2013 Scheduled for follow  up with Clance 12/06/13     Other Visit Diagnoses   Encounter for therapeutic drug level monitoring        Relevant Orders       Hepatic function panel

## 2013-11-24 NOTE — Progress Notes (Signed)
Pre visit review using our clinic review tool, if applicable. No additional management support is needed unless otherwise documented below in the visit note. 

## 2013-11-24 NOTE — Patient Instructions (Addendum)
It was good to see you today.  We have reviewed your prior records including labs and tests today  Test(s) ordered today for Oct 27 (before your visit with Dr. Gwenette Greet) Your results will be released to MyChart (or called to you) after review, usually within 72hours after test completion. If any changes need to be made, you will be notified at that same time.  Medications reviewed and updated, no changes recommended at this time.  Please schedule followup in 12 months, call sooner if problems. Cholesterol Cholesterol is a white, waxy, fat-like substance needed by your body in small amounts. The liver makes all the cholesterol you need. Cholesterol is carried from the liver by the blood through the blood vessels. Deposits of cholesterol (plaque) may build up on blood vessel walls. These make the arteries narrower and stiffer. Cholesterol plaques increase the risk for heart attack and stroke.  You cannot feel your cholesterol level even if it is very high. The only way to know it is high is with a blood test. Once you know your cholesterol levels, you should keep a record of the test results. Work with your health care provider to keep your levels in the desired range.  WHAT DO THE RESULTS MEAN?  Total cholesterol is a rough measure of all the cholesterol in your blood.   LDL is the so-called bad cholesterol. This is the type that deposits cholesterol in the walls of the arteries. You want this level to be low.   HDL is the good cholesterol because it cleans the arteries and carries the LDL away. You want this level to be high.  Triglycerides are fat that the body can either burn for energy or store. High levels are closely linked to heart disease.  WHAT ARE THE DESIRED LEVELS OF CHOLESTEROL?  Total cholesterol below 200.   LDL below 100 for people at risk, below 70 for those at very high risk.   HDL above 50 is good, above 60 is best.   Triglycerides below 150.  HOW CAN I LOWER MY  CHOLESTEROL?  Diet. Follow your diet programs as directed by your health care provider.   Choose fish or white meat chicken and [place], roasted or baked. Limit fatty cuts of red meat, fried foods, and processed meats, such as sausage and lunch meats.   Eat lots of fresh fruits and vegetables.  Choose whole grains, beans, pasta, potatoes, and cereals.   Use only small amounts of olive, corn, or canola oils.   Avoid butter, mayonnaise, shortening, or palm kernel oils.  Avoid foods with trans fats.   Drink skim or nonfat milk and eat low-fat or nonfat yogurt and cheeses. Avoid whole milk, cream, ice cream, egg yolks, and full-fat cheeses.   Healthy desserts include angel food cake, ginger snaps, animal crackers, hard candy, popsicles, and low-fat or nonfat frozen yogurt. Avoid pastries, cakes, pies, and cookies.   Exercise. Follow your exercise programs as directed by your health care provider.   A regular program helps decrease LDL and raise HDL.   A regular program helps with weight control.   Do things that increase your activity level like gardening, walking, or taking the stairs. Ask your health care provider about how you can be more active in your daily life.   Medicine. Take medicine only as directed by your health care provider.   Medicine may be prescribed by your health care provider to help lower cholesterol and decrease the risk for heart disease.  If you have several risk factors, you may need medicine even if your levels are normal. Document Released: 10/22/2000 Document Revised: 06/13/2013 Document Reviewed: 11/10/2012 [facility] Patient Information 2015 [place], [place]. This information is not intended to replace advice given to you by your health care provider. Make sure you discuss any questions you have with your health care provider.

## 2013-11-24 NOTE — Assessment & Plan Note (Signed)
rx'd prava 07/2013 - has taken intermittently Reviewed importance of risk reduction with better chol control Check lipids when fasting with LFTs to monitor for side effects  The patient is asked to make an attempt to improve diet and exercise patterns to aid in medical management of this problem.

## 2013-11-24 NOTE — Assessment & Plan Note (Signed)
eval by sleep (clance) 09/2013 for same - sleep study done 10/2013 Scheduled for follow up with Clance 12/06/13

## 2013-11-24 NOTE — Assessment & Plan Note (Signed)
Reports hx same - "g-ma would given me iron pills when i got weak" Recheck CBC with ferritin

## 2013-12-06 ENCOUNTER — Encounter: Payer: Self-pay | Admitting: Pulmonary Disease

## 2013-12-06 ENCOUNTER — Ambulatory Visit (INDEPENDENT_AMBULATORY_CARE_PROVIDER_SITE_OTHER): Payer: 59 | Admitting: Pulmonary Disease

## 2013-12-06 ENCOUNTER — Other Ambulatory Visit (INDEPENDENT_AMBULATORY_CARE_PROVIDER_SITE_OTHER): Payer: 59

## 2013-12-06 VITALS — BP 124/68 | HR 59 | Temp 98.0°F | Ht 70.0 in | Wt 256.0 lb

## 2013-12-06 DIAGNOSIS — D509 Iron deficiency anemia, unspecified: Secondary | ICD-10-CM

## 2013-12-06 DIAGNOSIS — G4733 Obstructive sleep apnea (adult) (pediatric): Secondary | ICD-10-CM

## 2013-12-06 DIAGNOSIS — Z5181 Encounter for therapeutic drug level monitoring: Secondary | ICD-10-CM

## 2013-12-06 DIAGNOSIS — E785 Hyperlipidemia, unspecified: Secondary | ICD-10-CM

## 2013-12-06 LAB — CBC WITH DIFFERENTIAL/PLATELET
Basophils Absolute: 0.1 10*3/uL (ref 0.0–0.1)
Eosinophils Absolute: 0.4 10*3/uL (ref 0.0–0.7)
Eosinophils Relative: 5.1 % — ABNORMAL HIGH (ref 0.0–5.0)
HCT: 41 % (ref 39.0–52.0)
Hemoglobin: 12.7 g/dL — ABNORMAL LOW (ref 13.0–17.0)
Lymphs Abs: 3.9 10*3/uL (ref 0.7–4.0)
MCHC: 31 g/dL (ref 30.0–36.0)
MCV: 74.3 fl — AB (ref 78.0–100.0)
Monocytes Absolute: 0.4 10*3/uL (ref 0.1–1.0)
Monocytes Relative: 4.2 % (ref 3.0–12.0)
Neutro Abs: 3.7 10*3/uL (ref 1.4–7.7)
Neutrophils Relative %: 43.8 % (ref 43.0–77.0)
Platelets: 234 10*3/uL (ref 150.0–400.0)
RBC: 5.51 Mil/uL (ref 4.22–5.81)
RDW: 14.4 % (ref 11.5–15.5)
WBC: 8.4 10*3/uL (ref 4.0–10.5)

## 2013-12-06 LAB — LIPID PANEL
CHOL/HDL RATIO: 5
CHOLESTEROL: 249 mg/dL — AB (ref 0–200)
HDL: 46.3 mg/dL (ref >39.00)
NONHDL: 202.7
VLDL: 89.4 mg/dL — AB (ref 0.0–40.0)

## 2013-12-06 LAB — HEPATIC FUNCTION PANEL
ALT: 43 U/L (ref 0–53)
AST: 37 U/L (ref 0–37)
Albumin: 3.4 g/dL — ABNORMAL LOW (ref 3.5–5.2)
Alkaline Phosphatase: 65 U/L (ref 39–117)
BILIRUBIN DIRECT: 0 mg/dL (ref 0.0–0.3)
Total Bilirubin: 0.4 mg/dL (ref 0.2–1.2)
Total Protein: 7.6 g/dL (ref 6.0–8.3)

## 2013-12-06 LAB — LDL CHOLESTEROL, DIRECT: Direct LDL: 151.2 mg/dL

## 2013-12-06 LAB — FERRITIN: Ferritin: 149 ng/mL (ref 22.0–322.0)

## 2013-12-06 NOTE — Assessment & Plan Note (Signed)
The patient has mild to moderate OSA by his recent sleep study, and I have discussed with him the various treatment options. One would be a conservative path with positional therapy and working on aggressive weight loss. The other would be more aggressive and would consist of either a dental appliance or C Pap while working on weight loss. This really does not represent a significant cardiovascular risk for him, and therefore the decision to treat this aggressively should hinge on its impact to his quality of life. After a long discussion, the patient would like to take the next 6 months to work aggressively on weight loss. He understands that he can call at any time if he wishes to consider more aggressive therapy, or if he is unsuccessful with weight loss.

## 2013-12-06 NOTE — Patient Instructions (Signed)
Work on weight loss, and let me know if you are not having success or wish to treat your sleep apnea more aggressively.

## 2013-12-06 NOTE — Progress Notes (Signed)
   Subjective:    Patient ID: Rick Harris, male    DOB: 1965/07/15, 48 y.o.   MRN: [id number]  HPI The patient comes in today for follow-up of his recent sleep study. He was found to have mild to moderate OSA, with an AHI of 15 events per hour and oxygen desaturation transiently as low as 77%. I have reviewed the study with him in detail, and answered all of his questions.   Review of Systems  Constitutional: Negative for fever and unexpected weight change.  HENT: Negative for congestion, dental problem, ear pain, nosebleeds, postnasal drip, rhinorrhea, sinus pressure, sneezing, sore throat and trouble swallowing.   Eyes: Negative for redness and itching.  Respiratory: Negative for cough, chest tightness, shortness of breath and wheezing.   Cardiovascular: Negative for palpitations and leg swelling.  Gastrointestinal: Negative for nausea and vomiting.  Genitourinary: Negative for dysuria.  Musculoskeletal: Negative for joint swelling.  Skin: Negative for rash.  Neurological: Negative for headaches.  Hematological: Does not bruise/bleed easily.  Psychiatric/Behavioral: Negative for dysphoric mood. The patient is not nervous/anxious.        Objective:   Physical Exam Overweight male in no acute distress Nose without purulence or discharge noted Neck without lymphadenopathy or thyromegaly Lower extremities without edema, no cyanosis Alert and oriented, moves all 4 extremities.       Assessment & Plan:

## 2013-12-12 ENCOUNTER — Emergency Department (HOSPITAL_COMMUNITY)
Admission: EM | Admit: 2013-12-12 | Discharge: 2013-12-12 | Disposition: A | Discharge status: Home / Self Care | Payer: 59 | Attending: Emergency Medicine | Admitting: Emergency Medicine

## 2013-12-12 ENCOUNTER — Encounter (HOSPITAL_COMMUNITY): Payer: Self-pay | Admitting: Emergency Medicine

## 2013-12-12 ENCOUNTER — Emergency Department (HOSPITAL_COMMUNITY): Payer: 59

## 2013-12-12 DIAGNOSIS — E780 Pure hypercholesterolemia: Secondary | ICD-10-CM | POA: Insufficient documentation

## 2013-12-12 DIAGNOSIS — Z7951 Long term (current) use of inhaled steroids: Secondary | ICD-10-CM | POA: Diagnosis not present

## 2013-12-12 DIAGNOSIS — Z79899 Other long term (current) drug therapy: Secondary | ICD-10-CM | POA: Diagnosis not present

## 2013-12-12 DIAGNOSIS — J45909 Unspecified asthma, uncomplicated: Secondary | ICD-10-CM | POA: Diagnosis not present

## 2013-12-12 DIAGNOSIS — Z791 Long term (current) use of non-steroidal anti-inflammatories (NSAID): Secondary | ICD-10-CM | POA: Diagnosis not present

## 2013-12-12 DIAGNOSIS — M25571 Pain in right ankle and joints of right foot: Secondary | ICD-10-CM | POA: Diagnosis not present

## 2013-12-12 MED ORDER — HYDROCODONE-ACETAMINOPHEN 5-325 MG PO TABS: 1.0000 | ORAL_TABLET | Freq: Four times a day (QID) | ORAL | Status: DC | PRN

## 2013-12-12 MED ORDER — KETOROLAC TROMETHAMINE 30 MG/ML IJ SOLN
30.0000 mg | Freq: Once | INTRAMUSCULAR | Status: AC
  Administered 2013-12-12: 30 mg via INTRAMUSCULAR
  Filled 2013-12-12: qty 1

## 2013-12-12 MED ORDER — IBUPROFEN 800 MG PO TABS: 800.0000 mg | ORAL_TABLET | Freq: Three times a day (TID) | ORAL | Status: AC

## 2013-12-12 NOTE — Discharge Instructions (Signed)
As discussed, your evaluation was reassuring.  In your ankle pain is likely due to either aggravation of your previous injuries or gout.  Please take all medication as directed, and be sure to follow-up with ourr orthopedist this week.  Return here for concerning changes in your condition.   Ankle Pain Ankle pain is a common symptom. The bones, cartilage, tendons, and muscles of the ankle joint perform a lot of work each day. The ankle joint holds your body weight and allows you to move around. Ankle pain can occur on either side or back of 1 or both ankles. Ankle pain may be sharp and burning or dull and aching. There may be tenderness, stiffness, redness, or warmth around the ankle. The pain occurs more often when a person walks or puts pressure on the ankle. CAUSES  There are many reasons ankle pain can develop. It is important to work with your caregiver to identify the cause since many conditions can impact the bones, cartilage, muscles, and tendons. Causes for ankle pain include:  Injury, including a break (fracture), sprain, or strain often due to a fall, sports, or a high-impact activity.  Swelling (inflammation) of a tendon (tendonitis).  Achilles tendon rupture.  Ankle instability after repeated sprains and strains.  Poor foot alignment.  Pressure on a nerve (tarsal tunnel syndrome).  Arthritis in the ankle or the lining of the ankle.  Crystal formation in the ankle (gout or pseudogout). DIAGNOSIS  A diagnosis is based on your medical history, your symptoms, results of your physical exam, and results of diagnostic tests. Diagnostic tests may include X-ray exams or a computerized magnetic scan (magnetic resonance imaging, MRI). TREATMENT  Treatment will depend on the cause of your ankle pain and may include:  Keeping pressure off the ankle and limiting activities.  Using crutches or other walking support (a cane or brace).  Using rest, ice, compression, and  elevation.  Participating in physical therapy or home exercises.  Wearing shoe inserts or special shoes.  Losing weight.  Taking medications to reduce pain or swelling or receiving an injection.  Undergoing surgery. HOME CARE INSTRUCTIONS   Only take over-the-counter or prescription medicines for pain, discomfort, or fever as directed by your caregiver.  Put ice on the injured area.  Put ice in a plastic bag.  Place a towel between your skin and the bag.  Leave the ice on for 15-20 minutes at a time, 03-04 times a day.  Keep your leg raised (elevated) when possible to lessen swelling.  Avoid activities that cause ankle pain.  Follow specific exercises as directed by your caregiver.  Record how often you have ankle pain, the location of the pain, and what it feels like. This information may be helpful to you and your caregiver.  Ask your caregiver about returning to work or sports and whether you should drive.  Follow up with your caregiver for further examination, therapy, or testing as directed. SEEK MEDICAL CARE IF:   Pain or swelling continues or worsens beyond 1 week.  You have an oral temperature above 102 F (38.9 C).  You are feeling unwell or have chills.  You are having an increasingly difficult time with walking.  You have loss of sensation or other new symptoms.  You have questions or concerns. MAKE SURE YOU:   Understand these instructions.  Will watch your condition.  Will get help right away if you are not doing well or get worse. Document Released: 07/17/2009 Document Revised: 04/21/2011 Document  Reviewed: 07/17/2009 [employer] Patient Information 2015 [place], [place]. This information is not intended to replace advice given to you by your health care provider. Make sure you discuss any questions you have with your health care provider.

## 2013-12-12 NOTE — ED Notes (Signed)
Pt states that he woke up yesterday morning and unable to bear weight on his right foot due to pain on ankle and lateral side of foot.  Pt states this morning he has been able to bear a little weight, but still having pain.

## 2013-12-12 NOTE — ED Provider Notes (Signed)
CSN: [id number]     Arrival date & time 12/12/13  0913 History   First MD Initiated Contact with Patient 12/12/13 [phone]     Chief Complaint  Patient presents with  . Foot Pain      HPI  Patient presents with concerns of new ankle pain.  Pain began yesterday, without clear precipitant.  Initially the pain was severe, prohibited weightbearing.  Heme interval, pain is become slightly more tolerable, and the patient can now partially bear weight on the right side. The pain is focally about the lateral aspect of the ankle, though it is diffuse, with radiation to the plantar aspect of the foot. There is no concurrent loss of sensation or strength. There is associated swelling. Symptoms have improved with ibuprofen. There is no concurrent fevers, chills, other pain or any complaints. Patient denies a history of gout or other medical problems. He does endorse a history of prior ligamentous injury to the ankle.   Past Medical History  Diagnosis Date  . Asthma   . Hypercholesteremia   . Frequent headaches    Past Surgical History  Procedure Laterality Date  . Appendectomy  2009  . Nasal sinus surgery  04/1997   Family History  Problem Relation Age of Onset  . Alcohol abuse Mother   . Hyperlipidemia Father   . Stroke Father   . Hypertension Father   . Diabetes Father   . Lung cancer Maternal Uncle   . Heart disease Maternal Grandmother    History  Substance Use Topics  . Smoking status: Never Smoker   . Smokeless tobacco: Not on file  . Alcohol Use: 3.0 oz/week    6 drink(s) per week    Review of Systems  All other systems reviewed and are negative.     Allergies  Review of patient's allergies indicates no known allergies.  Home Medications   Prior to Admission medications   Medication Sig Start Date End Date Taking? Authorizing Provider  albuterol (PROVENTIL HFA;VENTOLIN HFA) 108 (90 BASE) MCG/ACT inhaler Inhale 2 puffs into the lungs every 6 (six) hours as needed.  For shortness of breath    Historical Provider, MD  fluticasone (FLONASE) 50 MCG/ACT nasal spray Place 2 sprays into the nose daily.    Historical Provider, MD  HYDROcodone-acetaminophen (NORCO/VICODIN) 5-325 MG per tablet Take 1 tablet by mouth every 6 (six) hours as needed for severe pain. 12/12/13   Carmin Muskrat, MD  ibuprofen (ADVIL,MOTRIN) 800 MG tablet Take 1 tablet (800 mg total) by mouth 3 (three) times daily. For pain 12/12/13 12/15/13  Carmin Muskrat, MD  pravastatin (PRAVACHOL) 20 MG tablet Take 1 tablet (20 mg total) by mouth daily. 08/16/13   Rowe Clack, MD   BP 122/74 mmHg  Pulse 56  Temp(Src) 98.3 F (36.8 C) (Oral)  Resp 16  SpO2 97% Physical Exam  Constitutional: He is oriented to person, place, and time. He appears well-developed. No distress.  HENT:  Head: Normocephalic and atraumatic.  Eyes: Conjunctivae and EOM are normal.  Cardiovascular: Normal rate, regular rhythm and intact distal pulses.   Pulmonary/Chest: Effort normal. No stridor. No respiratory distress.  Abdominal: He exhibits no distension.  Musculoskeletal: He exhibits no edema.       Right knee: Normal.       Right ankle: He exhibits swelling. He exhibits normal range of motion, no ecchymosis, no deformity and normal pulse. Tenderness. Lateral malleolus tenderness found. No medial malleolus, no AITFL, no CF ligament, no posterior TFL, no  head of 5th metatarsal and no proximal fibula tenderness found. Achilles tendon normal.       Feet:  Neurological: He is alert and oriented to person, place, and time.  Skin: Skin is warm and dry.  Psychiatric: He has a normal mood and affect.  Nursing note and vitals reviewed.   ED Course  Procedures (including critical care time)   After the initial evaluation the patient had ice pack applied, received crutches.  Imaging Review Dg Ankle Complete Right  12/12/2013   CLINICAL DATA:  Generalized ankle pain and swelling without known injury; history of  right ankle sprain 4 years ago  EXAM: RIGHT ANKLE - COMPLETE 3+ VIEW  COMPARISON:  Right ankle series of August 12, 2005  FINDINGS: The ankle joint mortise is preserved. The talar dome is intact. There is no acute malleolar fracture. There is stable calcific density inferior to the tip of the lateral malleolus which is likely degenerative or may reflect remote trauma. There is mild soft tissue swelling anteriorly.  IMPRESSION: There is no acute bony abnormality of the right ankle.   Electronically Signed   By: David  Martinique   On: 12/12/2013 09:59   Dg Foot Complete Right  12/12/2013   CLINICAL DATA:  Generalized ankle pain and swelling with plantar surface pain in the foot without history of trauma; onset upon awakening today  EXAM: RIGHT FOOT COMPLETE - 3+ VIEW  COMPARISON:  Right ankle series of today's date.  FINDINGS: The bones of the right foot are adequately mineralized. There is no evidence of an acute or healing fracture nor dislocation. There is no significant degenerative change. There is a tiny plantar calcaneal spur. There is mild soft tissue swelling over the anterior aspect of the ankle and hindfoot.  IMPRESSION: There is no acute bony abnormality of the right foot.   Electronically Signed   By: David  [place]   On: 12/12/2013 10:01    I reviewed the x-rays, agree with the interpretation.  MDM   Final diagnoses:  Ankle pain, right    Patient presents with new right ankle pain.  Pain is likely arthralgia secondary prior injuries, versus gout.  No suspicion for septic arthritis.  Tendons are intact.  Patient was started on anti-inflammatories, had immobilization here, will follow up with orthopedics.    Carmin Muskrat, MD 12/12/13 1034

## 2014-09-04 ENCOUNTER — Encounter (HOSPITAL_COMMUNITY): Payer: Self-pay

## 2014-09-04 ENCOUNTER — Emergency Department (HOSPITAL_COMMUNITY)
Admission: EM | Admit: 2014-09-04 | Discharge: 2014-09-04 | Disposition: A | Discharge status: Home / Self Care | Payer: Worker's Compensation | Attending: Emergency Medicine | Admitting: Emergency Medicine

## 2014-09-04 ENCOUNTER — Emergency Department (HOSPITAL_COMMUNITY): Payer: Worker's Compensation

## 2014-09-04 DIAGNOSIS — Z7951 Long term (current) use of inhaled steroids: Secondary | ICD-10-CM | POA: Insufficient documentation

## 2014-09-04 DIAGNOSIS — E780 Pure hypercholesterolemia: Secondary | ICD-10-CM | POA: Diagnosis not present

## 2014-09-04 DIAGNOSIS — S61217A Laceration without foreign body of left little finger without damage to nail, initial encounter: Secondary | ICD-10-CM | POA: Insufficient documentation

## 2014-09-04 DIAGNOSIS — Y998 Other external cause status: Secondary | ICD-10-CM | POA: Insufficient documentation

## 2014-09-04 DIAGNOSIS — Z23 Encounter for immunization: Secondary | ICD-10-CM | POA: Insufficient documentation

## 2014-09-04 DIAGNOSIS — J45909 Unspecified asthma, uncomplicated: Secondary | ICD-10-CM | POA: Insufficient documentation

## 2014-09-04 DIAGNOSIS — Y9289 Other specified places as the place of occurrence of the external cause: Secondary | ICD-10-CM | POA: Insufficient documentation

## 2014-09-04 DIAGNOSIS — Y93E5 Activity, floor mopping and cleaning: Secondary | ICD-10-CM | POA: Insufficient documentation

## 2014-09-04 DIAGNOSIS — Y288XXA Contact with other sharp object, undetermined intent, initial encounter: Secondary | ICD-10-CM | POA: Diagnosis not present

## 2014-09-04 DIAGNOSIS — Z79899 Other long term (current) drug therapy: Secondary | ICD-10-CM | POA: Diagnosis not present

## 2014-09-04 DIAGNOSIS — S61219A Laceration without foreign body of unspecified finger without damage to nail, initial encounter: Secondary | ICD-10-CM

## 2014-09-04 MED ORDER — LIDOCAINE HCL (PF) 1 % IJ SOLN
5.0000 mL | Freq: Once | INTRAMUSCULAR | Status: AC
  Administered 2014-09-04: 5 mL
  Filled 2014-09-04: qty 5

## 2014-09-04 MED ORDER — TETANUS-DIPHTH-ACELL PERTUSSIS 5-2.5-18.5 LF-MCG/0.5 IM SUSP
0.5000 mL | Freq: Once | INTRAMUSCULAR | Status: AC
  Administered 2014-09-04: 0.5 mL via INTRAMUSCULAR
  Filled 2014-09-04: qty 0.5

## 2014-09-04 NOTE — ED Notes (Signed)
Extremity wrapped with non-adherent dressing.

## 2014-09-04 NOTE — ED Notes (Signed)
workmans comp being completed by trained staff

## 2014-09-04 NOTE — Discharge Instructions (Signed)
Laceration Care, Adult °A laceration is a cut or lesion that goes through all layers of the skin and into the tissue just beneath the skin. °TREATMENT  °Some lacerations may not require closure. Some lacerations may not be able to be closed due to an increased risk of infection. It is important to see your caregiver as soon as possible after an injury to minimize the risk of infection and maximize the opportunity for successful closure. °If closure is appropriate, pain medicines may be given, if needed. The wound will be cleaned to help prevent infection. Your caregiver will use stitches (sutures), staples, wound glue (adhesive), or skin adhesive strips to repair the laceration. These tools bring the skin edges together to allow for faster healing and a better cosmetic outcome. However, all wounds will heal with a scar. Once the wound has healed, scarring can be minimized by covering the wound with sunscreen during the day for 1 full year. °HOME CARE INSTRUCTIONS  °For sutures or staples: °· Keep the wound clean and dry. °· If you were given a bandage (dressing), you should change it at least once a day. Also, change the dressing if it becomes wet or dirty, or as directed by your caregiver. °· Wash the wound with soap and water 2 times a day. Rinse the wound off with water to remove all soap. Pat the wound dry with a clean towel. °· After cleaning, apply a thin layer of the antibiotic ointment as recommended by your caregiver. This will help prevent infection and keep the dressing from sticking. °· You may shower as usual after the first 24 hours. Do not soak the wound in water until the sutures are removed. °· Only take over-the-counter or prescription medicines for pain, discomfort, or fever as directed by your caregiver. °· Get your sutures or staples removed as directed by your caregiver. °For skin adhesive strips: °· Keep the wound clean and dry. °· Do not get the skin adhesive strips wet. You may bathe  carefully, using caution to keep the wound dry. °· If the wound gets wet, pat it dry with a clean towel. °· Skin adhesive strips will fall off on their own. You may trim the strips as the wound heals. Do not remove skin adhesive strips that are still stuck to the wound. They will fall off in time. °For wound adhesive: °· You may briefly wet your wound in the shower or bath. Do not soak or scrub the wound. Do not swim. Avoid periods of heavy perspiration until the skin adhesive has fallen off on its own. After showering or bathing, gently pat the wound dry with a clean towel. °· Do not apply liquid medicine, cream medicine, or ointment medicine to your wound while the skin adhesive is in place. This may loosen the film before your wound is healed. °· If a dressing is placed over the wound, be careful not to apply tape directly over the skin adhesive. This may cause the adhesive to be pulled off before the wound is healed. °· Avoid prolonged exposure to sunlight or tanning lamps while the skin adhesive is in place. Exposure to ultraviolet light in the first year will darken the scar. °· The skin adhesive will usually remain in place for 5 to 10 days, then naturally fall off the skin. Do not pick at the adhesive film. °You may need a tetanus shot if: °· You cannot remember when you had your last tetanus shot. °· You have never had a tetanus   shot. If you get a tetanus shot, your arm may swell, get red, and feel warm to the touch. This is common and not a problem. If you need a tetanus shot and you choose not to have one, there is a rare chance of getting tetanus. Sickness from tetanus can be serious. SEEK MEDICAL CARE IF:   You have redness, swelling, or increasing pain in the wound.  You see a red line that goes away from the wound.  You have yellowish-white fluid (pus) coming from the wound.  You have a fever.  You notice a bad smell coming from the wound or dressing.  Your wound breaks open before or  after sutures have been removed.  You notice something coming out of the wound such as wood or glass.  Your wound is on your hand or foot and you cannot move a finger or toe. SEEK IMMEDIATE MEDICAL CARE IF:   Your pain is not controlled with prescribed medicine.  You have severe swelling around the wound causing pain and numbness or a change in color in your arm, hand, leg, or foot.  Your wound splits open and starts bleeding.  You have worsening numbness, weakness, or loss of function of any joint around or beyond the wound.  You develop painful lumps near the wound or on the skin anywhere on your body. MAKE SURE YOU:   Understand these instructions.  Will watch your condition.  Will get help right away if you are not doing well or get worse. Document Released: 01/27/2005 Document Revised: 04/21/2011 Document Reviewed: 07/23/2010 [facility] Patient Information 2015 [place], [place]. This information is not intended to replace advice given to you by your health care provider. Make sure you discuss any questions you have with your health care provider. Dressing Change A dressing is a material placed over wounds. It keeps the wound clean, dry, and protected from further injury. This provides an environment that favors wound healing.  BEFORE YOU BEGIN  Get your supplies together. Things you may need include:  Saline solution.  Flexible gauze dressing.  Medicated cream.  Tape.  Gloves.  Abdominal dressing pads.  Gauze squares.  Plastic bags.  Take pain medicine 30 minutes before the dressing change if you need it.  Take a shower before you do the first dressing change of the day. Use plastic wrap or a plastic bag to prevent the dressing from getting wet. REMOVING YOUR OLD DRESSING   Wash your hands with soap and water. Dry your hands with a clean towel.  Put on your gloves.  Remove any tape.  Carefully remove the old dressing. If the dressing sticks, you may dampen  it with warm water to loosen it, or follow your caregiver's specific directions.  Remove any gauze or packing tape that is in your wound.  Take off your gloves.  Put the gloves, tape, gauze, or any packing tape into a plastic bag. CHANGING YOUR DRESSING  Open the supplies.  Take the cap off the saline solution.  Open the gauze package so that the gauze remains on the inside of the package.  Put on your gloves.  Clean your wound as told by your caregiver.  If you have been told to keep your wound dry, follow those instructions.  Your caregiver may tell you to do one or more of the following:  Pick up the gauze. Pour the saline solution over the gauze. Squeeze out the extra saline solution.  Put medicated cream or other medicine on  your wound if you have been told to do so.  Put the solution soaked gauze only in your wound, not on the skin around it.  Pack your wound loosely or as told by your caregiver.  Put dry gauze on your wound.  Put abdominal dressing pads over the dry gauze if your wet gauze soaks through.  Tape the abdominal dressing pads in place so they will not fall off. Do not wrap the tape completely around the affected part (arm, leg, abdomen).  Wrap the dressing pads with a flexible gauze dressing to secure it in place.  Take off your gloves. Put them in the plastic bag with the old dressing. Tie the bag shut and throw it away.  Keep the dressing clean and dry until your next dressing change.  Wash your hands. SEEK MEDICAL CARE IF:  Your skin around the wound looks red.  Your wound feels more tender or sore.  You see pus in the wound.  Your wound smells bad.  You have a fever.  Your skin around the wound has a rash that itches and burns.  You see black or yellow skin in your wound that was not there before.  You feel nauseous, throw up, and feel very tired. Document Released: 03/06/2004 Document Revised: 04/21/2011 Document Reviewed:  12/09/2010 [facility] Patient Information 2015 [place], [place]. This information is not intended to replace advice given to you by your health care provider. Make sure you discuss any questions you have with your health care provider.

## 2014-09-04 NOTE — ED Provider Notes (Signed)
CSN: [id number]     Arrival date & time 09/04/14  1054 History  This chart was scribed for non-physician practitioner, Waynetta Pean, PA-C, working with Dorie Rank, MD by Ladene Artist, ED Scribe. This patient was seen in room WTR6/WTR6 and the patient's care was started at 12:09 PM.   Chief Complaint  Patient presents with  . Finger Laceration    The history is provided by the patient. No language interpreter was used.   HPI Comments: Rick Harris is a 49 y.o. male who presents to the Emergency Department complaining of a laceration sustained to left fifth digit PTA. 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 He reports constant, 4/10 pain that is exacerbated with palpation. Bleeding is controlled at this time. He denies numbness/tingling and any other injuries. Pt's last tetanus was approximately 6 years ago.   Past Medical History  Diagnosis Date  . Asthma   . Hypercholesteremia   . Frequent headaches    Past Surgical History  Procedure Laterality Date  . Appendectomy  2009  . Nasal sinus surgery  04/1997   Family History  Problem Relation Age of Onset  . Alcohol abuse Mother   . Hyperlipidemia Father   . Stroke Father   . Hypertension Father   . Diabetes Father   . Lung cancer Maternal Uncle   . Heart disease Maternal Grandmother    History  Substance Use Topics  . Smoking status: Never Smoker   . Smokeless tobacco: Not on file  . Alcohol Use: 3.0 oz/week    6 drink(s) per week    Review of Systems  Constitutional: Negative for fever and chills.  Skin: Positive for wound.  Neurological: Negative for numbness.   Allergies  Review of patient's allergies indicates no known allergies.  Home Medications   Prior to Admission medications   Medication Sig Start Date End Date Taking? Authorizing Provider  albuterol (PROVENTIL HFA;VENTOLIN HFA) 108 (90 BASE) MCG/ACT inhaler Inhale 2 puffs into the lungs every 6 (six)  hours as needed for wheezing (wheezing). For shortness of breath    Historical Provider, MD  fluticasone (FLONASE) 50 MCG/ACT nasal spray Place 2 sprays into the nose daily.    Historical Provider, MD  HYDROcodone-acetaminophen (NORCO/VICODIN) 5-325 MG per tablet Take 1 tablet by mouth every 6 (six) hours as needed for severe pain. 12/12/13   Carmin Muskrat, MD  ibuprofen (ADVIL,MOTRIN) 200 MG tablet Take 400 mg by mouth every 6 (six) hours as needed for moderate pain (pain).    Historical Provider, MD  pravastatin (PRAVACHOL) 20 MG tablet Take 1 tablet (20 mg total) by mouth daily. 08/16/13   Rowe Clack, MD   BP 157/86 mmHg  Pulse 78  Temp(Src) 98.4 F (36.9 C) (Oral)  Resp 16  SpO2 98% Physical Exam  Constitutional: He appears well-developed and well-nourished. No distress.  Nontoxic appearing.  HENT:  Head: Normocephalic and atraumatic.  Eyes: Right eye exhibits no discharge. Left eye exhibits no discharge.  Pulmonary/Chest: Effort normal. No respiratory distress.  Musculoskeletal: He exhibits no tenderness.  Neurological: He is alert. Coordination normal.  No numbness or weakness surrounding or distal to the wound.   Skin: Skin is warm and dry. No rash noted. He is not diaphoretic.  1 cm superficial laceration to distal 5th L fingertip. No evidence of foreign body or tendon involvement. Bleeding is controlled.  Psychiatric: He has a normal mood and affect.  His behavior is normal.  Nursing note and vitals reviewed.  ED Course  LACERATION REPAIR Date/Time: 09/04/2014 12:50 PM Performed by: Waynetta Pean Authorized by: Waynetta Pean Consent: Verbal consent obtained. Risks and benefits: risks, benefits and alternatives were discussed Consent given by: patient Patient understanding: patient states understanding of the procedure being performed Patient consent: the patient's understanding of the procedure matches consent given Procedure consent: procedure consent matches  procedure scheduled Relevant documents: relevant documents present and verified Test results: test results available and properly labeled Site marked: the operative site was marked Imaging studies: imaging studies available Required items: required blood products, implants, devices, and special equipment available Patient identity confirmed: verbally with patient Body area: upper extremity Location details: left small finger Laceration length: 1 cm Foreign bodies: no foreign bodies Tendon involvement: none Nerve involvement: none Vascular damage: no Anesthesia: digital block Local anesthetic: lidocaine 1% without epinephrine Anesthetic total: 2 ml Patient sedated: no Preparation: Patient was prepped and draped in the usual sterile fashion. Irrigation solution: saline Irrigation method: jet lavage Amount of cleaning: extensive Debridement: none Degree of undermining: none Skin closure: 4-0 Prolene Number of sutures: 3 Technique: simple Approximation: close Approximation difficulty: simple Dressing: non-adhesive packing strip Patient tolerance: Patient tolerated the procedure well with no immediate complications   (including critical care time) DIAGNOSTIC STUDIES: Oxygen Saturation is 98% on RA, normal by my interpretation.    COORDINATION OF CARE: 12:12 PM-Discussed treatment plan which includes sutures with pt at bedside and pt agreed to plan.   Labs Review Labs Reviewed - No data to display  Imaging Review Dg Finger Little Left  09/04/2014   CLINICAL DATA:  Finger laceration  EXAM: LEFT LITTLE FINGER 2+V  COMPARISON:  11/07/2006  FINDINGS: There is no evidence of fracture or dislocation. There is no evidence of arthropathy or other focal bone abnormality. Soft tissues are unremarkable.  IMPRESSION: Negative.   Electronically Signed   By: Franchot Gallo M.D.   On: 09/04/2014 11:53     EKG Interpretation None      Filed Vitals:   09/04/14 1100  BP: 157/86  Pulse:  78  Temp: 98.4 F (36.9 C)  TempSrc: Oral  Resp: 16  SpO2: 98%     MDM   Meds given in ED:  Medications  lidocaine (PF) (XYLOCAINE) 1 % injection 5 mL (5 mLs Infiltration Given 09/04/14 1220)  Tdap (BOOSTRIX) injection 0.5 mL (0.5 mLs Intramuscular Given 09/04/14 1220)    New Prescriptions   No medications on file    Final diagnoses:  Finger laceration, initial encounter   Patient presented with a left little finger laceration that he sustained while cleaning a pool cleaner basket just PTA. The patient has a 1 cm laceration to his left distal little finger. X-ray of his left little finger is unremarkable. No evidence of tendon involvement or foreign bodies. Patient's finger laceration repair by me with 3 4-0 proline sutures. The patient tolerated procedure well. Advised to return in 5-7 days to have his sutures removed. Wound care instructions provided. Tetanus shot updated in the emergency department. I advised the patient to follow-up with their primary care provider this week. I advised the patient to return to the emergency department with new or worsening symptoms or new concerns. The patient verbalized understanding and agreement with plan.    I personally performed the services described in this documentation, which was scribed in my presence. The recorded information has been reviewed and is accurate.  Waynetta Pean, PA-C 09/04/14 1309  Dorie Rank, MD 09/04/14 [phone]

## 2014-09-04 NOTE — ED Notes (Addendum)
Pt c/o L 5th digit laceration.  Pain score 5/10.  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  No thinners.  Bleeding is controlled.  Last tetanus unknown.

## 2014-09-04 NOTE — ED Notes (Signed)
PA at bedside.

## 2014-09-12 ENCOUNTER — Emergency Department (HOSPITAL_COMMUNITY)
Admission: EM | Admit: 2014-09-12 | Discharge: 2014-09-12 | Disposition: A | Discharge status: Home / Self Care | Payer: Commercial Managed Care - HMO | Attending: Physician Assistant | Admitting: Physician Assistant

## 2014-09-12 ENCOUNTER — Encounter (HOSPITAL_COMMUNITY): Payer: Self-pay | Admitting: Emergency Medicine

## 2014-09-12 DIAGNOSIS — Z7951 Long term (current) use of inhaled steroids: Secondary | ICD-10-CM | POA: Diagnosis not present

## 2014-09-12 DIAGNOSIS — Z4802 Encounter for removal of sutures: Secondary | ICD-10-CM | POA: Insufficient documentation

## 2014-09-12 DIAGNOSIS — J45909 Unspecified asthma, uncomplicated: Secondary | ICD-10-CM | POA: Diagnosis not present

## 2014-09-12 DIAGNOSIS — Z79899 Other long term (current) drug therapy: Secondary | ICD-10-CM | POA: Insufficient documentation

## 2014-09-12 DIAGNOSIS — E780 Pure hypercholesterolemia: Secondary | ICD-10-CM | POA: Insufficient documentation

## 2014-09-12 NOTE — ED Provider Notes (Signed)
CSN: [id number]     Arrival date & time 09/12/14  1120 History   First MD Initiated Contact with Patient 09/12/14 1142     Chief Complaint  Patient presents with  . Suture / Staple Removal     (Consider location/radiation/quality/duration/timing/severity/associated sxs/prior Treatment) HPI Comments: Patient presents for suture removal. Patient had laceration repaired 7 days ago to left small finger. He denies any discharge drainage. Denies any fevers chills. Denies any associated symptoms. There are no aggravating or alleviating factors.  The history is provided by the patient. No language interpreter was used.    Past Medical History  Diagnosis Date  . Asthma   . Hypercholesteremia   . Frequent headaches    Past Surgical History  Procedure Laterality Date  . Appendectomy  2009  . Nasal sinus surgery  04/1997   Family History  Problem Relation Age of Onset  . Alcohol abuse Mother   . Hyperlipidemia Father   . Stroke Father   . Hypertension Father   . Diabetes Father   . Lung cancer Maternal Uncle   . Heart disease Maternal Grandmother    History  Substance Use Topics  . Smoking status: Never Smoker   . Smokeless tobacco: Not on file  . Alcohol Use: 3.0 oz/week    6 drink(s) per week    Review of Systems  Constitutional: Negative for fever and chills.  Respiratory: Negative for shortness of breath.   Cardiovascular: Negative for chest pain.  Gastrointestinal: Negative for nausea, vomiting, diarrhea and constipation.  Genitourinary: Negative for dysuria.      Allergies  Review of patient's allergies indicates no known allergies.  Home Medications   Prior to Admission medications   Medication Sig Start Date End Date Taking? Authorizing Provider  albuterol (PROVENTIL HFA;VENTOLIN HFA) 108 (90 BASE) MCG/ACT inhaler Inhale 2 puffs into the lungs every 6 (six) hours as needed for wheezing (wheezing). For shortness of breath    Historical Provider, MD  fluticasone  (FLONASE) 50 MCG/ACT nasal spray Place 2 sprays into the nose daily.    Historical Provider, MD  HYDROcodone-acetaminophen (NORCO/VICODIN) 5-325 MG per tablet Take 1 tablet by mouth every 6 (six) hours as needed for severe pain. 12/12/13   Carmin Muskrat, MD  ibuprofen (ADVIL,MOTRIN) 200 MG tablet Take 400 mg by mouth every 6 (six) hours as needed for moderate pain (pain).    Historical Provider, MD  pravastatin (PRAVACHOL) 20 MG tablet Take 1 tablet (20 mg total) by mouth daily. 08/16/13   Rowe Clack, MD   BP 126/81 mmHg  Pulse 64  Temp(Src) 98.2 F (36.8 C) (Oral)  Resp 18  SpO2 99% Physical Exam  Constitutional: He is oriented to person, place, and time. He appears well-developed and well-nourished.  HENT:  Head: Normocephalic and atraumatic.  Eyes: Conjunctivae and EOM are normal.  Neck: Normal range of motion.  Cardiovascular: Normal rate.   Pulmonary/Chest: Effort normal.  Abdominal: He exhibits no distension.  Musculoskeletal: Normal range of motion.  Neurological: He is alert and oriented to person, place, and time.  Skin: Skin is dry.  3 sutures present to palmar surface of left 5th finger, well healing wound, no sign of infection  Psychiatric: He has a normal mood and affect. His behavior is normal. Judgment and thought content normal.  Nursing note and vitals reviewed.   ED Course  Procedures (including critical care time) Labs Review Labs Reviewed - No data to display  Imaging Review No results found.   EKG  Interpretation None       SUTURE REMOVAL Performed by: Montine Circle  Consent: Verbal consent obtained. Patient identity confirmed: provided demographic data Time out: Immediately prior to procedure a "time out" was called to verify the correct patient, procedure, equipment, support staff and site/side marked as required.  Location details: left 5th finger  Wound Appearance: clean  Sutures/Staples Removed: 3  Facility: sutures placed in  this facility Patient tolerance: Patient tolerated the procedure well with no immediate complications.     MDM   Final diagnoses:  Visit for suture removal        Montine Circle, PA-C 09/12/14 [place], MD 09/12/14 1500

## 2014-09-12 NOTE — ED Notes (Signed)
Patient states he is hear to have his sutures removed from left pinky.  Patient has no noted discharge or drainage at suture site.

## 2014-09-12 NOTE — ED Notes (Signed)
MD at bedside. 

## 2014-09-12 NOTE — Discharge Instructions (Signed)

## 2014-11-27 ENCOUNTER — Ambulatory Visit: Payer: Self-pay | Admitting: Internal Medicine

## 2014-12-05 ENCOUNTER — Other Ambulatory Visit (INDEPENDENT_AMBULATORY_CARE_PROVIDER_SITE_OTHER): Payer: Commercial Managed Care - HMO

## 2014-12-05 ENCOUNTER — Encounter: Payer: Self-pay | Admitting: Internal Medicine

## 2014-12-05 ENCOUNTER — Ambulatory Visit (INDEPENDENT_AMBULATORY_CARE_PROVIDER_SITE_OTHER): Payer: Commercial Managed Care - HMO | Admitting: Internal Medicine

## 2014-12-05 VITALS — BP 120/78 | HR 66 | Temp 98.2°F | Ht 70.0 in | Wt 258.5 lb

## 2014-12-05 DIAGNOSIS — G4733 Obstructive sleep apnea (adult) (pediatric): Secondary | ICD-10-CM | POA: Diagnosis not present

## 2014-12-05 DIAGNOSIS — Z0000 Encounter for general adult medical examination without abnormal findings: Secondary | ICD-10-CM

## 2014-12-05 DIAGNOSIS — E785 Hyperlipidemia, unspecified: Secondary | ICD-10-CM

## 2014-12-05 DIAGNOSIS — R7989 Other specified abnormal findings of blood chemistry: Secondary | ICD-10-CM

## 2014-12-05 DIAGNOSIS — E669 Obesity, unspecified: Secondary | ICD-10-CM | POA: Diagnosis not present

## 2014-12-05 LAB — HEPATIC FUNCTION PANEL
ALBUMIN: 4.2 g/dL (ref 3.5–5.2)
ALT: 39 U/L (ref 0–53)
AST: 30 U/L (ref 0–37)
Alkaline Phosphatase: 72 U/L (ref 39–117)
BILIRUBIN TOTAL: 0.2 mg/dL (ref 0.2–1.2)
Bilirubin, Direct: 0 mg/dL (ref 0.0–0.3)
Total Protein: 7.6 g/dL (ref 6.0–8.3)

## 2014-12-05 LAB — URINALYSIS, ROUTINE W REFLEX MICROSCOPIC
Bilirubin Urine: NEGATIVE
KETONES UR: NEGATIVE
Leukocytes, UA: NEGATIVE
Nitrite: NEGATIVE
Total Protein, Urine: NEGATIVE
UROBILINOGEN UA: 0.2 (ref 0.0–1.0)
Urine Glucose: NEGATIVE
pH: 6 (ref 5.0–8.0)

## 2014-12-05 LAB — CBC WITH DIFFERENTIAL/PLATELET
BASOS PCT: 0.6 % (ref 0.0–3.0)
Basophils Absolute: 0 10*3/uL (ref 0.0–0.1)
Eosinophils Absolute: 0.4 10*3/uL (ref 0.0–0.7)
Eosinophils Relative: 5.2 % — ABNORMAL HIGH (ref 0.0–5.0)
HCT: 41.8 % (ref 39.0–52.0)
Hemoglobin: 13.3 g/dL (ref 13.0–17.0)
LYMPHS ABS: 3.2 10*3/uL (ref 0.7–4.0)
Lymphocytes Relative: 42.6 % (ref 12.0–46.0)
MCHC: 31.9 g/dL (ref 30.0–36.0)
MCV: 73.3 fl — AB (ref 78.0–100.0)
MONO ABS: 0.4 10*3/uL (ref 0.1–1.0)
MONOS PCT: 5.6 % (ref 3.0–12.0)
NEUTROS ABS: 3.4 10*3/uL (ref 1.4–7.7)
NEUTROS PCT: 46 % (ref 43.0–77.0)
Platelets: 242 10*3/uL (ref 150.0–400.0)
RBC: 5.7 Mil/uL (ref 4.22–5.81)
RDW: 14.9 % (ref 11.5–15.5)
WBC: 7.5 10*3/uL (ref 4.0–10.5)

## 2014-12-05 LAB — LIPID PANEL
CHOLESTEROL: 292 mg/dL — AB (ref 0–200)
HDL: 50.5 mg/dL (ref >39.00)
NonHDL: 241.51
TRIGLYCERIDES: 335 mg/dL — AB (ref 0.0–149.0)
Total CHOL/HDL Ratio: 6
VLDL: 67 mg/dL — AB (ref 0.0–40.0)

## 2014-12-05 LAB — BASIC METABOLIC PANEL
BUN: 17 mg/dL (ref 6–23)
CHLORIDE: 104 meq/L (ref 96–112)
CO2: 28 meq/L (ref 19–32)
Calcium: 9.8 mg/dL (ref 8.4–10.5)
Creatinine, Ser: 1.12 mg/dL (ref 0.40–1.50)
GFR: 90.3 mL/min (ref >60.00)
Glucose, Bld: 101 mg/dL — ABNORMAL HIGH (ref 70–99)
POTASSIUM: 4.9 meq/L (ref 3.5–5.1)
SODIUM: 140 meq/L (ref 135–145)

## 2014-12-05 LAB — LDL CHOLESTEROL, DIRECT: LDL DIRECT: 199 mg/dL

## 2014-12-05 LAB — HIV ANTIBODY (ROUTINE TESTING W REFLEX): HIV: NONREACTIVE

## 2014-12-05 LAB — TSH: TSH: 4.06 u[IU]/mL (ref 0.35–4.50)

## 2014-12-05 MED ORDER — FLUTICASONE PROPIONATE 50 MCG/ACT NA SUSP: 2.0000 | Freq: Every day | NASAL | Status: DC

## 2014-12-05 MED ORDER — PRAVASTATIN SODIUM 20 MG PO TABS: 20.0000 mg | ORAL_TABLET | Freq: Every day | ORAL | Status: DC

## 2014-12-05 MED ORDER — ALBUTEROL SULFATE HFA 108 (90 BASE) MCG/ACT IN AERS: 2.0000 | INHALATION_SPRAY | Freq: Four times a day (QID) | RESPIRATORY_TRACT | Status: DC | PRN

## 2014-12-05 NOTE — Assessment & Plan Note (Signed)
rx'd prava 07/2013 - has taken intermittently/rarely since then Reviewed importance of risk reduction with better chol control Check lipids annually The patient is also asked to make an attempt to improve diet and exercise patterns to aid in medical management of this problem.

## 2014-12-05 NOTE — Progress Notes (Signed)
Subjective:    Patient ID: Rick Harris, male    DOB: 05/14/1965, 49 y.o.   MRN: [id number]  HPI  patient is here today for annual physical. Patient feels well in general Also reviewed chronic medical conditions, interval events and current concerns  Past Medical History  Diagnosis Date  . Asthma   . Hypercholesteremia   . Frequent headaches   . OSA (obstructive sleep apnea)     mild-mod (11/2013), declined CPAP to work on weight reduction   Family History  Problem Relation Age of Onset  . Alcohol abuse Mother   . Hyperlipidemia Father     not biologic  . Stroke Father     not biologic  . Hypertension Father     not biologic  . Diabetes Father     not biologic  . Lung cancer Maternal Uncle   . Heart disease Maternal Grandmother   . Glaucoma Maternal Grandmother 63  . Lung disease Maternal Grandfather    Social History  Substance Use Topics  . Smoking status: Never Smoker   . Smokeless tobacco: None  . Alcohol Use: 3.0 oz/week    6 drink(s) per week    Review of Systems  Constitutional: Positive for fatigue. Negative for fever, activity change, appetite change and unexpected weight change (continued gain over past 12 mo).  Respiratory: Negative for cough, chest tightness, shortness of breath and wheezing.   Cardiovascular: Negative for chest pain, palpitations and leg swelling.  Neurological: Negative for dizziness, weakness and headaches.  Psychiatric/Behavioral: Negative for dysphoric mood. The patient is not nervous/anxious.   All other systems reviewed and are negative.      Objective:    Physical Exam  Constitutional: He is oriented to person, place, and time. He appears well-developed and well-nourished. No distress.  obese  HENT:  Head: Normocephalic and atraumatic.  Nose: Nose normal.  Mouth/Throat: Oropharynx is clear and moist.  Hearing grossly normal.  Eyes: Conjunctivae and EOM are normal. Pupils are equal, round, and reactive to light. No  scleral icterus.  Neck: Normal range of motion. Neck supple. No JVD present. No thyromegaly present.  Cardiovascular: Normal rate, regular rhythm, normal heart sounds and intact distal pulses.  Exam reveals no friction rub.   No murmur heard. No edema.  Pulmonary/Chest: Effort normal and breath sounds normal. No respiratory distress. He has no wheezes.  Abdominal: Soft. Bowel sounds are normal. He exhibits no distension and no mass. There is no tenderness. There is no guarding.  Genitourinary:  defer  Musculoskeletal: Normal range of motion. He exhibits no edema or tenderness.  Lymphadenopathy:    He has no cervical adenopathy.  Neurological: He is alert and oriented to person, place, and time. He has normal reflexes. No cranial nerve deficit.  Skin: Skin is warm and dry. No rash noted. No erythema.  Psychiatric: He has a normal mood and affect. His behavior is normal. Thought content normal.    BP 120/78 mmHg  Pulse 66  Temp(Src) 98.2 F (36.8 C) (Oral)  Ht 5\' 10"  (1.778 m)  Wt 258 lb 8 oz (117.255 kg)  BMI 37.09 kg/m2  SpO2 94% Wt Readings from Last 3 Encounters:  12/05/14 258 lb 8 oz (117.255 kg)  12/06/13 256 lb (116.121 kg)  11/24/13 246 lb (111.585 kg)    Lab Results  Component Value Date   WBC 8.4 12/06/2013   HGB 12.7* 12/06/2013   HCT 41.0 12/06/2013   PLT 234.0 12/06/2013   GLUCOSE 88 07/29/2013  CHOL 249* 12/06/2013   TRIG * 12/06/2013    447.0 Triglyceride is over 400; calculations on Lipids are invalid.   HDL 46.30 12/06/2013   LDLDIRECT 151.2 12/06/2013   LDLCALC 174* 07/29/2013   ALT 43 12/06/2013   AST 37 12/06/2013   NA 139 07/29/2013   K 4.7 07/29/2013   CL 104 07/29/2013   CREATININE 1.1 07/29/2013   BUN 15 07/29/2013   CO2 28 07/29/2013   TSH 1.84 07/29/2013   PSA 0.32 07/29/2013    No results found.     Assessment & Plan:   CPX/z00.00 - Patient has been counseled on age-appropriate routine health concerns for screening and prevention.  These are reviewed and up-to-date. Immunizations are up-to-date or declined. Labs ordered and reviewed.  Problem List Items Addressed This Visit    Hyperlipidemia    rx'd prava 07/2013 - has taken intermittently/rarely since then Reviewed importance of risk reduction with better chol control Check lipids annually The patient is also asked to make an attempt to improve diet and exercise patterns to aid in medical management of this problem.      Relevant Medications   pravastatin (PRAVACHOL) 20 MG tablet   Obese    Wt Readings from Last 3 Encounters:  12/05/14 258 lb 8 oz (117.255 kg)  12/06/13 256 lb (116.121 kg)  11/24/13 246 lb (111.585 kg)   Body mass index is 37.09 kg/(m^2). The patient is asked to make an attempt to improve diet and exercise patterns to aid in medical management of this problem.       OSA (obstructive sleep apnea)    Evaluation by pulmonary for mild to moderate OSA on sleep study summer 2015 reviewed Patient elected to work on weight reduction rather than initiation of CPap for his symptoms as he did not feel apnea symptoms contributing to negative quality of life Weight trends reviewed and encouraged to resume efforts to reduce BMI Denies fatigue or snoring symptoms at this time, will follow-up with sleep medicine as needed       Other Visit Diagnoses    Routine general medical examination at a health care facility    -  Primary    Relevant Orders    Basic metabolic panel    CBC with Differential/Platelet    Hepatic function panel    Lipid panel    TSH    Urinalysis, Routine w reflex microscopic (not at [facility])    HIV antibody        Gwendolyn Grant, MD

## 2014-12-05 NOTE — Patient Instructions (Signed)
It was good to see you today.  We have reviewed your prior records including labs and tests today  Consider obtaining your flu shot every fall as discussed to reduce risk of pneumonia or other asthma complications. Other Health Maintenance reviewed and all other recommended immunizations and age-appropriate screenings are up-to-date.  Test(s) ordered today. Your results will be released to [place] (or called to you) after review, usually within 72hours after test completion. If any changes need to be made, you will be notified at that same time.  Medications reviewed and updated, no changes recommended at this time.  Work on lifestyle changes as discussed (low fat, low carb, increased protein diet; improved exercise efforts; weight loss) to control sugar, blood pressure and cholesterol levels and/or reduce risk of developing other medical problems. Look into [email or web address].[email or web address] or other type of food journal to assist you in this process.  Please schedule followup in 12 months for annual exam and labs, call sooner if problems.

## 2014-12-05 NOTE — Assessment & Plan Note (Signed)
Evaluation by pulmonary for mild to moderate OSA on sleep study summer 2015 reviewed Patient elected to work on weight reduction rather than initiation of CPap for his symptoms as he did not feel apnea symptoms contributing to negative quality of life Weight trends reviewed and encouraged to resume efforts to reduce BMI Denies fatigue or snoring symptoms at this time, will follow-up with sleep medicine as needed

## 2014-12-05 NOTE — Assessment & Plan Note (Signed)
Wt Readings from Last 3 Encounters:  12/05/14 258 lb 8 oz (117.255 kg)  12/06/13 256 lb (116.121 kg)  11/24/13 246 lb (111.585 kg)   Body mass index is 37.09 kg/(m^2). The patient is asked to make an attempt to improve diet and exercise patterns to aid in medical management of this problem.

## 2014-12-05 NOTE — Progress Notes (Signed)
Pre visit review using our clinic review tool, if applicable. No additional management support is needed unless otherwise documented below in the visit note. 

## 2015-03-31 ENCOUNTER — Ambulatory Visit (INDEPENDENT_AMBULATORY_CARE_PROVIDER_SITE_OTHER): Payer: Commercial Managed Care - HMO

## 2015-03-31 ENCOUNTER — Encounter: Payer: Self-pay | Admitting: Family Medicine

## 2015-03-31 ENCOUNTER — Ambulatory Visit (INDEPENDENT_AMBULATORY_CARE_PROVIDER_SITE_OTHER): Payer: Commercial Managed Care - HMO | Admitting: Family Medicine

## 2015-03-31 VITALS — BP 130/80 | HR 93 | Temp 98.6°F | Resp 16 | Wt 258.0 lb

## 2015-03-31 DIAGNOSIS — M25571 Pain in right ankle and joints of right foot: Secondary | ICD-10-CM | POA: Diagnosis not present

## 2015-03-31 DIAGNOSIS — S93401A Sprain of unspecified ligament of right ankle, initial encounter: Secondary | ICD-10-CM | POA: Diagnosis not present

## 2015-03-31 DIAGNOSIS — M25471 Effusion, right ankle: Secondary | ICD-10-CM | POA: Diagnosis not present

## 2015-03-31 MED ORDER — HYDROCODONE-ACETAMINOPHEN 5-325 MG PO TABS: 1.0000 | ORAL_TABLET | ORAL | Status: DC | PRN

## 2015-03-31 NOTE — Patient Instructions (Addendum)
Because you received an x-ray today, you will receive an invoice from [facility]. Please contact [facility] at [phone] with questions or concerns regarding your invoice. Our billing staff will not be able to assist you with those questions.   Stay off work for 1 week  Nonweightbearing, using crutches and wearing your Cam Walker  Ice 5 or 6 times a day for about 15 minutes  Elevate  Return in one week for a recheck  Ibuprofen 800 mg 3 times daily or Aleve (naproxen) 440 mg twice daily for pain  Norco (hydrocodone) 5/325 one every 4-6 hours for severe pain only.

## 2015-03-31 NOTE — Progress Notes (Signed)
Patient ID: Rick Harris, male    DOB: 22-Jan-1966  Age: 50 y.o. MRN: [id number]  Chief Complaint  Patient presents with  . Ankle Injury    Right ankle    Subjective:   Patient fell on a trampoline a couple of hours ago.  Current allergies, medications, problem list, past/family and social histories reviewed.  Objective:  BP 130/80 mmHg  Pulse 93  Temp(Src) 98.6 F (37 C) (Oral)  Resp 16  Wt 258 lb (117.028 kg)  SpO2 98%  Very swollen both sides of the right ankle. Special swollen on the lateral aspect. Very tender both malleoli. Neurovascular intact.  Assessment & Plan:   Assessment: 1. Right ankle pain   2. Right ankle swelling   3. Right ankle sprain, initial encounter       Plan: No fracture seen on x-ray.  Orders Placed This Encounter  Procedures  . DG Ankle Complete Right    Order Specific Question:  Reason for Exam (SYMPTOM  OR DIAGNOSIS REQUIRED)    Answer:  fell on trampoline    Order Specific Question:  Preferred imaging location?    Answer:  External    Meds ordered this encounter  Medications  . HYDROcodone-acetaminophen (NORCO) 5-325 MG tablet    Sig: Take 1 tablet by mouth every 4 (four) hours as needed.    Dispense:  20 tablet    Refill:  0         Patient Instructions  Because you received an x-ray today, you will receive an invoice from [facility]. Please contact [facility] at [phone] with questions or concerns regarding your invoice. Our billing staff will not be able to assist you with those questions.   Stay off work for 1 week  Nonweightbearing, using crutches and wearing your Cam Walker  Ice 5 or 6 times a day for about 15 minutes  Elevate  Return in one week for a recheck  Ibuprofen 800 mg 3 times daily or Aleve (naproxen) 440 mg twice daily for pain  Norco (hydrocodone) 5/325 one every 4-6 hours for severe pain only.     No Follow-up on file.   Theo Reither, MD 03/31/2015

## 2015-04-06 ENCOUNTER — Ambulatory Visit (HOSPITAL_COMMUNITY)
Admission: RE | Admit: 2015-04-06 | Discharge: 2015-04-06 | Disposition: A | Discharge status: Home / Self Care | Payer: Commercial Managed Care - HMO | Source: Ambulatory Visit | Attending: Family Medicine | Admitting: Family Medicine

## 2015-04-06 ENCOUNTER — Ambulatory Visit (INDEPENDENT_AMBULATORY_CARE_PROVIDER_SITE_OTHER): Payer: Commercial Managed Care - HMO | Admitting: Family Medicine

## 2015-04-06 VITALS — BP 158/82 | HR 65 | Temp 97.5°F | Resp 18 | Ht 69.75 in | Wt 258.0 lb

## 2015-04-06 DIAGNOSIS — M25474 Effusion, right foot: Secondary | ICD-10-CM | POA: Insufficient documentation

## 2015-04-06 DIAGNOSIS — S93401D Sprain of unspecified ligament of right ankle, subsequent encounter: Secondary | ICD-10-CM

## 2015-04-06 NOTE — Progress Notes (Signed)
@[email or web address] @ By signing my name below, I, Raven Small, attest that this documentation has been prepared under the direction and in the presence of Robyn Haber, MD.  Electronically Signed: Thea Alken, ED Scribe. 04/06/2015. 10:25 AM.  Patient ID: Rick Harris MRN: [id number], DOB: 1965/10/11, 50 y.o. Date of Encounter: 04/06/2015, 10:17 AM  Primary Physician: Gwendolyn Grant, MD  Chief Complaint:  Chief Complaint  Patient presents with  . Follow-up    Right ankle  . Joint Swelling    Right ankle    HPI: 50 y.o. year old male with history below presents with right ankle injury that occurred 6 days ago. Pt states he injured right ankle at a indoor trampoline place 6 days ago. He was seen by Dr. Linna Darner 6 days ago and had negative XR. He was prescribed ibuprofen 800mg  and hydrocodone. He was placed in cam walking and has been non weight bearing and ambulating with crutches. He has tenderness to ankle and foot and began to notice increased swelling to entire foot 4 days ago.  He just started applying pressure to right ankle yesterday. He has been keeping ankle elevated and icing ankle and foot 4 times a day.   Past Medical History  Diagnosis Date  . Asthma   . Hypercholesteremia   . Frequent headaches   . OSA (obstructive sleep apnea)     mild-mod (11/2013), declined CPAP to work on weight reduction     Home Meds: Prior to Admission medications   Medication Sig Start Date End Date Taking? Authorizing Provider  albuterol (PROVENTIL HFA;VENTOLIN HFA) 108 (90 BASE) MCG/ACT inhaler Inhale 2 puffs into the lungs every 6 (six) hours as needed for wheezing (wheezing). 12/05/14  Yes Rowe Clack, MD  fluticasone (FLONASE) 50 MCG/ACT nasal spray Place 2 sprays into both nostrils daily. 12/05/14  Yes Rowe Clack, MD  HYDROcodone-acetaminophen (NORCO) 5-325 MG tablet Take 1 tablet by mouth every 4 (four) hours as needed. 03/31/15  Yes Posey Boyer, MD  ibuprofen (ADVIL,MOTRIN) 200  MG tablet Take 400 mg by mouth every 6 (six) hours as needed for moderate pain (pain).   Yes Historical Provider, MD  pravastatin (PRAVACHOL) 20 MG tablet Take 1 tablet (20 mg total) by mouth daily. 12/05/14  Yes Rowe Clack, MD    Allergies: No Known Allergies  Social History   Social History  . Marital Status: Legally Separated    Spouse Name: N/A  . Number of Children: N/A  . Years of Education: N/A   Occupational History  . instructor    Social History Main Topics  . Smoking status: Never Smoker   . Smokeless tobacco: Not on file  . Alcohol Use: 3.0 oz/week    6 drink(s) per week  . Drug Use: No  . Sexual Activity: Not on file   Other Topics Concern  . Not on file   Social History Narrative    Review of Systems: Constitutional: negative for chills, fever, night sweats, weight changes, or fatigue  HEENT: negative for vision changes, hearing loss, congestion, rhinorrhea, ST, epistaxis, or sinus pressure Cardiovascular: negative for chest pain or palpitations Respiratory: negative for hemoptysis, wheezing, shortness of breath, or cough Abdominal: negative for abdominal pain, nausea, vomiting, diarrhea, or constipation Dermatological: negative for rash Neurologic: negative for headache, dizziness, or syncope All other systems reviewed and are otherwise negative with the exception to those above and in the HPI.   Physical Exam: Blood pressure 158/82, pulse 65, temperature 97.5 F (36.4 C),  temperature source Oral, resp. rate 18, height 5' 9.75" (1.772 m), weight 258 lb (117.028 kg), SpO2 98 %., Body mass index is 37.27 kg/(m^2). General: Well developed, well nourished, in no acute distress. Head: Normocephalic, atraumatic, eyes without discharge, sclera non-icteric, nares are without discharge. Bilateral auditory canals clear, TM's are without perforation, pearly grey and translucent with reflective cone of light bilaterally. Oral cavity moist, posterior pharynx  without exudate, erythema, peritonsillar abscess, or post nasal drip.  Neck: Supple. No thyromegaly. Full ROM. No lymphadenopathy. Lungs: Clear bilaterally to auscultation without wheezes, rales, or rhonchi. Breathing is unlabored. Heart: RRR with S1 S2. No murmurs, rubs, or gallops appreciated. Abdomen: Soft, non-tender, non-distended with normoactive bowel sounds. No hepatomegaly. No rebound/guarding. No obvious abdominal masses. Msk:  Strength and tone normal for age. Extremities/Skin: Warm and dry. No clubbing or cyanosis. No edema. No rashes or suspicious lesions. Neuro: Alert and oriented X 3. Moves all extremities spontaneously. Gait is normal. CNII-XII grossly in tact. Psych:  Responds to questions appropriately with a normal affect.    ASSESSMENT AND PLAN:  50 y.o. year old male with     ICD-9-CM ICD-10-CM   1. Sprain of ankle, right, subsequent encounter V58.89 S93.401D Ultrasound doppler venous arms bilateral   845.00  Ambulatory referral to Orthopedic Surgery  2. Swelling of foot joint, right 719.07 M25.474 Ultrasound doppler venous arms bilateral     Ambulatory referral to Orthopedic Surgery    Signed, Robyn Haber, MD 04/06/2015 10:17 AM

## 2015-04-06 NOTE — Patient Instructions (Addendum)
Your ultrasound is scheduled for 1pm today at [place] will go through the [facility]; which is the main tower.  You are scheduled with [facility] in [facility] [place].  [address] [place], [place] [address]

## 2015-04-06 NOTE — Progress Notes (Signed)
VASCULAR LAB PRELIMINARY  PRELIMINARY  PRELIMINARY  PRELIMINARY  Right lower extremity venous duplex completed.    Preliminary report:  Right:  No evidence of DVT, superficial thrombosis, or Baker's cyst.   Janifer Adie, RVT, RDMS 04/06/2015, 2:11 PM

## 2015-04-13 ENCOUNTER — Telehealth: Payer: Self-pay | Admitting: Radiology

## 2015-04-13 NOTE — Telephone Encounter (Signed)
At the patient's request, I made a copy of his x-ray on CD, and gave it to his wife.

## 2016-01-07 ENCOUNTER — Ambulatory Visit (INDEPENDENT_AMBULATORY_CARE_PROVIDER_SITE_OTHER): Payer: Commercial Managed Care - HMO | Admitting: Family

## 2016-01-07 ENCOUNTER — Encounter: Payer: Self-pay | Admitting: Family

## 2016-01-07 ENCOUNTER — Other Ambulatory Visit (INDEPENDENT_AMBULATORY_CARE_PROVIDER_SITE_OTHER): Payer: Commercial Managed Care - HMO

## 2016-01-07 VITALS — BP 130/90 | HR 59 | Temp 98.5°F | Resp 16 | Ht 69.75 in | Wt 250.0 lb

## 2016-01-07 DIAGNOSIS — M659 Synovitis and tenosynovitis, unspecified: Secondary | ICD-10-CM | POA: Diagnosis not present

## 2016-01-07 DIAGNOSIS — Z0001 Encounter for general adult medical examination with abnormal findings: Secondary | ICD-10-CM

## 2016-01-07 DIAGNOSIS — Z0000 Encounter for general adult medical examination without abnormal findings: Secondary | ICD-10-CM | POA: Insufficient documentation

## 2016-01-07 LAB — COMPREHENSIVE METABOLIC PANEL
ALBUMIN: 4.2 g/dL (ref 3.5–5.2)
ALT: 33 U/L (ref 0–53)
AST: 25 U/L (ref 0–37)
Alkaline Phosphatase: 73 U/L (ref 39–117)
BUN: 16 mg/dL (ref 6–23)
CALCIUM: 9.5 mg/dL (ref 8.4–10.5)
CHLORIDE: 102 meq/L (ref 96–112)
CO2: 28 meq/L (ref 19–32)
CREATININE: 1.19 mg/dL (ref 0.40–1.50)
GFR: 83.81 mL/min (ref >60.00)
Glucose, Bld: 95 mg/dL (ref 70–99)
POTASSIUM: 4.5 meq/L (ref 3.5–5.1)
SODIUM: 136 meq/L (ref 135–145)
Total Bilirubin: 0.3 mg/dL (ref 0.2–1.2)
Total Protein: 7.9 g/dL (ref 6.0–8.3)

## 2016-01-07 LAB — CBC
HEMATOCRIT: 44.3 % (ref 39.0–52.0)
Hemoglobin: 14.1 g/dL (ref 13.0–17.0)
MCHC: 31.8 g/dL (ref 30.0–36.0)
MCV: 73.3 fl — AB (ref 78.0–100.0)
Platelets: 260 10*3/uL (ref 150.0–400.0)
RBC: 6.05 Mil/uL — AB (ref 4.22–5.81)
RDW: 14.9 % (ref 11.5–15.5)
WBC: 7.5 10*3/uL (ref 4.0–10.5)

## 2016-01-07 LAB — LIPID PANEL
CHOL/HDL RATIO: 6
CHOLESTEROL: 289 mg/dL — AB (ref 0–200)
HDL: 49.4 mg/dL (ref >39.00)

## 2016-01-07 LAB — HEMOGLOBIN A1C: HEMOGLOBIN A1C: 6 % (ref 4.6–6.5)

## 2016-01-07 LAB — LDL CHOLESTEROL, DIRECT: LDL DIRECT: 175 mg/dL

## 2016-01-07 MED ORDER — IBUPROFEN-FAMOTIDINE 800-26.6 MG PO TABS: 1.0000 | ORAL_TABLET | Freq: Three times a day (TID) | ORAL | 1 refills | Status: DC | PRN

## 2016-01-07 NOTE — Progress Notes (Signed)
Subjective:    Patient ID: Rick Harris, male    DOB: 1965/03/12, 50 y.o.   MRN: [id number]  Chief Complaint  Patient presents with  . Establish Care    CPE, fasting, fractured right ankle in feb still having some swelling wanted you to take a look at it if you could    HPI:  Rick Harris is a 50 y.o. male who presents today for an annual wellness visit.   1) Health Maintenance -   Diet - Averages about 1-3 meals per day consisting of a regular diet; Caffeine intake is occasional.   Exercise - Swimming on occasion.    2) Preventative Exams / Immunizations:  Dental -- Due for exam  Vision -- Due for exam   Health Maintenance  Topic Date Due  . INFLUENZA VACCINE  05/10/2016 (Originally 09/11/2015)  . TETANUS/TDAP  09/03/2024  . HIV Screening  Completed    Immunization History  Administered Date(s) Administered  . Td 02/11/2008  . Tdap 09/04/2014    No Known Allergies   Outpatient Medications Prior to Visit  Medication Sig Dispense Refill  . albuterol (PROVENTIL HFA;VENTOLIN HFA) 108 (90 BASE) MCG/ACT inhaler Inhale 2 puffs into the lungs every 6 (six) hours as needed for wheezing (wheezing). 18 g 5  . fluticasone (FLONASE) 50 MCG/ACT nasal spray Place 2 sprays into both nostrils daily. 16 g 5  . HYDROcodone-acetaminophen (NORCO) 5-325 MG tablet Take 1 tablet by mouth every 4 (four) hours as needed. 20 tablet 0  . ibuprofen (ADVIL,MOTRIN) 200 MG tablet Take 400 mg by mouth every 6 (six) hours as needed for moderate pain (pain).    . pravastatin (PRAVACHOL) 20 MG tablet Take 1 tablet (20 mg total) by mouth daily. 90 tablet 3   No facility-administered medications prior to visit.      Past Medical History:  Diagnosis Date  . Asthma   . Frequent headaches   . Hypercholesteremia   . OSA (obstructive sleep apnea)    mild-mod (11/2013), declined CPAP to work on weight reduction     Past Surgical History:  Procedure Laterality Date  . APPENDECTOMY  2009    . NASAL SINUS SURGERY  04/1997     Family History  Problem Relation Age of Onset  . Alcohol abuse Mother   . Hyperlipidemia Father     not biologic  . Stroke Father     not biologic  . Hypertension Father     not biologic  . Diabetes Father     not biologic  . Lung cancer Maternal Uncle   . Heart disease Maternal Grandmother   . Glaucoma Maternal Grandmother 63  . Lung disease Maternal Grandfather      Social History   Social History  . Marital status: Legally Separated    Spouse name: N/A  . Number of children: 4  . Years of education: 14   Occupational History  . Instructor    Social History Main Topics  . Smoking status: Never Smoker  . Smokeless tobacco: Never Used  . Alcohol use 10.8 oz/week    6 Standard drinks or equivalent, 12 Cans of beer per week  . Drug use: No  . Sexual activity: Not on file   Other Topics Concern  . Not on file   Social History Narrative   Fun: Swim, drink beer       Review of Systems  Constitutional: Denies fever, chills, fatigue, or significant weight gain/loss. HENT: Head: Denies headache  or neck pain Ears: Denies changes in hearing, ringing in ears, earache, drainage Nose: Denies discharge, stuffiness, itching, nosebleed, sinus pain Throat: Denies sore throat, hoarseness, dry mouth, sores, thrush Eyes: Denies loss/changes in vision, pain, redness, blurry/double vision, flashing lights Cardiovascular: Denies chest pain/discomfort, tightness, palpitations, shortness of breath with activity, difficulty lying down, swelling, sudden awakening with shortness of breath Respiratory: Denies shortness of breath, cough, sputum production, wheezing Gastrointestinal: Denies dysphasia, heartburn, change in appetite, nausea, change in bowel habits, rectal bleeding, constipation, diarrhea, yellow skin or eyes Genitourinary: Denies frequency, urgency, burning/pain, blood in urine, incontinence, change in urinary  strength. Musculoskeletal: Denies muscle/joint pain, stiffness, back pain, redness or swelling of joints, trauma Skin: Denies rashes, lumps, itching, dryness, color changes, or hair/nail changes Neurological: Denies dizziness, fainting, seizures, weakness, numbness, tingling, tremor Psychiatric - Denies nervousness, stress, depression or memory loss Endocrine: Denies heat or cold intolerance, sweating, frequent urination, excessive thirst, changes in appetite Hematologic: Denies ease of bruising or bleeding     Objective:     BP 130/90 (BP Location: Left Arm, Patient Position: Sitting, Cuff Size: Large)   Pulse (!) 59   Temp 98.5 F (36.9 C) (Oral)   Resp 16   Ht 5' 9.75" (1.772 m)   Wt 250 lb (113.4 kg)   SpO2 98%   BMI 36.13 kg/m  Nursing note and vital signs reviewed.  Physical Exam  Constitutional: He is oriented to person, place, and time. He appears well-developed and well-nourished.  HENT:  Head: Normocephalic.  Right Ear: Hearing, tympanic membrane, external ear and ear canal normal.  Left Ear: Hearing, tympanic membrane, external ear and ear canal normal.  Nose: Nose normal.  Mouth/Throat: Uvula is midline, oropharynx is clear and moist and mucous membranes are normal.  Eyes: Conjunctivae and EOM are normal. Pupils are equal, round, and reactive to light.  Neck: Neck supple. No JVD present. No tracheal deviation present. No thyromegaly present.  Cardiovascular: Normal rate, regular rhythm, normal heart sounds and intact distal pulses.   Pulmonary/Chest: Effort normal and breath sounds normal.  Abdominal: Soft. Bowel sounds are normal. He exhibits no distension and no mass. There is no tenderness. There is no rebound and no guarding.  Musculoskeletal: Normal range of motion.  Right ankle - mild/moderate edema with tenderness generalized over the medial and lateral ankle. Range of motion within normal limits. Pulses are intact and appropriate. Ligamentous testing deferred  secondary to discomfort.  Lymphadenopathy:    He has no cervical adenopathy.  Neurological: He is alert and oriented to person, place, and time. He has normal reflexes. No cranial nerve deficit. He exhibits normal muscle tone. Coordination normal.  Skin: Skin is warm and dry.  Psychiatric: He has a normal mood and affect. His behavior is normal. Judgment and thought content normal.       Assessment & Plan:   Problem List Items Addressed This Visit      Other   Encounter for general adult medical examination with abnormal findings - Primary    1) Anticipatory Guidance: Discussed importance of wearing a seatbelt while driving and not texting while driving; changing batteries in smoke detector at least once annually; wearing suntan lotion when outside; eating a balanced and moderate diet; getting physical activity at least 30 minutes per day.  2) Immunizations / Screenings / Labs:  Declines influenza. All other immunizations are up-to-date per recommendations. Due for a dental and vision exam encouraged to be completed independently. All other screenings are up-to-date per recommendations. Obtain  CBC, CMET, and lipid profile.   Overall well exam with risk factors for cardiovascular disease including obesity and hyperlipidemia. Recommend weight loss of 5-10 % of current body weight through nutrition and physical activity. 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 Has significant ankle problems currently managed by orthopedics. Had discussion regarding treatment options with the recommendation of orthopedics for surgery. Continue current healthy lifestyle behaviors and choices. Follow-up prevention exam in 1 year. Follow-up office visit for chronic conditions pending blood work.       Relevant Orders   CBC (Completed)   Comprehensive metabolic panel (Completed)   Lipid panel (Completed)   Hemoglobin A1c (Completed)    Other Visit Diagnoses    Synovitis of right ankle        Relevant Medications   Ibuprofen-Famotidine 800-26.6 MG TABS       I have discontinued Mr. Bibler ibuprofen, pravastatin, and HYDROcodone-acetaminophen. I am also having him start on Ibuprofen-Famotidine. Additionally, I am having him maintain his albuterol and fluticasone.   Meds ordered this encounter  Medications  . fluticasone (FLONASE) 50 MCG/ACT nasal spray    Sig: Place 2 sprays into both nostrils daily.  . Ibuprofen-Famotidine 800-26.6 MG TABS    Sig: Take 1 tablet by mouth 3 (three) times daily as needed.    Dispense:  90 tablet    Refill:  1    Order Specific Question:   Supervising Provider    Answer:   Pricilla Holm A [id number]     Follow-up: Return if symptoms worsen or fail to improve.   Mauricio Po, FNP

## 2016-01-07 NOTE — Assessment & Plan Note (Signed)
1) Anticipatory Guidance: Discussed importance of wearing a seatbelt while driving and not texting while driving; changing batteries in smoke detector at least once annually; wearing suntan lotion when outside; eating a balanced and moderate diet; getting physical activity at least 30 minutes per day.  2) Immunizations / Screenings / Labs:  Declines influenza. All other immunizations are up-to-date per recommendations. Due for a dental and vision exam encouraged to be completed independently. All other screenings are up-to-date per recommendations. Obtain CBC, CMET, and lipid profile.   Overall well exam with risk factors for cardiovascular disease including obesity and hyperlipidemia. Recommend weight loss of 5-10 % of current body weight through nutrition and physical activity. 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 Has significant ankle problems currently managed by orthopedics. Had discussion regarding treatment options with the recommendation of orthopedics for surgery. Continue current healthy lifestyle behaviors and choices. Follow-up prevention exam in 1 year. Follow-up office visit for chronic conditions pending blood work.

## 2016-01-07 NOTE — Patient Instructions (Addendum)
Thank you for choosing [facility].  SUMMARY AND INSTRUCTIONS:  Please follow up with Dr. Casimer Leek Dr. Linton Rump regarding the potential for ankle surgery.  Continue with ice as needed for discomfort.   Medication:  Duexis - 3x per day as needed for inflammation and pain control.  Your prescription(s) have been submitted to your pharmacy or been printed and provided for you. Please take as directed and contact our office if you believe you are having problem(s) with the medication(s) or have any questions.  Labs:  Please stop by the lab on the lower level of the building for your blood work. Your results will be released to [place] (or called to you) after review, usually within 72 hours after test completion. If any changes need to be made, you will be notified at that same time.  1.) The lab is open from 7:30am to 5:30 pm Monday-Friday 2.) No appointment is necessary 3.) Fasting (if needed) is 6-8 hours after food and drink; black coffee and water are okay    Follow up:  If your symptoms worsen or fail to improve, please contact our office for further instruction, or in case of emergency go directly to the emergency room at the closest medical facility.   Health Maintenance, Male A healthy lifestyle and preventative care can promote health and wellness.  Maintain regular health, dental, and eye exams.  Eat a healthy diet. Foods like vegetables, fruits, whole grains, low-fat dairy products, and lean protein foods contain the nutrients you need and are low in calories. Decrease your intake of foods high in solid fats, added sugars, and salt. Get information about a proper diet from your health care provider, if necessary.  Regular physical exercise is one of the most important things you can do for your health. Most adults should get at least 150 minutes of moderate-intensity exercise (any activity that increases your heart rate and causes you to sweat) each week. In addition,  most adults need muscle-strengthening exercises on 2 or more days a week.   Maintain a healthy weight. The body mass index (BMI) is a screening tool to identify possible weight problems. It provides an estimate of body fat based on height and weight. Your health care provider can find your BMI and can help you achieve or maintain a healthy weight. For males 20 years and older:  A BMI below 18.5 is considered underweight.  A BMI of 18.5 to 24.9 is normal.  A BMI of 25 to 29.9 is considered overweight.  A BMI of 30 and above is considered obese.  Maintain normal blood lipids and cholesterol by exercising and minimizing your intake of saturated fat. Eat a balanced diet with plenty of fruits and vegetables. Blood tests for lipids and cholesterol should begin at age 25 and be repeated every 5 years. If your lipid or cholesterol levels are high, you are over age 70, or you are at high risk for heart disease, you may need your cholesterol levels checked more frequently.Ongoing high lipid and cholesterol levels should be treated with medicines if diet and exercise are not working.  If you smoke, find out from your health care provider how to quit. If you do not use tobacco, do not start.  Lung cancer screening is recommended for adults aged 73-80 years who are at high risk for developing lung cancer because of a history of smoking. A yearly low-dose CT scan of the lungs is recommended for people who have at least a 30-pack-year history of  smoking and are current smokers or have quit within the past 15 years. A pack year of smoking is smoking an average of 1 pack of cigarettes a day for 1 year (for example, a 30-pack-year history of smoking could mean smoking 1 pack a day for 30 years or 2 packs a day for 15 years). Yearly screening should continue until the smoker has stopped smoking for at least 15 years. Yearly screening should be stopped for people who develop a health problem that would prevent them  from having lung cancer treatment.  If you choose to drink alcohol, do not have more than 2 drinks per day. One drink is considered to be 12 oz (360 mL) of beer, 5 oz (150 mL) of wine, or 1.5 oz (45 mL) of liquor.  Avoid the use of street drugs. Do not share needles with anyone. Ask for help if you need support or instructions about stopping the use of drugs.  High blood pressure causes heart disease and increases the risk of stroke. High blood pressure is more likely to develop in:  People who have blood pressure in the end of the normal range (100-139/85-89 mm Hg).  People who are overweight or obese.  People who are African American.  If you are 52-36 years of age, have your blood pressure checked every 3-5 years. If you are 37 years of age or older, have your blood pressure checked every year. You should have your blood pressure measured twice-once when you are at a hospital or clinic, and once when you are not at a hospital or clinic. Record the average of the two measurements. To check your blood pressure when you are not at a hospital or clinic, you can use:  An automated blood pressure machine at a pharmacy.  A home blood pressure monitor.  If you are 35-48 years old, ask your health care provider if you should take aspirin to prevent heart disease.  Diabetes screening involves taking a blood sample to check your fasting blood sugar level. This should be done once every 3 years after age 84 if you are at a normal weight and without risk factors for diabetes. Testing should be considered at a younger age or be carried out more frequently if you are overweight and have at least 1 risk factor for diabetes.  Colorectal cancer can be detected and often prevented. Most routine colorectal cancer screening begins at the age of 52 and continues through age 48. However, your health care provider may recommend screening at an earlier age if you have risk factors for colon cancer. On a yearly  basis, your health care provider may provide home test kits to check for hidden blood in the stool. A small camera at the end of a tube may be used to directly examine the colon (sigmoidoscopy or colonoscopy) to detect the earliest forms of colorectal cancer. Talk to your health care provider about this at age 80 when routine screening begins. A direct exam of the colon should be repeated every 5-10 years through age 40, unless early forms of precancerous polyps or small growths are found.  People who are at an increased risk for hepatitis B should be screened for this virus. You are considered at high risk for hepatitis B if:  You were born in a country where hepatitis B occurs often. Talk with your health care provider about which countries are considered high risk.  Your parents were born in a high-risk country and you have not  received a shot to protect against hepatitis B (hepatitis B vaccine).  You have HIV or AIDS.  You use needles to inject street drugs.  You live with, or have sex with, someone who has hepatitis B.  You are a man who has sex with other men (MSM).  You get hemodialysis treatment.  You take certain medicines for conditions like cancer, organ transplantation, and autoimmune conditions.  Hepatitis C blood testing is recommended for all people born from [birth year] through 1963 and any individual with known risk factors for hepatitis C.  Healthy men should no longer receive prostate-specific antigen (PSA) blood tests as part of routine cancer screening. Talk to your health care provider about prostate cancer screening.  Testicular cancer screening is not recommended for adolescents or adult males who have no symptoms. Screening includes self-exam, a health care provider exam, and other screening tests. Consult with your health care provider about any symptoms you have or any concerns you have about testicular cancer.  Practice safe sex. Use condoms and avoid high-risk sexual  practices to reduce the spread of sexually transmitted infections (STIs).  You should be screened for STIs, including gonorrhea and chlamydia if:  You are sexually active and are younger than 24 years.  You are older than 24 years, and your health care provider tells you that you are at risk for this type of infection.  Your sexual activity has changed since you were last screened, and you are at an increased risk for chlamydia or gonorrhea. Ask your health care provider if you are at risk.  If you are at risk of being infected with HIV, it is recommended that you take a prescription medicine daily to prevent HIV infection. This is called pre-exposure prophylaxis (PrEP). You are considered at risk if:  You are a man who has sex with other men (MSM).  You are a heterosexual man who is sexually active with multiple partners.  You take drugs by injection.  You are sexually active with a partner who has HIV.  Talk with your health care provider about whether you are at high risk of being infected with HIV. If you choose to begin PrEP, you should first be tested for HIV. You should then be tested every 3 months for as long as you are taking PrEP.  Use sunscreen. Apply sunscreen liberally and repeatedly throughout the day. You should seek shade when your shadow is shorter than you. Protect yourself by wearing long sleeves, pants, a wide-brimmed hat, and sunglasses year round whenever you are outdoors.  Tell your health care provider of new moles or changes in moles, especially if there is a change in shape or color. Also, tell your health care provider if a mole is larger than the size of a pencil eraser.  A one-time screening for abdominal aortic aneurysm (AAA) and surgical repair of large AAAs by ultrasound is recommended for men aged 54-75 years who are current or former smokers.  Stay current with your vaccines (immunizations). This information is not intended to replace advice given to  you by your health care provider. Make sure you discuss any questions you have with your health care provider. Document Released: 07/26/2007 Document Revised: 02/17/2014 Document Reviewed: 10/31/2014 Elsevier Interactive Patient Education  2017 [employer].

## 2016-01-10 ENCOUNTER — Other Ambulatory Visit: Payer: Self-pay | Admitting: Family

## 2016-01-10 MED ORDER — FENOFIBRATE 145 MG PO TABS: 145.0000 mg | ORAL_TABLET | Freq: Every day | ORAL | 2 refills | Status: DC

## 2016-02-11 HISTORY — PX: WISDOM TOOTH EXTRACTION: SHX21

## 2016-11-19 ENCOUNTER — Ambulatory Visit
Admission: RE | Admit: 2016-11-19 | Discharge: 2016-11-19 | Disposition: A | Discharge status: Home / Self Care | Payer: Worker's Compensation | Source: Ambulatory Visit | Attending: Nurse Practitioner | Admitting: Nurse Practitioner

## 2016-11-19 ENCOUNTER — Other Ambulatory Visit: Payer: Self-pay | Admitting: Nurse Practitioner

## 2016-11-19 DIAGNOSIS — M79671 Pain in right foot: Secondary | ICD-10-CM

## 2016-11-19 DIAGNOSIS — M25571 Pain in right ankle and joints of right foot: Secondary | ICD-10-CM

## 2016-11-24 ENCOUNTER — Encounter: Payer: Self-pay | Admitting: Podiatry

## 2016-11-24 ENCOUNTER — Ambulatory Visit (INDEPENDENT_AMBULATORY_CARE_PROVIDER_SITE_OTHER): Payer: 59 | Admitting: Podiatry

## 2016-11-24 ENCOUNTER — Other Ambulatory Visit: Payer: Self-pay | Admitting: Podiatry

## 2016-11-24 ENCOUNTER — Ambulatory Visit (INDEPENDENT_AMBULATORY_CARE_PROVIDER_SITE_OTHER): Payer: 59

## 2016-11-24 VITALS — BP 161/111 | HR 80 | Ht 72.0 in | Wt 280.0 lb

## 2016-11-24 DIAGNOSIS — S99921A Unspecified injury of right foot, initial encounter: Secondary | ICD-10-CM

## 2016-11-24 DIAGNOSIS — M79671 Pain in right foot: Secondary | ICD-10-CM

## 2016-11-24 MED ORDER — HYDROCODONE-ACETAMINOPHEN 10-325 MG PO TABS: 1.0000 | ORAL_TABLET | Freq: Three times a day (TID) | ORAL | 0 refills | Status: DC | PRN

## 2016-11-24 MED ORDER — MELOXICAM 15 MG PO TABS: 15.0000 mg | ORAL_TABLET | Freq: Every day | ORAL | 1 refills | Status: DC

## 2016-11-24 NOTE — Progress Notes (Signed)
   Subjective:    Patient ID: Rick Harris, male    DOB: [birth year], 51 y.o.   MRN: [id number]  HPI    Review of Systems  Musculoskeletal: Positive for arthralgias and joint swelling.  All other systems reviewed and are negative.      Objective:   Physical Exam        Assessment & Plan:

## 2016-11-26 NOTE — Progress Notes (Signed)
   HPI: 51 year old male presenting today as a new patient with a complaint of an injury to the right ankle that occurred approximately one week ago. He states he dropped a heavy chest on the ankle. He reports associated pain and swelling. He reports a previous fracture to the same ankle one year ago but denies any previous surgeries to the area. Walking increases the pain. He denies alleviating factors. He has not done anything to treat the pain. He is here for further evaluation and treatment.   Past Medical History:  Diagnosis Date  . Asthma   . Frequent headaches   . Hypercholesteremia   . OSA (obstructive sleep apnea)    mild-mod (11/2013), declined CPAP to work on weight reduction     Physical Exam: General: The patient is alert and oriented x3 in no acute distress.  Dermatology: Skin is warm, dry and supple bilateral lower extremities. Negative for open lesions or macerations.  Vascular: Palpable pedal pulses bilaterally. No edema or erythema noted. Capillary refill within normal limits.  Neurological: Epicritic and protective threshold grossly intact bilaterally.   Musculoskeletal Exam: Moderate edema with pain on palpation to the right foot and ankle. Range of motion within normal limits to all pedal and ankle joints bilateral. Muscle strength 5/5 in all groups bilateral.   Radiographic Exam:  Normal osseous mineralization. Joint spaces preserved. No fracture/dislocation/boney destruction.    Assessment: - Soft tissue injury to the right foot     Plan of Care:  - Patient evaluated. X-rays reviewed. - Cam boot dispensed. Weightbearing as tolerated. - Prescription for meloxicam 15 mg given to patient. - Prescription for Vicodin 5/325 mg given to patient. - Return to clinic in 4 weeks.   Edrick Kins, DPM [facility]  Dr. Edrick Kins, DPM    [address], [place] [address]                Office [phone]  Fax [phone]

## 2016-11-27 ENCOUNTER — Telehealth: Payer: Self-pay | Admitting: Podiatry

## 2016-11-27 MED ORDER — MELOXICAM 15 MG PO TABS: 15.0000 mg | ORAL_TABLET | Freq: Every day | ORAL | 1 refills | Status: AC

## 2016-11-27 NOTE — Telephone Encounter (Signed)
I saw Dr. Amalia Hailey on Monday afternoon and he was supposed to call in a prescription to my pharmacy which is [facility] [address]. They still have not received the Rx. Can you please find out what is going on and let me know at [phone]. Thank you.

## 2016-11-27 NOTE — Telephone Encounter (Signed)
Called pt, LMOM informing patient that I sent [facility] to [facility] on [address]

## 2016-12-15 ENCOUNTER — Emergency Department (HOSPITAL_COMMUNITY)
Admission: EM | Admit: 2016-12-15 | Discharge: 2016-12-15 | Disposition: A | Discharge status: Home / Self Care | Payer: 59 | Attending: Emergency Medicine | Admitting: Emergency Medicine

## 2016-12-15 ENCOUNTER — Encounter (HOSPITAL_COMMUNITY): Payer: Self-pay | Admitting: *Deleted

## 2016-12-15 DIAGNOSIS — J069 Acute upper respiratory infection, unspecified: Secondary | ICD-10-CM | POA: Diagnosis not present

## 2016-12-15 DIAGNOSIS — J4521 Mild intermittent asthma with (acute) exacerbation: Secondary | ICD-10-CM | POA: Insufficient documentation

## 2016-12-15 DIAGNOSIS — Z79899 Other long term (current) drug therapy: Secondary | ICD-10-CM | POA: Insufficient documentation

## 2016-12-15 DIAGNOSIS — R05 Cough: Secondary | ICD-10-CM | POA: Diagnosis present

## 2016-12-15 LAB — RAPID STREP SCREEN (MED CTR MEBANE ONLY): STREPTOCOCCUS, GROUP A SCREEN (DIRECT): NEGATIVE

## 2016-12-15 MED ORDER — PREDNISONE 20 MG PO TABS: 40.0000 mg | ORAL_TABLET | Freq: Every day | ORAL | 0 refills | Status: DC

## 2016-12-15 MED ORDER — IPRATROPIUM-ALBUTEROL 0.5-2.5 (3) MG/3ML IN SOLN
3.0000 mL | Freq: Once | RESPIRATORY_TRACT | Status: AC
  Administered 2016-12-15: 3 mL via RESPIRATORY_TRACT
  Filled 2016-12-15: qty 3

## 2016-12-15 MED ORDER — ALBUTEROL SULFATE HFA 108 (90 BASE) MCG/ACT IN AERS: 2.0000 | INHALATION_SPRAY | RESPIRATORY_TRACT | 0 refills | Status: DC | PRN

## 2016-12-15 MED ORDER — PREDNISONE 20 MG PO TABS
40.0000 mg | ORAL_TABLET | Freq: Once | ORAL | Status: AC
  Administered 2016-12-15: 40 mg via ORAL
  Filled 2016-12-15: qty 2

## 2016-12-15 MED ORDER — AEROCHAMBER PLUS FLO-VU LARGE MISC
1.0000 | Freq: Once | Status: AC
  Administered 2016-12-15: 1
  Filled 2016-12-15 (×2): qty 1

## 2016-12-15 NOTE — ED Provider Notes (Signed)
[facility] DEPT Provider Note   CSN: [id number] Arrival date & time: 12/15/16  0740     History   Chief Complaint Chief Complaint  Patient presents with  . Sore Throat  . Cough  . Headache    HPI Rick Harris is a 51 y.o. male.  51yo M w/ h/o asthma, frequent headaches, OSA, HLD who p/w cough, sore throat, and HA.  Patient reports a one-week history of persistent cough associated with sore throat, nasal congestion, and headaches.  No associated fevers, vomiting, diarrhea, or sick contacts.  He has been using his albuterol more frequently over the past few days and feels like he is wheezing.  Last treatment was at 4 AM this morning.  He does endorse some shortness of breath that feels like previous asthma.  He has been taking Vicodin for his headaches, which he has for a previous leg injury.    The history is provided by the patient.  Sore Throat  Associated symptoms include headaches.  Cough  Associated symptoms include headaches.  Headache      Past Medical History:  Diagnosis Date  . Asthma   . Frequent headaches   . Hypercholesteremia   . OSA (obstructive sleep apnea)    mild-mod (11/2013), declined CPAP to work on weight reduction    Patient Active Problem List   Diagnosis Date Noted  . Encounter for general adult medical examination with abnormal findings 01/07/2016  . Obese 12/05/2014  . Microcytic anemia 11/24/2013  . OSA (obstructive sleep apnea)   . Bradycardia   . Hyperlipidemia     Past Surgical History:  Procedure Laterality Date  . APPENDECTOMY  2009  . NASAL SINUS SURGERY  04/1997       Home Medications    Prior to Admission medications   Medication Sig Start Date End Date Taking? Authorizing Provider  albuterol (PROVENTIL HFA;VENTOLIN HFA) 108 (90 Base) MCG/ACT inhaler Inhale 2 puffs every 4 (four) hours as needed into the lungs for wheezing or shortness of breath. 12/15/16   Little, Wenda Overland, MD    fenofibrate (TRICOR) 145 MG tablet Take 1 tablet (145 mg total) by mouth daily. 01/10/16   Golden Circle, FNP  fluticasone (FLONASE) 50 MCG/ACT nasal spray Place 2 sprays into both nostrils daily.    [provider]  HYDROcodone-acetaminophen (NORCO) 10-325 MG tablet Take 1 tablet by mouth every 8 (eight) hours as needed. 11/24/16   Edrick Kins, DPM  Ibuprofen-Famotidine 800-26.6 MG TABS Take 1 tablet by mouth 3 (three) times daily as needed. 01/07/16   Golden Circle, FNP  meloxicam (MOBIC) 15 MG tablet Take 1 tablet (15 mg total) by mouth daily. 11/27/16 12/27/16  Edrick Kins, DPM  predniSONE (DELTASONE) 20 MG tablet Take 2 tablets (40 mg total) daily by mouth. 12/15/16   Little, Wenda Overland, MD    Family History Family History  Problem Relation Age of Onset  . Alcohol abuse Mother   . Hyperlipidemia Father        not biologic  . Stroke Father        not biologic  . Hypertension Father        not biologic  . Diabetes Father        not biologic  . Lung cancer Maternal Uncle   . Heart disease Maternal Grandmother   . Glaucoma Maternal Grandmother 63  . Lung disease Maternal Grandfather     Social History Social History   Tobacco  Use  . Smoking status: Never Smoker  . Smokeless tobacco: Never Used  Substance Use Topics  . Alcohol use: Yes    Alcohol/week: 10.8 oz    Types: 6 Standard drinks or equivalent, 12 Cans of beer per week  . Drug use: No     Allergies   Patient has no known allergies.   Review of Systems Review of Systems  Respiratory: Positive for cough.   Neurological: Positive for headaches.   All other systems reviewed and are negative except that which was mentioned in HPI   Physical Exam Updated Vital Signs BP (!) 147/96 (BP Location: Left Arm)   Pulse 62   Temp 98.2 F (36.8 C) (Oral)   Resp 18   SpO2 97%   Physical Exam  Constitutional: He is oriented to person, place, and time. He appears well-developed and  well-nourished. No distress.  HENT:  Head: Normocephalic and atraumatic.  Mouth/Throat: Uvula is midline, oropharynx is clear and moist and mucous membranes are normal. No oropharyngeal exudate or posterior oropharyngeal erythema.  Moist mucous membranes  Eyes: Conjunctivae are normal. Pupils are equal, round, and reactive to light.  Neck: Neck supple.  Cardiovascular: Normal rate, regular rhythm and normal heart sounds.  No murmur heard. Pulmonary/Chest: Effort normal. No respiratory distress. He has wheezes.  Frequent cough, expiratory wheezes b/l  Abdominal: Soft. Bowel sounds are normal. He exhibits no distension. There is no tenderness.  Musculoskeletal: He exhibits no edema.  Neurological: He is alert and oriented to person, place, and time.  Fluent speech  Skin: Skin is warm and dry.  Psychiatric: He has a normal mood and affect. Judgment normal.  Nursing note and vitals reviewed.    ED Treatments / Results  Labs (all labs ordered are listed, but only abnormal results are displayed) Labs Reviewed  RAPID STREP SCREEN (NOT AT [facility])  CULTURE, GROUP A STREP [facility])    EKG  EKG Interpretation None       Radiology No results found.  Procedures Procedures (including critical care time)  Medications Ordered in ED Medications  ipratropium-albuterol (DUONEB) 0.5-2.5 (3) MG/3ML nebulizer solution 3 mL (not administered)  predniSONE (DELTASONE) tablet 40 mg (not administered)  AEROCHAMBER PLUS FLO-VU LARGE MISC 1 each (not administered)     Initial Impression / Assessment and Plan / ED Course  I have reviewed the triage vital signs and the nursing notes.  Pertinent labsthat were available during my care of the patient were reviewed by me and considered in my medical decision making (see chart for details).     SX c/w  Viral URI w/ asthma exacerbation. VS stable, normal O2 sat, normal WOB, wheezes b/l but no respiratory distress. He is well hydrated and otherwise  compensating for his viral illness. Gave duoneb, prednisone in ED w/ Rx for prednisone and albuterol to use at home. Discussed supportive measures including scheduled albuterol and reviewed return precautions including any worsening breathing problems.  Patient voiced understanding and was discharged in satisfactory condition.  Final Clinical Impressions(s) / ED Diagnoses   Final diagnoses:  Upper respiratory tract infection, unspecified type  Mild intermittent asthma with exacerbation    ED Discharge Orders        Ordered    predniSONE (DELTASONE) 20 MG tablet  Daily     12/15/16 0909    albuterol (PROVENTIL HFA;VENTOLIN HFA) 108 (90 Base) MCG/ACT inhaler  Every 4 hours PRN     12/15/16 0909       Little, Wenda Overland,  MD 12/15/16 [phone]

## 2016-12-15 NOTE — ED Notes (Signed)
Bed: WA27 Expected date:  Expected time:  Means of arrival:  Comments: 

## 2016-12-15 NOTE — ED Triage Notes (Signed)
Pt complains of sore throat, cough, headache x 5 days. Pt has hx of asthma.

## 2016-12-17 LAB — CULTURE, GROUP A STREP (THRC)

## 2016-12-22 ENCOUNTER — Ambulatory Visit (INDEPENDENT_AMBULATORY_CARE_PROVIDER_SITE_OTHER): Payer: 59

## 2016-12-22 ENCOUNTER — Encounter: Payer: Self-pay | Admitting: Podiatry

## 2016-12-22 ENCOUNTER — Ambulatory Visit (INDEPENDENT_AMBULATORY_CARE_PROVIDER_SITE_OTHER): Payer: 59 | Admitting: Podiatry

## 2016-12-22 ENCOUNTER — Other Ambulatory Visit: Payer: Self-pay | Admitting: Podiatry

## 2016-12-22 DIAGNOSIS — S99921D Unspecified injury of right foot, subsequent encounter: Secondary | ICD-10-CM

## 2016-12-22 DIAGNOSIS — M25571 Pain in right ankle and joints of right foot: Secondary | ICD-10-CM

## 2016-12-22 DIAGNOSIS — M659 Synovitis and tenosynovitis, unspecified: Secondary | ICD-10-CM

## 2016-12-22 DIAGNOSIS — M65971 Unspecified synovitis and tenosynovitis, right ankle and foot: Secondary | ICD-10-CM

## 2016-12-22 MED ORDER — NONFORMULARY OR COMPOUNDED ITEM: 2 refills | Status: DC

## 2016-12-23 ENCOUNTER — Telehealth: Payer: Self-pay | Admitting: Podiatry

## 2016-12-23 NOTE — Telephone Encounter (Signed)
Called pt and left a voicemail asking about the medical records release form he filled out and signed last Thursday 08 November. Asked the pt if he wanted only a copy of his office visit notes or did he also want a CD of his x-rays. I told him if he did want a CD of his x-rays that there is a $5.00 fee for that. Also asked if he wanted to pick them up here in the office or did he want them mailed to him. Asked pt to call me back directly at [phone] and if I don't answer to leave a message and I would call him back.

## 2016-12-23 NOTE — Progress Notes (Signed)
   HPI: 51 year old male presenting today for follow up evaluation of right ankle pain. He reports continued pain to the anterior and lateral aspects of the ankle when applying pressure to the RLE. He reports associated numbness in the heel of the right foot. He denies any new injury or trauma. He is here for further evaluation and treatment.   Past Medical History:  Diagnosis Date  . Asthma   . Frequent headaches   . Hypercholesteremia   . OSA (obstructive sleep apnea)    mild-mod (11/2013), declined CPAP to work on weight reduction     Physical Exam: General: The patient is alert and oriented x3 in no acute distress.  Dermatology: Skin is warm, dry and supple bilateral lower extremities. Negative for open lesions or macerations.  Vascular: Palpable pedal pulses bilaterally. No edema or erythema noted. Capillary refill within normal limits.  Neurological: Epicritic and protective threshold grossly intact bilaterally.   Musculoskeletal Exam: Moderate edema with pain on palpation to the right foot and ankle. Range of motion within normal limits to all pedal and ankle joints bilateral. Muscle strength 5/5 in all groups bilateral.   Radiographic Exam:  Normal osseous mineralization. Joint spaces preserved. No fracture/dislocation/boney destruction.    Assessment: - Soft tissue injury to the right foot   - Right ankle synovitis    Plan of Care:  - Patient evaluated. X-rays reviewed. - Injection of 0.5 mLs Celestone Soluspan injected into the right ankle joint.  - Continue weightbearing in CAM boot.  - Compression anklet dispensed.  - Continue taking Meloxicam daily; Vicodin when necessary.  - Return to clinic in 3 weeks.  Edrick Kins, DPM [facility]  Dr. Edrick Kins, DPM    [address], [place] [address]                Office [phone]  Fax [phone]

## 2016-12-24 ENCOUNTER — Telehealth: Payer: Self-pay | Admitting: Podiatry

## 2016-12-24 NOTE — Telephone Encounter (Signed)
This is Hervey Ard, [employer] workers' compensation carrier for [employer]. Mr. Vonada is being treated by Dr. Amalia Hailey and this claim has been transferred to me. I need to get the medical records from his visits. I only have one note in my file and that is for his work status. If you would please call me back at [phone]. Thank you.

## 2016-12-24 NOTE — Telephone Encounter (Signed)
I called Coralyn Mark back in regards to the message she left earlier. She asked if I could fax pt's office visit notes and gave me the fax number of [phone]. I told her I had faxed the records to that fax number earlier this morning to the attention of [facility] and that I included the claim number. She said she should get the fax since that is the group fax number. I gave her my direct number and fax number in case she needed to call me or fax anything to me. I told her if she does not get the fax to call me and let me know and I would get it re-faxed to her.

## 2016-12-29 ENCOUNTER — Encounter: Payer: Self-pay | Admitting: Podiatry

## 2016-12-29 NOTE — Progress Notes (Signed)
Medical records requested by pt were put up front to be mailed out to pt today, Monday 29 December 2016.

## 2017-01-07 ENCOUNTER — Ambulatory Visit (INDEPENDENT_AMBULATORY_CARE_PROVIDER_SITE_OTHER): Payer: 59 | Admitting: Family Medicine

## 2017-01-07 ENCOUNTER — Encounter: Payer: Self-pay | Admitting: Family Medicine

## 2017-01-07 ENCOUNTER — Telehealth: Payer: Self-pay | Admitting: *Deleted

## 2017-01-07 VITALS — BP 132/84 | HR 83 | Temp 98.3°F | Resp 12 | Ht 72.0 in | Wt 252.5 lb

## 2017-01-07 DIAGNOSIS — Z0000 Encounter for general adult medical examination without abnormal findings: Secondary | ICD-10-CM | POA: Diagnosis not present

## 2017-01-07 DIAGNOSIS — J4520 Mild intermittent asthma, uncomplicated: Secondary | ICD-10-CM | POA: Diagnosis not present

## 2017-01-07 DIAGNOSIS — Z131 Encounter for screening for diabetes mellitus: Secondary | ICD-10-CM

## 2017-01-07 DIAGNOSIS — D509 Iron deficiency anemia, unspecified: Secondary | ICD-10-CM

## 2017-01-07 DIAGNOSIS — E782 Mixed hyperlipidemia: Secondary | ICD-10-CM | POA: Diagnosis not present

## 2017-01-07 MED ORDER — ALBUTEROL SULFATE HFA 108 (90 BASE) MCG/ACT IN AERS: 2.0000 | INHALATION_SPRAY | RESPIRATORY_TRACT | 3 refills | Status: DC | PRN

## 2017-01-07 NOTE — Patient Instructions (Addendum)
A few things to remember from today's visit:   Routine general medical examination at a health care facility  Mixed hyperlipidemia - Plan: TSH, Lipid panel  Microcytic anemia - Plan: TSH, CBC with Differential  Diabetes mellitus screening - Plan: Comprehensive metabolic panel, Hemoglobin A1c   At least 150 minutes of moderate exercise per week, daily brisk walking for 15-30 min is a good exercise option. Healthy diet low in saturated (animal) fats and sweets and consisting of fresh fruits and vegetables, lean meats such as fish and white chicken and whole grains.   Decreased beer intake.   - Vaccines:  Tdap vaccine every 10 years.  Shingles vaccine recommended at age 56, could be given after 49 years of age but not sure about insurance coverage.  Pneumonia vaccines:  Prevnar 24 at 65 and Pneumovax at 60.   -Screening recommendations for low/normal risk males:  Screening for diabetes at age 73 and every 3 years. Earlier screening if cardiovascular risk factors.    Colon cancer screening at age 7 and until age 19.  Prostate cancer screening: some controversy, starts usually at 57: Rectal exam and PSA.  Aortic Abdominal Aneurism once between 32 and 68 years old if ever smoker.  Also recommended:  1. Dental visit- Brush and floss your teeth twice daily; visit your dentist twice a year. 2. Eye doctor- Get an eye exam at least every 2 years. 3. Helmet use- Always wear a helmet when riding a bicycle, motorcycle, rollerblading or skateboarding. 4. Safe sex- If you may be exposed to sexually transmitted infections, use a condom. 5. Seat belts- Seat belts can save your live; always wear one. 6. Smoke/Carbon Monoxide detectors- These detectors need to be installed on the appropriate level of your home. Replace batteries at least once a year. 7. Skin cancer- When out in the sun please cover up and use sunscreen 15 SPF or higher. 8. Violence- If anyone is threatening or hurting  you, please tell your healthcare provider.  9. Drink alcohol in moderation- Limit alcohol intake to one drink or less per day. Never drink and drive.  Please be sure medication list is accurate. If a new problem present, please set up appointment sooner than planned today.

## 2017-01-07 NOTE — Telephone Encounter (Signed)
Ms Rick Harris - [employer] states she has the medical records from our office but does not have pt's Work Status, please call or fax to [phone].

## 2017-01-07 NOTE — Progress Notes (Signed)
HPI:   [name] is a 51 y.o. male, who is here today to establish care with me.  Former PCP: Mauricio Po, FNP Last preventive routine visit: 01/07/16.Marland Kitchen  He has no concerns today and would like to have this visit as his routine CPE.  Chronic medical problems: Asthma, HLD, allergic rhinitis, OSA not on CPAP,and ankle pain among some.   Hyperlipidemia:  Currently on non pharmacologic treatment. Following a low fat diet: Not consistently.  He has not taken Fenofibrate for 2 years.  Lab Results  Component Value Date   CHOL 289 (H) 01/07/2016   HDL 49.40 01/07/2016   LDLCALC 174 (H) 07/29/2013   LDLDIRECT 175.0 01/07/2016   TRIG (H) 01/07/2016    456.0 Triglyceride is over 400; calculations on Lipids are invalid.   CHOLHDL 6 01/07/2016   Hx of anemia, he denies blood in stool or gross hematuria.   Asthma: He is on Albuterol inh, which he uses about 2 times per months. Denies nocturnal symptoms. Last asthma exacerbation 12/15/16.  He lives with his wife and son. No hx of tobacco use.   He drinks beer, about 12 cans on fridays.  He does not exercise regularly.  Last eye exam in 12/2015. Denies Hx of STD's.    Review of Systems  Constitutional: Negative for activity change, appetite change, fatigue and fever.  HENT: Positive for congestion, postnasal drip and rhinorrhea. Negative for dental problem, nosebleeds, sinus pain, sore throat, trouble swallowing and voice change.   Eyes: Negative for redness and visual disturbance.  Respiratory: Negative for cough, shortness of breath and wheezing.   Cardiovascular: Negative for chest pain, palpitations and leg swelling.  Gastrointestinal: Negative for abdominal pain, blood in stool, nausea and vomiting.  Endocrine: Negative for cold intolerance, heat intolerance, polydipsia, polyphagia and polyuria.  Genitourinary: Negative for decreased urine volume, dysuria, genital sores, hematuria and testicular  pain.       Nocturia x 1, stable for years.  Musculoskeletal: Positive for arthralgias. Negative for back pain, joint swelling and myalgias.  Skin: Negative for color change and rash.  Allergic/Immunologic: Positive for environmental allergies.  Neurological: Negative for dizziness, syncope, weakness and headaches.  Hematological: Negative for adenopathy. Does not bruise/bleed easily.  Psychiatric/Behavioral: Negative for confusion and sleep disturbance. The patient is not nervous/anxious.        Current Outpatient Medications on File Prior to Visit  Medication Sig Dispense Refill  . fluticasone (FLONASE) 50 MCG/ACT nasal spray Place 2 sprays into both nostrils daily.    . NONFORMULARY OR COMPOUNDED ITEM [facility]  [place]illes Tendonitis Cream- Diclofenac 3%, Baclofen 2%, Bupivacaine 1%, Doxepin 5%, Gabapentin 6%, Ibuprofen 3%, Pentoxifylline 3% Apply 1-2 grams to affected area 3-4 times daily Qty. 120 gm 3 refills (Patient not taking: Reported on 01/07/2017) 1 each 2   No current facility-administered medications on file prior to visit.      Past Medical History:  Diagnosis Date  . Asthma   . Frequent headaches   . Hypercholesteremia   . OSA (obstructive sleep apnea)    mild-mod (11/2013), declined CPAP to work on weight reduction   No Known Allergies  Family History  Problem Relation Age of Onset  . Alcohol abuse Mother   . Hyperlipidemia Father        not biologic  . Stroke Father        not biologic  . Hypertension Father        not biologic  . Diabetes  Father        not biologic  . Lung cancer Maternal Uncle   . Heart disease Maternal Grandmother   . Glaucoma Maternal Grandmother 63  . Lung disease Maternal Grandfather     Social History   Socioeconomic History  . Marital status: Legally Separated    Spouse name: None  . Number of children: 4  . Years of education: 107  . Highest education level: None  Social Needs  . Financial resource strain:  None  . Food insecurity - worry: None  . Food insecurity - inability: None  . Transportation needs - medical: None  . Transportation needs - non-medical: None  Occupational History  . Occupation: Art therapist  Tobacco Use  . Smoking status: Never Smoker  . Smokeless tobacco: Never Used  Substance and Sexual Activity  . Alcohol use: Yes    Alcohol/week: 10.8 oz    Types: 6 Standard drinks or equivalent, 12 Cans of beer per week  . Drug use: No  . Sexual activity: None  Other Topics Concern  . None  Social History Narrative   Fun: Swim, drink beer     Vitals:   01/07/17 1415  BP: 132/84  Pulse: 83  Resp: 12  Temp: 98.3 F (36.8 C)  SpO2: 99%    Body mass index is 34.25 kg/m.   Physical Exam  Nursing note and vitals reviewed. Constitutional: He is oriented to person, place, and time. He appears well-developed. No distress.  HENT:  Head: Normocephalic and atraumatic.  Right Ear: Hearing, tympanic membrane, external ear and ear canal normal.  Left Ear: Hearing, tympanic membrane, external ear and ear canal normal.  Nose: Rhinorrhea present.  Mouth/Throat: Uvula is midline, oropharynx is clear and moist and mucous membranes are normal.  Hypertrophic turbinates, post nasal drainage.  Eyes: Conjunctivae are normal. Pupils are equal, round, and reactive to light. Right eye exhibits abnormal extraocular motion (baseline). Left eye exhibits normal extraocular motion.  Neck: Normal range of motion. No tracheal deviation present. No thyromegaly present.  Cardiovascular: Normal rate and regular rhythm.  No murmur heard. Pulses:      Dorsalis pedis pulses are 2+ on the right side, and 2+ on the left side.  Respiratory: Effort normal and breath sounds normal. No respiratory distress.  GI: Soft. He exhibits no mass. There is no tenderness.  Genitourinary:  Genitourinary Comments: Refused,no concerns.  Musculoskeletal: He exhibits no edema or tenderness.  No major deformities  appreciated and no signs of synovitis.  Lymphadenopathy:    He has no cervical adenopathy.       Right: No supraclavicular adenopathy present.       Left: No supraclavicular adenopathy present.  Neurological: He is alert and oriented to person, place, and time. He has normal strength. No cranial nerve deficit or sensory deficit. Coordination and gait normal.  Reflex Scores:      Bicep reflexes are 2+ on the right side and 2+ on the left side.      Patellar reflexes are 2+ on the right side and 2+ on the left side. Skin: Skin is warm. No erythema.  Psychiatric: He has a normal mood and affect.    ASSESSMENT AND PLAN:   [name] was seen today for establish care and annual exam.  Diagnoses and all orders for this visit:  Lab Results  Component Value Date   CHOL 302 (H) 01/09/2017   HDL 51.30 01/09/2017   LDLCALC 174 (H) 07/29/2013   LDLDIRECT 226.0 01/09/2017  TRIG 207.0 (H) 01/09/2017   CHOLHDL 6 01/09/2017   Lab Results  Component Value Date   CREATININE 1.11 01/09/2017   BUN 18 01/09/2017   NA 138 01/09/2017   K 4.2 01/09/2017   CL 104 01/09/2017   CO2 26 01/09/2017   Lab Results  Component Value Date   TSH 4.95 (H) 01/09/2017   Lab Results  Component Value Date   WBC 6.9 01/09/2017   HGB 13.0 01/09/2017   HCT 41.3 01/09/2017   MCV 75.8 (L) 01/09/2017   PLT 229.0 01/09/2017   Lab Results  Component Value Date   HGBA1C 6.2 01/09/2017    Routine general medical examination at a health care facility  We discussed the importance of regular physical activity and healthy diet for prevention of chronic illness and/or complications. Preventive guidelines reviewed. Vaccination up to date. Refused flu vaccine and pneumovax. Next CPE in a year.  The 10-year ASCVD risk score Mikey Bussing DC Brooke Bonito., et al., 2013) is: 6%   Values used to calculate the score:     Age: 71 years     Sex: Male     Is Non-Hispanic African American: Yes     Diabetic: No     Tobacco smoker:  No     Systolic Blood Pressure: 778 mmHg     Is BP treated: No     HDL Cholesterol: 51.3 mg/dL     Total Cholesterol: 302 mg/dL  Mixed hyperlipidemia  Continue non pharmacologic treatment for now. Low fat diet encouraged. Limit beer intake. Further recommendations will be given according to lab results.  -     TSH; Future -     Lipid panel; Future  Microcytic anemia  Further recommendations will be given according to CBC results.  -     TSH; Future -     CBC with Differential; Future  Diabetes mellitus screening -     Comprehensive metabolic panel; Future -     Hemoglobin A1c; Future  Asthma in adult, mild intermittent, uncomplicated  Otherwise well controlled. No changes in current management. F/U in 4-6 months.  -     albuterol (PROVENTIL HFA;VENTOLIN HFA) 108 (90 Base) MCG/ACT inhaler; Inhale 2 puffs into the lungs every 4 (four) hours as needed for wheezing or shortness of breath.      In regard to OSA he is not interested in trying CPAP.      Austan Nicholl G. [place], MD  [facility]. [place] office.

## 2017-01-09 ENCOUNTER — Other Ambulatory Visit (INDEPENDENT_AMBULATORY_CARE_PROVIDER_SITE_OTHER): Payer: 59

## 2017-01-09 DIAGNOSIS — D509 Iron deficiency anemia, unspecified: Secondary | ICD-10-CM

## 2017-01-09 DIAGNOSIS — Z131 Encounter for screening for diabetes mellitus: Secondary | ICD-10-CM | POA: Diagnosis not present

## 2017-01-09 DIAGNOSIS — E782 Mixed hyperlipidemia: Secondary | ICD-10-CM | POA: Diagnosis not present

## 2017-01-09 LAB — CBC WITH DIFFERENTIAL/PLATELET
Basophils Absolute: 0.1 10*3/uL (ref 0.0–0.1)
Basophils Relative: 1.3 % (ref 0.0–3.0)
Eosinophils Absolute: 0.7 10*3/uL (ref 0.0–0.7)
Eosinophils Relative: 9.6 % — ABNORMAL HIGH (ref 0.0–5.0)
HCT: 41.3 % (ref 39.0–52.0)
Hemoglobin: 13 g/dL (ref 13.0–17.0)
Lymphocytes Relative: 38.4 % (ref 12.0–46.0)
Lymphs Abs: 2.7 10*3/uL (ref 0.7–4.0)
MCHC: 31.5 g/dL (ref 30.0–36.0)
MCV: 75.8 fl — ABNORMAL LOW (ref 78.0–100.0)
Monocytes Absolute: 0.3 10*3/uL (ref 0.1–1.0)
Monocytes Relative: 4.7 % (ref 3.0–12.0)
Neutro Abs: 3.2 10*3/uL (ref 1.4–7.7)
Neutrophils Relative %: 46 % (ref 43.0–77.0)
Platelets: 229 10*3/uL (ref 150.0–400.0)
RBC: 5.44 Mil/uL (ref 4.22–5.81)
RDW: 14.2 % (ref 11.5–15.5)
WBC: 6.9 10*3/uL (ref 4.0–10.5)

## 2017-01-09 LAB — COMPREHENSIVE METABOLIC PANEL
ALT: 29 U/L (ref 0–53)
AST: 20 U/L (ref 0–37)
Albumin: 4.1 g/dL (ref 3.5–5.2)
Alkaline Phosphatase: 65 U/L (ref 39–117)
BUN: 18 mg/dL (ref 6–23)
CO2: 26 mEq/L (ref 19–32)
Calcium: 9.2 mg/dL (ref 8.4–10.5)
Chloride: 104 mEq/L (ref 96–112)
Creatinine, Ser: 1.11 mg/dL (ref 0.40–1.50)
GFR: 90.44 mL/min (ref >60.00)
Glucose, Bld: 90 mg/dL (ref 70–99)
Potassium: 4.2 mEq/L (ref 3.5–5.1)
Sodium: 138 mEq/L (ref 135–145)
Total Bilirubin: 0.4 mg/dL (ref 0.2–1.2)
Total Protein: 7.4 g/dL (ref 6.0–8.3)

## 2017-01-09 LAB — LIPID PANEL
CHOL/HDL RATIO: 6
Cholesterol: 302 mg/dL — ABNORMAL HIGH (ref 0–200)
HDL: 51.3 mg/dL (ref >39.00)
NONHDL: 250.8
Triglycerides: 207 mg/dL — ABNORMAL HIGH (ref 0.0–149.0)
VLDL: 41.4 mg/dL — AB (ref 0.0–40.0)

## 2017-01-09 LAB — LDL CHOLESTEROL, DIRECT: Direct LDL: 226 mg/dL

## 2017-01-09 LAB — TSH: TSH: 4.95 u[IU]/mL — ABNORMAL HIGH (ref 0.35–4.50)

## 2017-01-09 LAB — HEMOGLOBIN A1C: Hgb A1c MFr Bld: 6.2 % (ref 4.6–6.5)

## 2017-01-19 ENCOUNTER — Ambulatory Visit: Payer: 59 | Admitting: Podiatry

## 2017-01-21 ENCOUNTER — Ambulatory Visit: Payer: 59 | Admitting: Podiatry

## 2017-02-04 ENCOUNTER — Ambulatory Visit: Payer: 59 | Admitting: Podiatry

## 2017-02-18 ENCOUNTER — Encounter: Payer: Self-pay | Admitting: Podiatry

## 2017-02-18 ENCOUNTER — Ambulatory Visit (INDEPENDENT_AMBULATORY_CARE_PROVIDER_SITE_OTHER): Payer: Self-pay | Admitting: Podiatry

## 2017-02-18 DIAGNOSIS — M659 Synovitis and tenosynovitis, unspecified: Secondary | ICD-10-CM

## 2017-02-18 DIAGNOSIS — R202 Paresthesia of skin: Secondary | ICD-10-CM

## 2017-02-22 NOTE — Progress Notes (Signed)
   HPI: 52 year old male presenting today for follow-up evaluation of right ankle pain.  He reports improvement of the right ankle.  He does report some associated numbness of the right heel.  He has been taking meloxicam which has helped alleviate his symptoms.  Patient is here for further evaluation and treatment.   Past Medical History:  Diagnosis Date  . Asthma   . Frequent headaches   . Hypercholesteremia   . OSA (obstructive sleep apnea)    mild-mod (11/2013), declined CPAP to work on weight reduction     Physical Exam: General: The patient is alert and oriented x3 in no acute distress.  Dermatology: Skin is warm, dry and supple bilateral lower extremities. Negative for open lesions or macerations.  Vascular: Palpable pedal pulses bilaterally. No edema or erythema noted. Capillary refill within normal limits.  Neurological: Paresthesia with light touch of the right heel.  Musculoskeletal Exam: Range of motion within normal limits to all pedal and ankle joints bilateral. Muscle strength 5/5 in all groups bilateral.    Assessment: -Right ankle synovitis-resolved -Right heel paresthesia/numbness   Plan of Care:  -Patient evaluated. -Ankle brace dispensed. -Recommended good shoe gear. -Explained that heel numbness should resolve over time. -Continue taking meloxicam as needed. -Return to clinic as needed.   Edrick Kins, DPM [facility]  Dr. Edrick Kins, DPM    [address], [place] [address]                Office [phone]  Fax [phone]

## 2017-02-25 ENCOUNTER — Encounter: Payer: Self-pay | Admitting: Podiatry

## 2017-02-25 NOTE — Progress Notes (Signed)
Requested medical records were emailed to [employer] with [facility] at [email or web address] and requested itemized billing statements were put up front to be mailed to [place] [address].

## 2017-03-05 ENCOUNTER — Other Ambulatory Visit: Payer: Self-pay | Admitting: Family Medicine

## 2017-03-05 DIAGNOSIS — J4520 Mild intermittent asthma, uncomplicated: Secondary | ICD-10-CM

## 2017-03-05 MED ORDER — ALBUTEROL SULFATE HFA 108 (90 BASE) MCG/ACT IN AERS: 2.0000 | INHALATION_SPRAY | RESPIRATORY_TRACT | 3 refills | Status: DC | PRN

## 2017-03-05 NOTE — Telephone Encounter (Signed)
Rx sent to pharmacy   

## 2017-03-05 NOTE — Telephone Encounter (Signed)
Copied from [place] [id number]. Topic: Quick Communication - Rx Refill/Question >> Mar 05, 2017  2:34 PM Oliver Pila B wrote: Medication: albuterol (PROVENTIL HFA;VENTOLIN HFA) 108 (90 Base) MCG/ACT inhaler [[id number]]    Has the patient contacted their pharmacy? No.   (Agent: If no, request that the patient contact the pharmacy for the refill.)   Preferred Pharmacy (with phone number or street name): [facility] [place]   Agent: Please be advised that RX refills may take up to 3 business days. We ask that you follow-up with your pharmacy.

## 2017-03-11 ENCOUNTER — Ambulatory Visit (INDEPENDENT_AMBULATORY_CARE_PROVIDER_SITE_OTHER): Payer: Self-pay | Admitting: Family Medicine

## 2017-03-11 ENCOUNTER — Encounter: Payer: Self-pay | Admitting: Family Medicine

## 2017-03-11 VITALS — BP 130/76 | HR 65 | Temp 98.2°F | Resp 16 | Ht 72.0 in | Wt 252.4 lb

## 2017-03-11 DIAGNOSIS — J4520 Mild intermittent asthma, uncomplicated: Secondary | ICD-10-CM

## 2017-03-11 DIAGNOSIS — J069 Acute upper respiratory infection, unspecified: Secondary | ICD-10-CM

## 2017-03-11 DIAGNOSIS — J309 Allergic rhinitis, unspecified: Secondary | ICD-10-CM | POA: Insufficient documentation

## 2017-03-11 MED ORDER — PREDNISONE 20 MG PO TABS: 40.0000 mg | ORAL_TABLET | Freq: Every day | ORAL | 0 refills | Status: AC

## 2017-03-11 MED ORDER — ALBUTEROL SULFATE HFA 108 (90 BASE) MCG/ACT IN AERS: 2.0000 | INHALATION_SPRAY | RESPIRATORY_TRACT | 3 refills | Status: DC | PRN

## 2017-03-11 MED ORDER — MOMETASONE FUROATE 50 MCG/ACT NA SUSP: 2.0000 | Freq: Every day | NASAL | 5 refills | Status: DC

## 2017-03-11 NOTE — Patient Instructions (Signed)
A few things to remember from today's visit:   Asthma in adult, mild intermittent, uncomplicated  Allergic rhinitis, unspecified seasonality, unspecified trigger Albuterol inh 2 puff every 6 hours for a week then as needed for wheezing or shortness of breath.  Stop Flonase and start Nasonex.  There are 2 forms of allergic rhinitis: . Seasonal (hay fever): Caused by an allergy to pollen and/or mold spores in the air. Pollen is the fine powder that comes from the stamen of flowering plants. It can be carried through the air and is easily inhaled. Symptoms are seasonal and usually occur in spring, late summer, and fall. Marland Kitchen Perennial: Caused by other allergens such as dust mites, pet hair or dander, or mold. Symptoms occur year-round.  Symptoms: Your symptoms can vary, depending on the severity of your allergies. Symptoms can include: Sneezing, coughing.itching (mostly eyes, nose, mouth, throat and skin),runny nose,stuffy nose.headache,pressure in the nose and cheeks,ear fullness and popping, sore throat.watery, red, or swollen eyes,dark circles under your eyes,trouble smelling, and sometimes hives.  Allergic rhinitis cannot be prevented. You can help your symptoms by avoiding the things that you are allergic, including: . Keeping windows closed. This is especially important during high-pollen seasons. . Washing your hands after petting animals. . Using dust- and mite-proof bedding and mattress covers. . Wearing glasses outside to protect your eyes. . Showering before bed to wash off allergens from hair and skin. You can also avoid things that can make your symptoms worse, such as: . aerosol sprays . air pollution . cold temperatures . humidity . irritating fumes . tobacco smoke . wind . wood smoke.   Antihistamines help reduce the sneezing, runny nose, and itchiness of allergies. These come in pill form and as nasal sprays. Allegra,Zyrtec,or Claritin are some examples. Decongestants,  such as pseudoephedrine and phenylephrine, help temporarily relieve the stuffy nose of allergies. Decongestants are found in many medicines and come as pills, nose sprays, and nose drops. They could increase heart rate and cause tachycardia and tremor. Nasal Afrin should not be used for more than 3 days because you can become dependent on them. This causes you to feel even more stopped-up when you try to quit using them.  Nasal sprays: steroids or antihistaminics. Over the counter intranasal sterids: Nasocort,Rhinocort,or Flonase.You won't notice their benefits for up to 2 weeks after starting them. Allergy shots or sublingual tablets when other treatment do not help.This is done by immunologists.    Please be sure medication list is accurate. If a new problem present, please set up appointment sooner than planned today.

## 2017-03-11 NOTE — Assessment & Plan Note (Signed)
Today lung auscultation is negative, I do not think imaging is needed at this time. Recommend Albuterol inh 2 puff every 6 hours for a week then as needed for wheezing or shortness of breath.  Prednisone 40 mg for 3 days recommended, side effects discussed. Instructed about warning signs. Follow-up as needed.

## 2017-03-11 NOTE — Assessment & Plan Note (Addendum)
Since Flonase nasal spray is not helping, he will try Nasonex. Some side effects of intranasal steroids discussed. Nasal irrigations with saline as needed.

## 2017-03-11 NOTE — Progress Notes (Addendum)
ACUTE VISIT  HPI:  Chief Complaint  Patient presents with  . Chest congestion    started last week Wednesday  . Wheezing    [name] is a 52 y.o.male here today complaining of 7 days of respiratory symptoms.  Initially he had subjective fever and sore throat as well as right frontal headache, the symptoms resolved. He is having intermittent productive cough, wheezing, and shortness of breath.  Initially, was nonproductive and now he is having small amount of sputum, denies hemoptysis. Cough is exacerbated by deep breathing, states that he "cannot inhale deep" because he starts with coughing spell.  He is not sure about exacerbating factor, symptoms are present at rest. Symptoms alleviated by Albuterol inhaler, which he used 2 days ago.   Cough  This is a new problem. The current episode started in the past 7 days. The problem has been unchanged. The cough is productive of sputum. Associated symptoms include ear congestion, nasal congestion, postnasal drip, rhinorrhea, shortness of breath and wheezing. Pertinent negatives include no chest pain, chills, ear pain, eye redness, fever, headaches, heartburn, hemoptysis, myalgias, rash or sore throat. The symptoms are aggravated by other. He has tried a beta-agonist inhaler for the symptoms. The treatment provided mild relief. His past medical history is significant for asthma and environmental allergies.    No Hx of recent travel. No sick contact. No known insect bite.  Hx of allergies: History of asthma and allergic rhinitis.  Nasal congestion the following. He is currently on Flonase nasal spray but it is not helping. He ran out of Albuterol 2 days ago.   OTC medications for this problem: He took Sudafed when symptoms first started.  Symptoms otherwise stable.   Review of Systems  Constitutional: Negative for activity change, appetite change, chills, fatigue and fever.  HENT: Positive for congestion,  postnasal drip, rhinorrhea and sinus pressure. Negative for ear pain, mouth sores, sore throat, trouble swallowing and voice change.   Eyes: Negative for discharge, redness and itching.  Respiratory: Positive for cough, shortness of breath and wheezing. Negative for hemoptysis and chest tightness.   Cardiovascular: Negative for chest pain and leg swelling.  Gastrointestinal: Negative for abdominal pain, diarrhea, heartburn, nausea and vomiting.  Musculoskeletal: Negative for gait problem, myalgias and neck pain.  Skin: Negative for rash.  Allergic/Immunologic: Positive for environmental allergies.  Neurological: Negative for weakness and headaches.  Hematological: Negative for adenopathy. Does not bruise/bleed easily.  Psychiatric/Behavioral: Negative for confusion. The patient is nervous/anxious.       Current Outpatient Medications on File Prior to Visit  Medication Sig Dispense Refill  . meloxicam (MOBIC) 15 MG tablet Take 15 mg by mouth daily.  1  . NONFORMULARY OR COMPOUNDED ITEM [facility]  Achilles Tendonitis Cream- Diclofenac 3%, Baclofen 2%, Bupivacaine 1%, Doxepin 5%, Gabapentin 6%, Ibuprofen 3%, Pentoxifylline 3% Apply 1-2 grams to affected area 3-4 times daily Qty. 120 gm 3 refills (Patient not taking: Reported on 03/11/2017) 1 each 2   No current facility-administered medications on file prior to visit.      Past Medical History:  Diagnosis Date  . Asthma   . Frequent headaches   . Hypercholesteremia   . OSA (obstructive sleep apnea)    mild-mod (11/2013), declined CPAP to work on weight reduction   No Known Allergies  Social History   Socioeconomic History  . Marital status: Legally Separated    Spouse name: None  . Number of children: 4  .  Years of education: 57  . Highest education level: None  Social Needs  . Financial resource strain: None  . Food insecurity - worry: None  . Food insecurity - inability: None  . Transportation needs - medical:  None  . Transportation needs - non-medical: None  Occupational History  . Occupation: Art therapist  Tobacco Use  . Smoking status: Never Smoker  . Smokeless tobacco: Never Used  Substance and Sexual Activity  . Alcohol use: Yes    Alcohol/week: 10.8 oz    Types: 6 Standard drinks or equivalent, 12 Cans of beer per week  . Drug use: No  . Sexual activity: None  Other Topics Concern  . None  Social History Narrative   Fun: Swim, drink beer     Vitals:   03/11/17 0827  BP: 130/76  Pulse: 65  Resp: 16  Temp: 98.2 F (36.8 C)  SpO2: 95%   Body mass index is 34.23 kg/m.   Physical Exam  Nursing note and vitals reviewed. Constitutional: He is oriented to person, place, and time. He appears well-developed. He does not appear ill. No distress.  HENT:  Head: Normocephalic and atraumatic.  Right Ear: Tympanic membrane, external ear and ear canal normal.  Left Ear: Tympanic membrane, external ear and ear canal normal.  Nose: Right sinus exhibits no maxillary sinus tenderness and no frontal sinus tenderness. Left sinus exhibits no maxillary sinus tenderness and no frontal sinus tenderness.  Mouth/Throat: Oropharynx is clear and moist and mucous membranes are normal.  Hypertrophic, dry turbinates. Nasal voice. Postnasal drainage.  Eyes: Conjunctivae are normal.  Cardiovascular: Normal rate and regular rhythm.  No murmur heard. Respiratory: Effort normal and breath sounds normal. No stridor. No respiratory distress.  Musculoskeletal: He exhibits no edema.  Lymphadenopathy:    He has no cervical adenopathy.  Neurological: He is alert and oriented to person, place, and time. He has normal strength. Gait normal.  Skin: Skin is warm. No rash noted. No erythema.  Psychiatric: He has a normal mood and affect. His speech is normal.  Well groomed, good eye contact.     ASSESSMENT AND PLAN:  [name] was seen today for chest congestion and wheezing.  Diagnoses and all orders for  this visit:  Asthma in adult, mild intermittent, uncomplicated -     predniSONE (DELTASONE) 20 MG tablet; Take 2 tablets (40 mg total) by mouth daily with breakfast for 3 days. -     albuterol (PROVENTIL HFA;VENTOLIN HFA) 108 (90 Base) MCG/ACT inhaler; Inhale 2 puffs into the lungs every 4 (four) hours as needed for wheezing or shortness of breath.  Allergic rhinitis, unspecified seasonality, unspecified trigger -     predniSONE (DELTASONE) 20 MG tablet; Take 2 tablets (40 mg total) by mouth daily with breakfast for 3 days. -     mometasone (NASONEX) 50 MCG/ACT nasal spray; Place 2 sprays into the nose daily.  Viral upper respiratory tract infection  URI resolved and now with residual symptoms. I explained cough congestion can last few weeks. Instructed to monitor for fever all other signs of complications. Follow-up as needed.    -Mr. Rayne Du was advised to seek attention immediately if symptoms worsen or to follow if they persist or new concerns arise.       Eathen Budreau G. Martinique, MD  [facility]. [place] office.

## 2017-04-14 ENCOUNTER — Telehealth: Payer: Self-pay | Admitting: *Deleted

## 2017-04-14 MED ORDER — MELOXICAM 15 MG PO TABS: 15.0000 mg | ORAL_TABLET | Freq: Every day | ORAL | 0 refills | Status: DC

## 2017-04-14 NOTE — Telephone Encounter (Signed)
Refill request for meloxicam. Dr. Amalia Hailey ordered refill once and if continuing to have pain pt needs an appt prior to future refills.

## 2017-04-15 ENCOUNTER — Telehealth: Payer: Self-pay | Admitting: *Deleted

## 2017-04-15 NOTE — Telephone Encounter (Signed)
Entered in error

## 2018-03-26 ENCOUNTER — Ambulatory Visit
Admission: RE | Admit: 2018-03-26 | Discharge: 2018-03-26 | Disposition: A | Discharge status: Home / Self Care | Payer: 59 | Source: Ambulatory Visit | Attending: Family Medicine | Admitting: Family Medicine

## 2018-03-26 ENCOUNTER — Other Ambulatory Visit: Payer: Self-pay | Admitting: Family Medicine

## 2018-03-26 DIAGNOSIS — M25571 Pain in right ankle and joints of right foot: Secondary | ICD-10-CM

## 2018-05-04 ENCOUNTER — Telehealth: Payer: Self-pay | Admitting: *Deleted

## 2018-05-04 NOTE — Telephone Encounter (Signed)
I prefer webex visit. It has been over a year sine I saw him.  Thanks, BJ

## 2018-05-04 NOTE — Telephone Encounter (Signed)
Copied from [place] [id number]. Topic: Quick Communication - See Telephone Encounter >> May 03, 2018 11:19 AM Loma Boston wrote: CRM for notification. See Telephone encounter for: 05/03/18.PT cancelled CPE for Wed 25, but requested Telephonic visit with dr. Due to high blood pressure and back pain. Office called by pec,  who advised note for request and pt advised that office will be calling to scheduled >> May 03, 2018 11:25 AM Loma Boston wrote: Call to sch Telephonic visit

## 2018-05-04 NOTE — Telephone Encounter (Signed)
Patient's wife is calling in to ask about the visit for her husband- she has not heard back from the office- advise wife she should be hearing from the office. They do have a computer to use for video and available automatic BP cuff. Best contact # [phone].

## 2018-05-04 NOTE — Telephone Encounter (Signed)
WebEx appointment made for 12:15 pm on 05/05/2018.

## 2018-05-05 ENCOUNTER — Encounter: Payer: Self-pay | Admitting: Family Medicine

## 2018-05-05 ENCOUNTER — Other Ambulatory Visit: Payer: Self-pay

## 2018-05-05 ENCOUNTER — Ambulatory Visit (INDEPENDENT_AMBULATORY_CARE_PROVIDER_SITE_OTHER): Payer: 59 | Admitting: Family Medicine

## 2018-05-05 ENCOUNTER — Encounter: Payer: 59 | Admitting: Family Medicine

## 2018-05-05 VITALS — BP 147/95 | HR 64 | Resp 12 | Wt 258.0 lb

## 2018-05-05 DIAGNOSIS — R7989 Other specified abnormal findings of blood chemistry: Secondary | ICD-10-CM

## 2018-05-05 DIAGNOSIS — J309 Allergic rhinitis, unspecified: Secondary | ICD-10-CM | POA: Diagnosis not present

## 2018-05-05 DIAGNOSIS — I10 Essential (primary) hypertension: Secondary | ICD-10-CM | POA: Diagnosis not present

## 2018-05-05 DIAGNOSIS — J4520 Mild intermittent asthma, uncomplicated: Secondary | ICD-10-CM

## 2018-05-05 DIAGNOSIS — M545 Low back pain, unspecified: Secondary | ICD-10-CM

## 2018-05-05 DIAGNOSIS — E782 Mixed hyperlipidemia: Secondary | ICD-10-CM | POA: Diagnosis not present

## 2018-05-05 DIAGNOSIS — R7303 Prediabetes: Secondary | ICD-10-CM

## 2018-05-05 MED ORDER — AMLODIPINE BESYLATE 5 MG PO TABS: 2.5000 mg | ORAL_TABLET | Freq: Every day | ORAL | 1 refills | Status: DC

## 2018-05-05 MED ORDER — METHOCARBAMOL 500 MG PO TABS: 500.0000 mg | ORAL_TABLET | Freq: Three times a day (TID) | ORAL | 1 refills | Status: DC | PRN

## 2018-05-05 MED ORDER — ALBUTEROL SULFATE HFA 108 (90 BASE) MCG/ACT IN AERS: 2.0000 | INHALATION_SPRAY | RESPIRATORY_TRACT | 3 refills | Status: DC | PRN

## 2018-05-05 NOTE — Assessment & Plan Note (Signed)
Recommend healthy lifestyle for primary prevention. Further recommendation will be given according to A1c.

## 2018-05-05 NOTE — Progress Notes (Signed)
Virtual Visit via Video Note  I connected with@ on 05/05/18 at 12:15 PM EDT by a video enabled telemedicine application and verified that I am speaking with the correct person using two identifiers.  Location patient: home Location provider:office Persons participating in the virtual visit: patient, provider Today we are doing chronic disease management.  I discussed the limitations of evaluation and management by telemedicine and the availability of in person appointments. The patient expressed understanding and agreed to proceed.   History of Present Illness: Rick Harris is a 53 year old that is been seen today via WebEx, his wife is assisting with the camera. Last follow-up visit in 02/2017.  Today he is complaining about lower back pain, it is not radiated, it has been intermittently for "a while." It has been constant for the past 2 weeks, he denies recent trauma or unusual physical activity. It is exacerbated by movement and alleviated by rest. Pain is not radiated denies numbness or tingling. Negative for saddle anesthesia or urine/bowel incontinence. He has been taking Mobic 15 mg daily as needed. He would like to try ibuprofen. Pain is 8/10. He has not been evaluated by orthopedist in the past.  Asthma: Currently he is on albuterol 2 puffs every 4-6 hours as needed. Currently he is not having any respiratory symptoms. He does not need albuterol inhaler daily.  Allergy rhinitis: Currently he is on Nasonex nasal spray daily as needed. He denies rhinorrhea, nasal congestion, postnasal drainage.  Elevated BP readings: He is concerned about elevated BP for the past few weeks. Today BP is 147/95. All the readings: 139/93, 159/94, 151/96. Denies prior history of hypertension. Negative for headache, chest pain, palpitation, dyspnea, or edema.  Lab Results  Component Value Date   CREATININE 1.11 01/09/2017   BUN 18 01/09/2017   NA 138 01/09/2017   K 4.2 01/09/2017   CL 104  01/09/2017   CO2 26 01/09/2017    Prediabetes: According to his wife he is following a healthful diet. He has not been exercising regularly. Current weight 258 pounds.  Denies abdominal pain, nausea,vomiting, polydipsia,polyuria, or polyphagia.  Lab Results  Component Value Date   HGBA1C 6.2 01/09/2017     Hyperlipidemia: Currently he is not on pharmacologic treatment. He is trying to follow a low-fat diet. In the past he was on fenofibrate.  Lab Results  Component Value Date   CHOL 302 (H) 01/09/2017   HDL 51.30 01/09/2017   LDLCALC 174 (H) 07/29/2013   LDLDIRECT 226.0 01/09/2017   TRIG 207.0 (H) 01/09/2017   CHOLHDL 6 01/09/2017   Abnormal TSH He has not noted dysphagia, palpitations, changes in bowel habits, tremor, cold/heat intolerance, or abnormal weight loss.  Lab Results  Component Value Date   TSH 4.95 (H) 01/09/2017    ROS: See pertinent positives and negatives per HPI.  Past Medical History:  Diagnosis Date  . Asthma   . Frequent headaches   . Hypercholesteremia   . OSA (obstructive sleep apnea)    mild-mod (11/2013), declined CPAP to work on weight reduction   Current Outpatient Medications on File Prior to Visit  Medication Sig Dispense Refill  . mometasone (NASONEX) 50 MCG/ACT nasal spray Place 2 sprays into the nose daily. 17 g 5  . NONFORMULARY OR COMPOUNDED ITEM [facility]  Achilles Tendonitis Cream- Diclofenac 3%, Baclofen 2%, Bupivacaine 1%, Doxepin 5%, Gabapentin 6%, Ibuprofen 3%, Pentoxifylline 3% Apply 1-2 grams to affected area 3-4 times daily Qty. 120 gm 3 refills (Patient not taking: Reported on 03/11/2017)  1 each 2   No current facility-administered medications on file prior to visit.     Past Surgical History:  Procedure Laterality Date  . APPENDECTOMY  2009  . NASAL SINUS SURGERY  04/1997    Family History  Problem Relation Age of Onset  . Alcohol abuse Mother   . Hyperlipidemia Father        not biologic  .  Stroke Father        not biologic  . Hypertension Father        not biologic  . Diabetes Father        not biologic  . Lung cancer Maternal Uncle   . Heart disease Maternal Grandmother   . Glaucoma Maternal Grandmother 63  . Lung disease Maternal Grandfather    Social History   Socioeconomic History  . Marital status: Married    Spouse name: Not on file  . Number of children: 4  . Years of education: 51  . Highest education level: Not on file  Occupational History  . Occupation: Art therapist  Social Needs  . Financial resource strain: Not on file  . Food insecurity:    Worry: Not on file    Inability: Not on file  . Transportation needs:    Medical: Not on file    Non-medical: Not on file  Tobacco Use  . Smoking status: Never Smoker  . Smokeless tobacco: Never Used  Substance and Sexual Activity  . Alcohol use: Yes    Alcohol/week: 18.0 standard drinks    Types: 6 Standard drinks or equivalent, 12 Cans of beer per week  . Drug use: No  . Sexual activity: Not on file  Lifestyle  . Physical activity:    Days per week: Not on file    Minutes per session: Not on file  . Stress: Not on file  Relationships  . Social connections:    Talks on phone: Not on file    Gets together: Not on file    Attends religious service: Not on file    Active member of club or organization: Not on file    Attends meetings of clubs or organizations: Not on file    Relationship status: Not on file  . Intimate partner violence:    Fear of current or ex partner: Not on file    Emotionally abused: Not on file    Physically abused: Not on file    Forced sexual activity: Not on file  Other Topics Concern  . Not on file  Social History Narrative   Fun: Swim, drink beer      EXAM: VITALS per patient if applicable: BP (!) 967/89   Pulse 64   Resp 12   Wt 258 lb (117 kg)   BMI 34.99 kg/m    GENERAL: alert, oriented, appears well and in no acute distress  HEENT: atraumatic,  conjunctiva clear, no obvious abnormalities on inspection of external nose and ears  NECK: normal movements of the head and neck  LUNGS: on inspection no signs of respiratory distress, breathing rate appears normal, no obvious gross SOB, gasping or wheezing  CV: no obvious cyanosis  MS: moves all visible extremities without noticeable abnormality. Not antalgic gait.  PSYCH/NEURO: pleasant and cooperative, no obvious depression or anxiety, speech and thought processing grossly intact   ASSESSMENT AND PLAN:  Discussed the following assessment and plan:  Orders Placed This Encounter  Procedures  . Comprehensive metabolic panel  . Lipid panel  . Hemoglobin A1c  .  T3, free  . T4, free  . TSH    Hyperlipidemia He has been not compliant with treatment. For now I am not making recommendations in regard to pharmacologic treatment. He is coming next week for fasting labs, further recommendation will be given according to lipid panel results.  Hypertension, essential, benign This seems to be a new problem. Recommend amlodipine 2.5 mg daily, he can increase dose to 5 mg if BP still 140/90 or above. Low-salt diet recommended. Continue monitoring BP at home. Labs will be done next week.  Low back pain Ibuprofen 400 mg 3 times daily with food as needed. Methocarbamol 500 mg 3 times daily as needed. We discussed side effects of medications. IcyHot patch may also help. He prefers to hold on orthopedic evaluation.  Allergic rhinitis Problem seems to be well controlled. Continue Nasonex nasal spray daily as needed. Nasal irrigation with saline as needed.  Asthma in adult, mild intermittent, uncomplicated Well-controlled. No changes in albuterol inhaler.  Prediabetes Recommend healthy lifestyle for primary prevention. Further recommendation will be given according to A1c.  Abnormal TSH Mild. We will plan on checking thyroid panel with next labs and recommendation will be  given accordingly.   I discussed the assessment and treatment plan with the patient. The patient was provided an opportunity to ask questions and all were answered. The patient agreed with the plan and demonstrated an understanding of the instructions.   The patient was advised to call back or seek an in-person evaluation if the symptoms worsen or if the condition fails to improve as anticipated.  Return in about 4 months (around 09/04/2018) for HTN,HLD.  I provided 27 minutes of non-face-to-face time during this encounter.   Kerrion Kemppainen [place], MD

## 2018-05-05 NOTE — Assessment & Plan Note (Signed)
Well-controlled. No changes in albuterol inhaler.

## 2018-05-05 NOTE — Assessment & Plan Note (Signed)
Problem seems to be well controlled. Continue Nasonex nasal spray daily as needed. Nasal irrigation with saline as needed.

## 2018-05-05 NOTE — Assessment & Plan Note (Signed)
He has been not compliant with treatment. For now I am not making recommendations in regard to pharmacologic treatment. He is coming next week for fasting labs, further recommendation will be given according to lipid panel results.

## 2018-05-05 NOTE — Assessment & Plan Note (Signed)
This seems to be a new problem. Recommend amlodipine 2.5 mg daily, he can increase dose to 5 mg if BP still 140/90 or above. Low-salt diet recommended. Continue monitoring BP at home. Labs will be done next week.

## 2018-05-05 NOTE — Assessment & Plan Note (Signed)
Ibuprofen 400 mg 3 times daily with food as needed. Methocarbamol 500 mg 3 times daily as needed. We discussed side effects of medications. IcyHot patch may also help. He prefers to hold on orthopedic evaluation.

## 2018-05-12 ENCOUNTER — Other Ambulatory Visit: Payer: Self-pay

## 2018-05-12 ENCOUNTER — Other Ambulatory Visit (INDEPENDENT_AMBULATORY_CARE_PROVIDER_SITE_OTHER): Payer: 59

## 2018-05-12 DIAGNOSIS — I10 Essential (primary) hypertension: Secondary | ICD-10-CM | POA: Diagnosis not present

## 2018-05-12 DIAGNOSIS — R7989 Other specified abnormal findings of blood chemistry: Secondary | ICD-10-CM | POA: Diagnosis not present

## 2018-05-12 DIAGNOSIS — E782 Mixed hyperlipidemia: Secondary | ICD-10-CM

## 2018-05-12 DIAGNOSIS — R7303 Prediabetes: Secondary | ICD-10-CM

## 2018-05-12 LAB — TSH: TSH: 3.59 u[IU]/mL (ref 0.35–4.50)

## 2018-05-12 LAB — COMPREHENSIVE METABOLIC PANEL
ALT: 32 U/L (ref 0–53)
AST: 23 U/L (ref 0–37)
Albumin: 4.2 g/dL (ref 3.5–5.2)
Alkaline Phosphatase: 84 U/L (ref 39–117)
BUN: 16 mg/dL (ref 6–23)
CO2: 27 mEq/L (ref 19–32)
Calcium: 9.3 mg/dL (ref 8.4–10.5)
Chloride: 104 mEq/L (ref 96–112)
Creatinine, Ser: 1.04 mg/dL (ref 0.40–1.50)
GFR: 91.24 mL/min (ref >60.00)
Glucose, Bld: 87 mg/dL (ref 70–99)
Potassium: 4.7 mEq/L (ref 3.5–5.1)
Sodium: 139 mEq/L (ref 135–145)
Total Bilirubin: 0.2 mg/dL (ref 0.2–1.2)
Total Protein: 7.2 g/dL (ref 6.0–8.3)

## 2018-05-12 LAB — LIPID PANEL
Cholesterol: 281 mg/dL — ABNORMAL HIGH (ref 0–200)
HDL: 45 mg/dL (ref >39.00)
NonHDL: 236.39
Total CHOL/HDL Ratio: 6
Triglycerides: 338 mg/dL — ABNORMAL HIGH (ref 0.0–149.0)
VLDL: 67.6 mg/dL — ABNORMAL HIGH (ref 0.0–40.0)

## 2018-05-12 LAB — HEMOGLOBIN A1C: Hgb A1c MFr Bld: 6.2 % (ref 4.6–6.5)

## 2018-05-12 LAB — LDL CHOLESTEROL, DIRECT: Direct LDL: 184 mg/dL

## 2018-05-12 LAB — T3, FREE: T3, Free: 3.2 pg/mL (ref 2.3–4.2)

## 2018-05-12 LAB — T4, FREE: Free T4: 0.72 ng/dL (ref 0.60–1.60)

## 2018-05-13 ENCOUNTER — Encounter: Payer: Self-pay | Admitting: Family Medicine

## 2018-05-18 ENCOUNTER — Encounter: Payer: Self-pay | Admitting: Family Medicine

## 2018-05-25 ENCOUNTER — Ambulatory Visit: Payer: Self-pay

## 2018-05-25 ENCOUNTER — Encounter: Payer: Self-pay | Admitting: Family Medicine

## 2018-05-25 ENCOUNTER — Other Ambulatory Visit: Payer: Self-pay | Admitting: *Deleted

## 2018-05-25 MED ORDER — ROSUVASTATIN CALCIUM 20 MG PO TABS: 20.0000 mg | ORAL_TABLET | Freq: Every day | ORAL | 3 refills | Status: DC

## 2018-05-25 NOTE — Telephone Encounter (Signed)
Incoming call from Patients wife requesting lab results.  Wife stating that she saw them on my chart.  Just wanted confirmation.  Provided lab results per Dr.  [place]ique of 05/12/18 Wife voices understanding.  States that she follows mychart faithfully.  Voices concern of Patients blood pressures  Range from 137/92 to 152/107 since March 24. Wonders if adjustment need to be with medication?

## 2018-07-27 ENCOUNTER — Ambulatory Visit (INDEPENDENT_AMBULATORY_CARE_PROVIDER_SITE_OTHER): Payer: 59 | Admitting: Family Medicine

## 2018-07-27 ENCOUNTER — Encounter: Payer: Self-pay | Admitting: Family Medicine

## 2018-07-27 ENCOUNTER — Other Ambulatory Visit: Payer: Self-pay

## 2018-07-27 DIAGNOSIS — Z20828 Contact with and (suspected) exposure to other viral communicable diseases: Secondary | ICD-10-CM

## 2018-07-27 DIAGNOSIS — Z20822 Contact with and (suspected) exposure to covid-19: Secondary | ICD-10-CM

## 2018-07-27 NOTE — Progress Notes (Signed)
Virtual Visit via Video Note   I connected with Rick Harris on 07/29/18 at  4:15 PM EDT by a video enabled telemedicine application and verified that I am speaking with the correct person using two identifiers.  Location patient: home Location provider:work office Persons participating in the virtual visit: patient, provider  I discussed the limitations of evaluation and management by telemedicine and the availability of in person appointments. The patient expressed understanding and agreed to proceed.   HPI: Rick Harris is a 53 yo male with Hx of asthma and OSA  Who is concerned about COVID 19 exposure,  "secondary contact." According to his wife, her mother was in the ER yesterday, she later learned that the person that was sitting beside her was diagnosed with COVID-19.  She was recommended to be tested "as soon as possible", COVID-19 testing was done yesterday, results are pending. Rick. [name] is concerned and also requesting COVID-19 testing. He denies any respiratory symptoms. Negative for anosmia and ageusia.   Denies fever,chills,fatigie,body aches,GI or urinary symptoms.  ROS: See pertinent positives and negatives per HPI.  Past Medical History:  Diagnosis Date  . Asthma   . Frequent headaches   . Hypercholesteremia   . OSA (obstructive sleep apnea)    mild-mod (11/2013), declined CPAP to work on weight reduction    Past Surgical History:  Procedure Laterality Date  . APPENDECTOMY  2009  . NASAL SINUS SURGERY  04/1997    Family History  Problem Relation Age of Onset  . Alcohol abuse Mother   . Hyperlipidemia Father        not biologic  . Stroke Father        not biologic  . Hypertension Father        not biologic  . Diabetes Father        not biologic  . Lung cancer Maternal Uncle   . Heart disease Maternal Grandmother   . Glaucoma Maternal Grandmother 63  . Lung disease Maternal Grandfather     Social History   Socioeconomic History  . Marital status:  Married    Spouse name: Not on file  . Number of children: 4  . Years of education: 67  . Highest education level: Not on file  Occupational History  . Occupation: Art therapist  Social Needs  . Financial resource strain: Not on file  . Food insecurity    Worry: Not on file    Inability: Not on file  . Transportation needs    Medical: Not on file    Non-medical: Not on file  Tobacco Use  . Smoking status: Never Smoker  . Smokeless tobacco: Never Used  Substance and Sexual Activity  . Alcohol use: Yes    Alcohol/week: 18.0 standard drinks    Types: 6 Standard drinks or equivalent, 12 Cans of beer per week  . Drug use: No  . Sexual activity: Not on file  Lifestyle  . Physical activity    Days per week: Not on file    Minutes per session: Not on file  . Stress: Not on file  Relationships  . Social Herbalist on phone: Not on file    Gets together: Not on file    Attends religious service: Not on file    Active member of club or organization: Not on file    Attends meetings of clubs or organizations: Not on file    Relationship status: Not on file  . Intimate partner violence  Fear of current or ex partner: Not on file    Emotionally abused: Not on file    Physically abused: Not on file    Forced sexual activity: Not on file  Other Topics Concern  . Not on file  Social History Narrative   Fun: Swim, drink beer       Current Outpatient Medications:  .  albuterol (PROVENTIL HFA;VENTOLIN HFA) 108 (90 Base) MCG/ACT inhaler, Inhale 2 puffs into the lungs every 4 (four) hours as needed for wheezing or shortness of breath., Disp: 1 Inhaler, Rfl: 3 .  amLODipine (NORVASC) 5 MG tablet, Take 0.5 tablets (2.5 mg total) by mouth daily., Disp: 45 tablet, Rfl: 1 .  methocarbamol (ROBAXIN) 500 MG tablet, Take 1 tablet (500 mg total) by mouth every 8 (eight) hours as needed for muscle spasms., Disp: 45 tablet, Rfl: 1 .  mometasone (NASONEX) 50 MCG/ACT nasal spray, Place 2  sprays into the nose daily., Disp: 17 g, Rfl: 5 .  NONFORMULARY OR COMPOUNDED ITEM, [facility]  Achilles Tendonitis Cream- Diclofenac 3%, Baclofen 2%, Bupivacaine 1%, Doxepin 5%, Gabapentin 6%, Ibuprofen 3%, Pentoxifylline 3% Apply 1-2 grams to affected area 3-4 times daily Qty. 120 gm 3 refills (Patient not taking: Reported on 03/11/2017), Disp: 1 each, Rfl: 2 .  rosuvastatin (CRESTOR) 20 MG tablet, Take 1 tablet (20 mg total) by mouth daily., Disp: 90 tablet, Rfl: 3  EXAM:  VITALS per patient if applicable:N/A  GENERAL: alert, oriented, appears well and in no acute distress  HEENT: atraumatic, conjunctiva clear, no obvious abnormalities on inspection of external nose and ears  NECK: normal movements of the head and neck  LUNGS: on inspection no signs of respiratory distress, breathing rate appears normal, no obvious gross SOB, gasping or wheezing  CV: no obvious cyanosis  MS: moves all visible extremities without noticeable abnormality  PSYCH/NEURO: pleasant and cooperative, no obvious depression or anxiety, speech and thought processing grossly intact  ASSESSMENT AND PLAN:  Discussed the following assessment and plan:  Exposure to Covid-19 Virus - Plan: Educated about signs,symptoms,transmission,and treatment of disease.  He is completely asymptomatic and no direct contact,so I think we can hold on testing for now.  I recommend 14 days of quarantine and social distancing even at home.  Chest temp daily. Clearly instructed about warning signs.  15 min face to face OV. > 50% was dedicated to discussion of Dx, prognosis, treatment options, and some side effects of medications.    I discussed the assessment and treatment plan with the patient. He and his wife were provided an opportunity to ask questions and all were answered. The patient agreed with the plan and demonstrated an understanding of the instructions.     Return if symptoms worsen or fail to  improve.    Betty Martinique, MD

## 2018-11-21 ENCOUNTER — Other Ambulatory Visit: Payer: Self-pay | Admitting: Family Medicine

## 2018-11-21 DIAGNOSIS — I10 Essential (primary) hypertension: Secondary | ICD-10-CM

## 2018-12-26 ENCOUNTER — Other Ambulatory Visit: Payer: Self-pay | Admitting: Cardiology

## 2018-12-26 DIAGNOSIS — Z20822 Contact with and (suspected) exposure to covid-19: Secondary | ICD-10-CM

## 2018-12-27 LAB — NOVEL CORONAVIRUS, NAA: SARS-CoV-2, NAA: NOT DETECTED

## 2019-04-11 ENCOUNTER — Ambulatory Visit
Admission: EM | Admit: 2019-04-11 | Discharge: 2019-04-11 | Disposition: A | Discharge status: Home / Self Care | Payer: 59 | Attending: Physician Assistant | Admitting: Physician Assistant

## 2019-04-11 ENCOUNTER — Ambulatory Visit (INDEPENDENT_AMBULATORY_CARE_PROVIDER_SITE_OTHER)
Admission: RE | Admit: 2019-04-11 | Discharge: 2019-04-11 | Disposition: A | Discharge status: Home / Self Care | Payer: 59 | Source: Ambulatory Visit

## 2019-04-11 DIAGNOSIS — Z20822 Contact with and (suspected) exposure to covid-19: Secondary | ICD-10-CM

## 2019-04-11 DIAGNOSIS — R112 Nausea with vomiting, unspecified: Secondary | ICD-10-CM

## 2019-04-11 DIAGNOSIS — R509 Fever, unspecified: Secondary | ICD-10-CM

## 2019-04-11 MED ORDER — ONDANSETRON 4 MG PO TBDP: 4.0000 mg | ORAL_TABLET | Freq: Three times a day (TID) | ORAL | 0 refills | Status: DC | PRN

## 2019-04-11 NOTE — ED Triage Notes (Signed)
Pt had a virtual visit today, came in for covid testing only

## 2019-04-11 NOTE — ED Provider Notes (Signed)
Virtual Visit via Video Note:  Rick Harris  initiated request for Telemedicine visit with [facility] team. I connected with Rick Harris  on 04/11/2019 at 10:20 AM  for a synchronized telemedicine visit using a video enabled HIPPA compliant telemedicine application. I verified that I am speaking with Rick Harris  using two identifiers. Emeline Simpson C Ulys Favia, PA-C  was physically located in a [facility] Urgent care site and ADRIAL TY was located at a different location.   The limitations of evaluation and management by telemedicine as well as the availability of in-person appointments were discussed. Patient was informed that he  may incur a bill ( including co-pay) for this virtual visit encounter. Rick Harris  expressed understanding and gave verbal consent to proceed with virtual visit.     History of Present Illness:Rick Harris  is a 54 y.o. male presents for evaluation of fever nausea and vomiting.  Patient woke up with a fever up to 101.7 today.  He is also felt nauseous with episodes of vomiting.  Denies hematemesis.  Has had one episode of looser stools.  Reports upper abdominal pain.  Describes as cramping.  Notes that it is generalized across entire upper abdomen, denies localizing to right side.  Has had prior appendectomy.  Denies associated URI symptoms of cough congestion, sore throat.  Wife has felt he has been breathing slightly heavier than normal.  Denies any known close sick contacts or Covid exposure.    No Known Allergies   Past Medical History:  Diagnosis Date  . Asthma   . Frequent headaches   . Hypercholesteremia   . OSA (obstructive sleep apnea)    mild-mod (11/2013), declined CPAP to work on weight reduction     Social History   Tobacco Use  . Smoking status: Never Smoker  . Smokeless tobacco: Never Used  Substance Use Topics  . Alcohol use: Yes    Alcohol/week: 18.0 standard drinks    Types: 6 Standard drinks or equivalent, 12 Cans  of beer per week  . Drug use: No        Observations/Objective: Physical Exam  Constitutional: He is oriented to person, place, and time.  Lying in bed, appears tired, but no acute distress  HENT:  Head: Normocephalic and atraumatic.  Neck:  Full active range of motion of neck  Pulmonary/Chest: Effort normal. No respiratory distress.  Speaking in full sentences, no coughing during visit  Musculoskeletal:     Cervical back: Normal range of motion.  Neurological: He is alert and oriented to person, place, and time.  Speech clear, face symmetric     Assessment and Plan:    ICD-10-CM   1. Fever, unspecified  R50.9   2. Non-intractable vomiting with nausea, unspecified vomiting type  R11.2     Day 1 of symptoms, likely viral etiology, do not suspect abdominal emergency at this time based on patient's description of pain and location.  Advised close monitoring of fever and symptoms and to follow-up in person if developing increased abdominal pain.  Recommended Covid testing given fever although without URI symptoms currently.  Patient will plan to follow-up in urgent care for nurse visit for PCR testing.  Recommending symptomatic and supportive care at this time, Tylenol and ibuprofen for fever, Zofran for nausea/vomiting.  Rest and push fluids.  Discussed strict return precautions. Patient verbalized understanding and is agreeable with plan.    Follow Up Instructions:     I discussed  the assessment and treatment plan with the patient. The patient was provided an opportunity to ask questions and all were answered. The patient agreed with the plan and demonstrated an understanding of the instructions.   The patient was advised to call back or seek an in-person evaluation if the symptoms worsen or if the condition fails to improve as anticipated.      Janith Lima, PA-C  04/11/2019 10:20 AM         Debara Pickett C, PA-C 04/11/19 1021

## 2019-04-11 NOTE — Discharge Instructions (Addendum)
Please go to urgent care for COVID test Alternate tylenol and ibuprofen every 4 hours for fever control Zofran/ondansetron every 8 hours for nausea and vomiting Focus on fluids, avoid spicy and greasy   Follow up in person if not improving, developing persistent fevers, worsening pain, weakness, dizziness and lightheadedness

## 2019-04-13 LAB — NOVEL CORONAVIRUS, NAA: SARS-CoV-2, NAA: NOT DETECTED

## 2019-08-24 ENCOUNTER — Ambulatory Visit: Payer: 59 | Admitting: Family Medicine

## 2019-08-24 ENCOUNTER — Other Ambulatory Visit: Payer: Self-pay

## 2019-08-24 ENCOUNTER — Encounter: Payer: Self-pay | Admitting: Gastroenterology

## 2019-08-24 ENCOUNTER — Encounter: Payer: Self-pay | Admitting: Family Medicine

## 2019-08-24 VITALS — BP 122/80 | HR 80 | Resp 16 | Ht 72.0 in | Wt 255.0 lb

## 2019-08-24 DIAGNOSIS — W57XXXA Bitten or stung by nonvenomous insect and other nonvenomous arthropods, initial encounter: Secondary | ICD-10-CM

## 2019-08-24 DIAGNOSIS — E782 Mixed hyperlipidemia: Secondary | ICD-10-CM

## 2019-08-24 DIAGNOSIS — S30865A Insect bite (nonvenomous) of unspecified external genital organs, male, initial encounter: Secondary | ICD-10-CM | POA: Diagnosis not present

## 2019-08-24 DIAGNOSIS — Z1211 Encounter for screening for malignant neoplasm of colon: Secondary | ICD-10-CM

## 2019-08-24 DIAGNOSIS — R7989 Other specified abnormal findings of blood chemistry: Secondary | ICD-10-CM

## 2019-08-24 DIAGNOSIS — J4520 Mild intermittent asthma, uncomplicated: Secondary | ICD-10-CM

## 2019-08-24 DIAGNOSIS — I10 Essential (primary) hypertension: Secondary | ICD-10-CM

## 2019-08-24 DIAGNOSIS — M25572 Pain in left ankle and joints of left foot: Secondary | ICD-10-CM

## 2019-08-24 DIAGNOSIS — R7303 Prediabetes: Secondary | ICD-10-CM | POA: Diagnosis not present

## 2019-08-24 NOTE — Patient Instructions (Addendum)
A few things to remember from today's visit:   Insect bite of male external genital organ, unspecified organ, initial encounter  Mixed hyperlipidemia - Plan: Lipid panel, Uric acid  Prediabetes - Plan: Comprehensive metabolic panel, Hemoglobin A1c  Colon cancer screening - Plan: Ambulatory referral to Gastroenterology  Arthralgia of foot, left - Plan: Uric acid  Hypertension, essential, benign - Plan: TSH  Abnormal TSH - Plan: TSH  Area of bite is healing, you can apply a low dose hydrocortisone. Resume Crestor. Today we are checking fasting labs. For asthma, no changes. Wt loss will help with all chronic medical problems.   If you need refills please call your pharmacy. Do not use My Chart to request refills or for acute issues that need immediate attention.    Please be sure medication list is accurate. If a new problem present, please set up appointment sooner than planned today.

## 2019-08-24 NOTE — Progress Notes (Signed)
Chief Complaint  Patient presents with  . bump on private area   HPI: Rick Harris is a 54 y.o. male, who is here today with his wife with above complaint. Last week he noted a lesion on his penis, pruritic, nontender.  It is getting better. He thinks he was beaten by an insect, did not see any,?  Spider. He took Benadryl, which helped with edema. Negative for fever, chills, easy bruising, or enlarged lymph nodes.  Joint pain: Yesterday he started with sudden pain and edema left MTP great toe, very painful and better today.  No history of trauma or gout. He has not tried OTC medication. Pain is exacerbated by walking with shoe wear, Alleviated by rest.   Last follow up visit in 05/2018.  Prediabetes: He has not noted polydipsia, polyuria, polyphagia.  Lab Results  Component Value Date   HGBA1C 6.2 05/12/2018   Hyperlipidemia: He is not taking Crestor 20 mg. He has tried to improve his diet, he has not noted weight loss.  Lab Results  Component Value Date   CHOL 281 (H) 05/12/2018   HDL 45.00 05/12/2018   LDLCALC 174 (H) 07/29/2013   LDLDIRECT 184.0 05/12/2018   TRIG 338.0 (H) 05/12/2018   CHOLHDL 6 05/12/2018   Hypertension: Currently he is on amlodipine 5 mg 1/2 tablet daily.  Negative for severe/frequent headache, visual changes, chest pain, dyspnea, palpitation, claudication, focal weakness, or edema. + OSA, he is not wearing CPAP. According to wife, he was recommended either waiting a CPAP or weight loss.  Lab Results  Component Value Date   CREATININE 1.04 05/12/2018   BUN 16 05/12/2018   NA 139 05/12/2018   K 4.7 05/12/2018   CL 104 05/12/2018   CO2 27 05/12/2018   2 years ago TSH 4.95  Lab Results  Component Value Date   TSH 3.59 05/12/2018   Asthma: He is using albuterol inhaler at least once per week. He is now working outdoors. Symptoms exacerbated by seasonal changes.  Review of Systems  Constitutional: Negative for activity change,  appetite change, fatigue and fever.  HENT: Negative for mouth sores, nosebleeds and sore throat.   Respiratory: Negative for cough and wheezing.   Cardiovascular: Negative for chest pain, palpitations and leg swelling.  Gastrointestinal: Negative for abdominal pain, nausea and vomiting.  Endocrine: Negative for cold intolerance and heat intolerance.  Genitourinary: Negative for decreased urine volume, dysuria and hematuria.  Musculoskeletal: Negative for gait problem and myalgias.  Skin: Negative for pallor and wound.  Neurological: Negative for dizziness, syncope and weakness.  Psychiatric/Behavioral: Negative for confusion.  Rest see pertinent positives and negatives per HPI.  Current Outpatient Medications on File Prior to Visit  Medication Sig Dispense Refill  . albuterol (PROVENTIL HFA;VENTOLIN HFA) 108 (90 Base) MCG/ACT inhaler Inhale 2 puffs into the lungs every 4 (four) hours as needed for wheezing or shortness of breath. 1 Inhaler 3  . methocarbamol (ROBAXIN) 500 MG tablet Take 1 tablet (500 mg total) by mouth every 8 (eight) hours as needed for muscle spasms. 45 tablet 1  . mometasone (NASONEX) 50 MCG/ACT nasal spray Place 2 sprays into the nose daily. 17 g 5  . NONFORMULARY OR COMPOUNDED ITEM [facility]  Achilles Tendonitis Cream- Diclofenac 3%, Baclofen 2%, Bupivacaine 1%, Doxepin 5%, Gabapentin 6%, Ibuprofen 3%, Pentoxifylline 3% Apply 1-2 grams to affected area 3-4 times daily Qty. 120 gm 3 refills (Patient not taking: Reported on 03/11/2017) 1 each 2   No current facility-administered  medications on file prior to visit.     Past Medical History:  Diagnosis Date  . Asthma   . Frequent headaches   . Hypercholesteremia   . OSA (obstructive sleep apnea)    mild-mod (11/2013), declined CPAP to work on weight reduction   No Known Allergies  Social History   Socioeconomic History  . Marital status: Married    Spouse name: Not on file  . Number of children: 4    . Years of education: 4  . Highest education level: Not on file  Occupational History  . Occupation: Art therapist  Tobacco Use  . Smoking status: Never Smoker  . Smokeless tobacco: Never Used  Substance and Sexual Activity  . Alcohol use: Yes    Alcohol/week: 18.0 standard drinks    Types: 6 Standard drinks or equivalent, 12 Cans of beer per week  . Drug use: No  . Sexual activity: Not on file  Other Topics Concern  . Not on file  Social History Narrative   Fun: Swim, drink beer    Social Determinants of Health   Financial Resource Strain:   . Difficulty of Paying Living Expenses:   Food Insecurity:   . Worried About Charity fundraiser in the Last Year:   . Arboriculturist in the Last Year:   Transportation Needs:   . Film/video editor (Medical):   Marland Kitchen Lack of Transportation (Non-Medical):   Physical Activity:   . Days of Exercise per Week:   . Minutes of Exercise per Session:   Stress:   . Feeling of Stress :   Social Connections:   . Frequency of Communication with Friends and Family:   . Frequency of Social Gatherings with Friends and Family:   . Attends Religious Services:   . Active Member of Clubs or Organizations:   . Attends Archivist Meetings:   Marland Kitchen Marital Status:     Vitals:   08/24/19 0815  BP: 122/80  Pulse: 80  Resp: 16  SpO2: 96%   Body mass index is 34.58 kg/m.  Physical Exam Vitals and nursing note reviewed.  Constitutional:      General: He is not in acute distress.    Appearance: He is well-developed.  HENT:     Head: Normocephalic and atraumatic.     Mouth/Throat:     Mouth: Mucous membranes are moist.     Pharynx: Oropharynx is clear.  Eyes:     Conjunctiva/sclera: Conjunctivae normal.     Pupils: Pupils are equal, round, and reactive to light.  Cardiovascular:     Rate and Rhythm: Normal rate and regular rhythm.     Pulses:          Dorsalis pedis pulses are 2+ on the right side and 2+ on the left side.     Heart  sounds: No murmur heard.   Pulmonary:     Effort: Pulmonary effort is normal. No respiratory distress.     Breath sounds: Normal breath sounds.  Abdominal:     Palpations: Abdomen is soft. There is no hepatomegaly or mass.     Tenderness: There is no abdominal tenderness.  Genitourinary:    Penis: No erythema or discharge.      Epididymis:     Right: Normal.     Left: Normal.       Comments: On right side of penia with a 2 mm papular lesion on prepuce.It is not tender, no erythema.  Musculoskeletal:  Right lower leg: No edema.     Left lower leg: No edema.     Left foot: Decreased range of motion (mild). Normal capillary refill. Tenderness (Left MTP joint great toe.) present. No swelling or deformity.  Lymphadenopathy:     Cervical: No cervical adenopathy.  Skin:    General: Skin is warm.     Findings: No erythema or rash.  Neurological:     Mental Status: He is alert and oriented to person, place, and time.     Cranial Nerves: No cranial nerve deficit.     Gait: Gait normal.  Psychiatric:        Mood and Affect: Mood and affect normal.     Comments: Well groomed, good eye contact.    ASSESSMENT AND PLAN:  Rick Harris was seen today for bump on private area.  Diagnoses and all orders for this visit: Orders Placed This Encounter  Procedures  . Comprehensive metabolic panel  . Hemoglobin A1c  . Lipid panel  . Uric acid  . TSH  . Ambulatory referral to Gastroenterology   Lab Results  Component Value Date   CREATININE 1.00 08/24/2019   BUN 13 08/24/2019   NA 136 08/24/2019   K 4.3 08/24/2019   CL 103 08/24/2019   CO2 26 08/24/2019   Lab Results  Component Value Date   ALT 33 08/24/2019   AST 23 08/24/2019   ALKPHOS 84 05/12/2018   BILITOT 0.4 08/24/2019    Lab Results  Component Value Date   HGBA1C 5.8 (H) 08/24/2019   Lab Results  Component Value Date   TSH 5.27 (H) 08/24/2019   Lab Results  Component Value Date   LABURIC 7.0 08/24/2019   Lab  Results  Component Value Date   CHOL 276 (H) 08/24/2019   HDL 56 08/24/2019   LDLCALC 175 (H) 08/24/2019   LDLDIRECT 184.0 05/12/2018   TRIG 242 (H) 08/24/2019   CHOLHDL 4.9 08/24/2019    Insect bite of male external genital organ, unspecified organ, initial encounter I did not appreciate acute lesion on aea of concerned, it is healed. Monitor for new symptoms.  Colon cancer screening -     Ambulatory referral to Gastroenterology  Arthralgia of foot, left Hx suggest gout. Educated about Dx. Low purine diet for now. If episodes become recurrent Allopurinol can be considered.  Abnormal TSH -     TSH  Hypertension, essential, benign BP adequately controlled. Continue Amlodipine 2.5 mg daily. Low salt diet and monitoring BP at home.  Hyperlipidemia Recommend resuming Crestor 20 mg daily and low fat diet. Further recommendations according to FLP results.  Prediabetes We discussed the importance of following a healthy lifestyle for primary prevention. Further recommendation will be given according to A1c.  Asthma in adult, mild intermittent, uncomplicated For now continue albuterol inhaler 2 puffs every 4-6 hours as needed. Weight loss will help. Next visit if still using albuterol twice or more per week, will consider adding a ICS.   Return in about 4 months (around 12/25/2019) for HTN and HLD.   Seeley Southgate G. Martinique, MD  [facility]. [place] office.  A few things to remember from today's visit:   Insect bite of male external genital organ, unspecified organ, initial encounter  Mixed hyperlipidemia - Plan: Lipid panel, Uric acid  Prediabetes - Plan: Comprehensive metabolic panel, Hemoglobin A1c  Colon cancer screening - Plan: Ambulatory referral to Gastroenterology  Arthralgia of foot, left - Plan: Uric acid  Hypertension, essential, benign -  Plan: TSH  Abnormal TSH - Plan: TSH  Area of bite is healing, you can apply a low dose  hydrocortisone. Resume Crestor. Today we are checking fasting labs. For asthma, no changes. Wt loss will help with all chronic medical problems.   If you need refills please call your pharmacy. Do not use My Chart to request refills or for acute issues that need immediate attention.    Please be sure medication list is accurate. If a new problem present, please set up appointment sooner than planned today.

## 2019-08-24 NOTE — Assessment & Plan Note (Signed)
We discussed the importance of following a healthy lifestyle for primary prevention. Further recommendation will be given according to A1c.

## 2019-08-24 NOTE — Assessment & Plan Note (Signed)
BP adequately controlled. Continue Amlodipine 2.5 mg daily. Low salt diet and monitoring BP at home.

## 2019-08-24 NOTE — Assessment & Plan Note (Signed)
For now continue albuterol inhaler 2 puffs every 4-6 hours as needed. Weight loss will help. Next visit if still using albuterol twice or more per week, will consider adding a ICS.

## 2019-08-24 NOTE — Assessment & Plan Note (Signed)
Recommend resuming Crestor 20 mg daily and low fat diet. Further recommendations according to FLP results.

## 2019-08-25 ENCOUNTER — Other Ambulatory Visit: Payer: Self-pay | Admitting: Family Medicine

## 2019-08-25 DIAGNOSIS — I10 Essential (primary) hypertension: Secondary | ICD-10-CM

## 2019-08-25 LAB — COMPREHENSIVE METABOLIC PANEL
AG Ratio: 1.5 (calc) (ref 1.0–2.5)
ALT: 33 U/L (ref 9–46)
AST: 23 U/L (ref 10–35)
Albumin: 4.1 g/dL (ref 3.6–5.1)
Alkaline phosphatase (APISO): 74 U/L (ref 35–144)
BUN: 13 mg/dL (ref 7–25)
CO2: 26 mmol/L (ref 20–32)
Calcium: 9.1 mg/dL (ref 8.6–10.3)
Chloride: 103 mmol/L (ref 98–110)
Creat: 1 mg/dL (ref 0.70–1.33)
Globulin: 2.8 g/dL (calc) (ref 1.9–3.7)
Glucose, Bld: 90 mg/dL (ref 65–99)
Potassium: 4.3 mmol/L (ref 3.5–5.3)
Sodium: 136 mmol/L (ref 135–146)
Total Bilirubin: 0.4 mg/dL (ref 0.2–1.2)
Total Protein: 6.9 g/dL (ref 6.1–8.1)

## 2019-08-25 LAB — HEMOGLOBIN A1C
Hgb A1c MFr Bld: 5.8 % of total Hgb — ABNORMAL HIGH (ref <5.7)
Mean Plasma Glucose: 120 (calc)
eAG (mmol/L): 6.6 (calc)

## 2019-08-25 LAB — LIPID PANEL
Cholesterol: 276 mg/dL — ABNORMAL HIGH (ref <200)
HDL: 56 mg/dL (ref >=40)
LDL Cholesterol (Calc): 175 mg/dL (calc) — ABNORMAL HIGH
Non-HDL Cholesterol (Calc): 220 mg/dL (calc) — ABNORMAL HIGH (ref <130)
Total CHOL/HDL Ratio: 4.9 (calc) (ref <5.0)
Triglycerides: 242 mg/dL — ABNORMAL HIGH (ref <150)

## 2019-08-25 LAB — URIC ACID: Uric Acid, Serum: 7 mg/dL (ref 4.0–8.0)

## 2019-08-25 LAB — TSH: TSH: 5.27 mIU/L — ABNORMAL HIGH (ref 0.40–4.50)

## 2019-08-28 MED ORDER — ROSUVASTATIN CALCIUM 20 MG PO TABS: 20.0000 mg | ORAL_TABLET | Freq: Every day | ORAL | 3 refills | Status: DC

## 2019-10-12 ENCOUNTER — Encounter: Payer: Self-pay | Admitting: Gastroenterology

## 2019-10-12 ENCOUNTER — Ambulatory Visit (AMBULATORY_SURGERY_CENTER): Payer: Self-pay

## 2019-10-12 ENCOUNTER — Other Ambulatory Visit: Payer: Self-pay

## 2019-10-12 VITALS — Ht 72.0 in | Wt 255.0 lb

## 2019-10-12 DIAGNOSIS — Z1211 Encounter for screening for malignant neoplasm of colon: Secondary | ICD-10-CM

## 2019-10-12 MED ORDER — SUTAB 1479-225-188 MG PO TABS: 1.0000 | ORAL_TABLET | ORAL | 0 refills | Status: DC

## 2019-10-12 NOTE — Progress Notes (Signed)
No egg or soy allergy known to patient  No issues with past sedation with any surgeries or procedures No intubation problems in the past  No FH of Malignant Hyperthermia No diet pills per patient No home 02 use per patient  No blood thinners per patient  Pt denies issues with constipation  No A fib or A flutter  EMMI video via MyChart  COVID 19 guidelines implemented in PV today with Pt and RN  Coupon given to pt in PV today , Code to Pharmacy  COVID vaccines completed on 05/2019 per pt;  Due to the COVID-19 pandemic we are asking patients to follow these guidelines. Please only bring one care partner. Please be aware that your care partner may wait in the car in the parking lot or if they feel like they will be too hot to wait in the car, they may wait in the lobby on the 4th floor. All care partners are required to wear a mask the entire time (we do not have any that we can provide them), they need to practice social distancing, and we will do a Covid check for all patient's and care partners when you arrive. Also we will check their temperature and your temperature. If the care partner waits in their car they need to stay in the parking lot the entire time and we will call them on their cell phone when the patient is ready for discharge so they can bring the car to the front of the building. Also all patient's will need to wear a mask into building.  

## 2019-10-26 ENCOUNTER — Other Ambulatory Visit: Payer: Self-pay

## 2019-10-26 ENCOUNTER — Encounter: Payer: Self-pay | Admitting: Gastroenterology

## 2019-10-26 ENCOUNTER — Ambulatory Visit (AMBULATORY_SURGERY_CENTER): Payer: 59 | Admitting: Gastroenterology

## 2019-10-26 VITALS — BP 99/60 | HR 50 | Temp 96.6°F | Resp 14 | Ht 72.0 in | Wt 255.0 lb

## 2019-10-26 DIAGNOSIS — D120 Benign neoplasm of cecum: Secondary | ICD-10-CM

## 2019-10-26 DIAGNOSIS — Z1211 Encounter for screening for malignant neoplasm of colon: Secondary | ICD-10-CM | POA: Diagnosis not present

## 2019-10-26 DIAGNOSIS — D123 Benign neoplasm of transverse colon: Secondary | ICD-10-CM | POA: Diagnosis not present

## 2019-10-26 DIAGNOSIS — D122 Benign neoplasm of ascending colon: Secondary | ICD-10-CM

## 2019-10-26 MED ORDER — SODIUM CHLORIDE 0.9 % IV SOLN: 500.0000 mL | Freq: Once | INTRAVENOUS | Status: DC

## 2019-10-26 NOTE — Progress Notes (Signed)
Called to room to assist during endoscopic procedure.  Patient ID and intended procedure confirmed with present staff. Received instructions for my participation in the procedure from the performing physician.  

## 2019-10-26 NOTE — Progress Notes (Signed)
PT taken to PACU. Monitors in place. VSS. Report given to RN. 

## 2019-10-26 NOTE — Patient Instructions (Signed)
Please read handouts provided. Continue present medications. Await pathology results.   YOU HAD AN ENDOSCOPIC PROCEDURE TODAY AT THE [facility]:   Refer to the procedure report that was given to you for any specific questions about what was found during the examination.  If the procedure report does not answer your questions, please call your gastroenterologist to clarify.  If you requested that your care partner not be given the details of your procedure findings, then the procedure report has been included in a sealed envelope for you to review at your convenience later.  YOU SHOULD EXPECT: Some feelings of bloating in the abdomen. Passage of more gas than usual.  Walking can help get rid of the air that was put into your GI tract during the procedure and reduce the bloating. If you had a lower endoscopy (such as a colonoscopy or flexible sigmoidoscopy) you may notice spotting of blood in your stool or on the toilet paper. If you underwent a bowel prep for your procedure, you may not have a normal bowel movement for a few days.  Please Note:  You might notice some irritation and congestion in your nose or some drainage.  This is from the oxygen used during your procedure.  There is no need for concern and it should clear up in a day or so.  SYMPTOMS TO REPORT IMMEDIATELY:  Following lower endoscopy (colonoscopy or flexible sigmoidoscopy):  Excessive amounts of blood in the stool  Significant tenderness or worsening of abdominal pains  Swelling of the abdomen that is new, acute  Fever of 100F or higher   For urgent or emergent issues, a gastroenterologist can be reached at any hour by calling [phone]. Do not use MyChart messaging for urgent concerns.    DIET:  We do recommend a small meal at first, but then you may proceed to your regular diet.  Drink plenty of fluids but you should avoid alcoholic beverages for 24 hours.  ACTIVITY:  You should plan to take it easy  for the rest of today and you should NOT DRIVE or use heavy machinery until tomorrow (because of the sedation medicines used during the test).    FOLLOW UP: Our staff will call the number listed on your records 48-72 hours following your procedure to check on you and address any questions or concerns that you may have regarding the information given to you following your procedure. If we do not reach you, we will leave a message.  We will attempt to reach you two times.  During this call, we will ask if you have developed any symptoms of COVID 19. If you develop any symptoms (ie: fever, flu-like symptoms, shortness of breath, cough etc.) before then, please call [phone].  If you test positive for Covid 19 in the 2 weeks post procedure, please call and report this information to us.    If any biopsies were taken you will be contacted by phone or by letter within the next 1-3 weeks.  Please call us at [phone] if you have not heard about the biopsies in 3 weeks.    SIGNATURES/CONFIDENTIALITY: You and/or your care partner have signed paperwork which will be entered into your electronic medical record.  These signatures attest to the fact that that the information above on your After Visit Summary has been reviewed and is understood.  Full responsibility of the confidentiality of this discharge information lies with you and/or your care-partner.  

## 2019-10-26 NOTE — Op Note (Signed)
[facility] Patient Name: Rick Harris Procedure Date: 10/26/2019 9:01 AM MRN: [id number] Endoscopist: Remo Lipps P. Havery Moros , MD Age: 54 Referring MD:  Date of Birth: 01-26-1966 Gender: Male Account #: [id number] Procedure:                Colonoscopy Indications:              Screening for colorectal malignant neoplasm, This                            is the patient's first colonoscopy Medicines:                Monitored Anesthesia Care Procedure:                Pre-Anesthesia Assessment:                           - Prior to the procedure, a History and Physical                            was performed, and patient medications and                            allergies were reviewed. The patient's tolerance of                            previous anesthesia was also reviewed. The risks                            and benefits of the procedure and the sedation                            options and risks were discussed with the patient.                            All questions were answered, and informed consent                            was obtained. Prior Anticoagulants: The patient has                            taken no previous anticoagulant or antiplatelet                            agents. ASA Grade Assessment: II - A patient with                            mild systemic disease. After reviewing the risks                            and benefits, the patient was deemed in                            satisfactory condition to undergo the procedure.  After obtaining informed consent, the colonoscope                            was passed under direct vision. Throughout the                            procedure, the patient's blood pressure, pulse, and                            oxygen saturations were monitored continuously. The                            Colonoscope was introduced through the anus and                            advanced to the the  cecum, identified by                            appendiceal orifice and ileocecal valve. The                            colonoscopy was performed without difficulty. The                            patient tolerated the procedure well. The quality                            of the bowel preparation was good. The ileocecal                            valve, appendiceal orifice, and rectum were                            photographed. Scope In: 9:05:27 AM Scope Out: 9:28:49 AM Scope Withdrawal Time: 0 hours 17 minutes 44 seconds  Total Procedure Duration: 0 hours 23 minutes 22 seconds  Findings:                 The perianal and digital rectal examinations were                            normal.                           A 4 mm polyp was found in the cecum. The polyp was                            sessile. The polyp was removed with a cold snare.                            Resection and retrieval were complete.                           Two sessile polyps were found in the ascending  colon. The polyps were 3 to 4 mm in size. These                            polyps were removed with a cold snare. Resection                            and retrieval were complete.                           A 3 mm polyp was found in the hepatic flexure. The                            polyp was sessile. The polyp was removed with a                            cold snare. Resection and retrieval were complete.                           Two sessile polyps were found in the transverse                            colon. The polyps were 3 to 5 mm in size. These                            polyps were removed with a cold snare. Resection                            and retrieval were complete.                           The exam was otherwise without abnormality. Complications:            No immediate complications. Estimated blood loss:                            Minimal. Estimated Blood Loss:      Estimated blood loss was minimal. Impression:               - One 4 mm polyp in the cecum, removed with a cold                            snare. Resected and retrieved.                           - Two 3 to 4 mm polyps in the ascending colon,                            removed with a cold snare. Resected and retrieved.                           - One 3 mm polyp at the hepatic flexure, removed  with a cold snare. Resected and retrieved.                           - Two 3 to 5 mm polyps in the transverse colon,                            removed with a cold snare. Resected and retrieved.                           - The examination was otherwise normal. Recommendation:           - Patient has a contact number available for                            emergencies. The signs and symptoms of potential                            delayed complications were discussed with the                            patient. Return to normal activities tomorrow.                            Written discharge instructions were provided to the                            patient.                           - Resume previous diet.                           - Continue present medications.                           - Await pathology results. Remo Lipps P. Giovannie Scerbo, MD 10/26/2019 9:33:39 AM This report has been signed electronically.

## 2019-10-28 ENCOUNTER — Telehealth: Payer: Self-pay

## 2019-10-28 NOTE — Telephone Encounter (Signed)
  Follow up Call-  Call back number 10/26/2019  Post procedure Call Back phone  # [phone]  Permission to leave phone message Yes  Some recent data might be hidden     Patient questions:  Do you have a fever, pain , or abdominal swelling? No. Pain Score  0 *  Have you tolerated food without any problems? Yes.    Have you been able to return to your normal activities? Yes.    Do you have any questions about your discharge instructions: Diet   No. Medications  No. Follow up visit  No.  Do you have questions or concerns about your Care? No.  Actions: * If pain score is 4 or above: No action needed, pain <4.  1. Have you developed a fever since your procedure? no  2.   Have you had an respiratory symptoms (SOB or cough) since your procedure? no  3.   Have you tested positive for COVID 19 since your procedure no  4.   Have you had any family members/close contacts diagnosed with the COVID 19 since your procedure?  no   If yes to any of these questions please route to Joylene John, RN and Joella Prince, RN

## 2019-10-29 IMAGING — CR DG ANKLE COMPLETE 3+V*R*
3 series · 3 of 3 positions shown · non-contrast
Comparison: Radiographs 12/22/2016 and 11/24/2016.

CLINICAL DATA: Medial ankle pain.  Inversion injury.

EXAM:
RIGHT ANKLE - COMPLETE 3+ VIEW

[x ankle ap right]
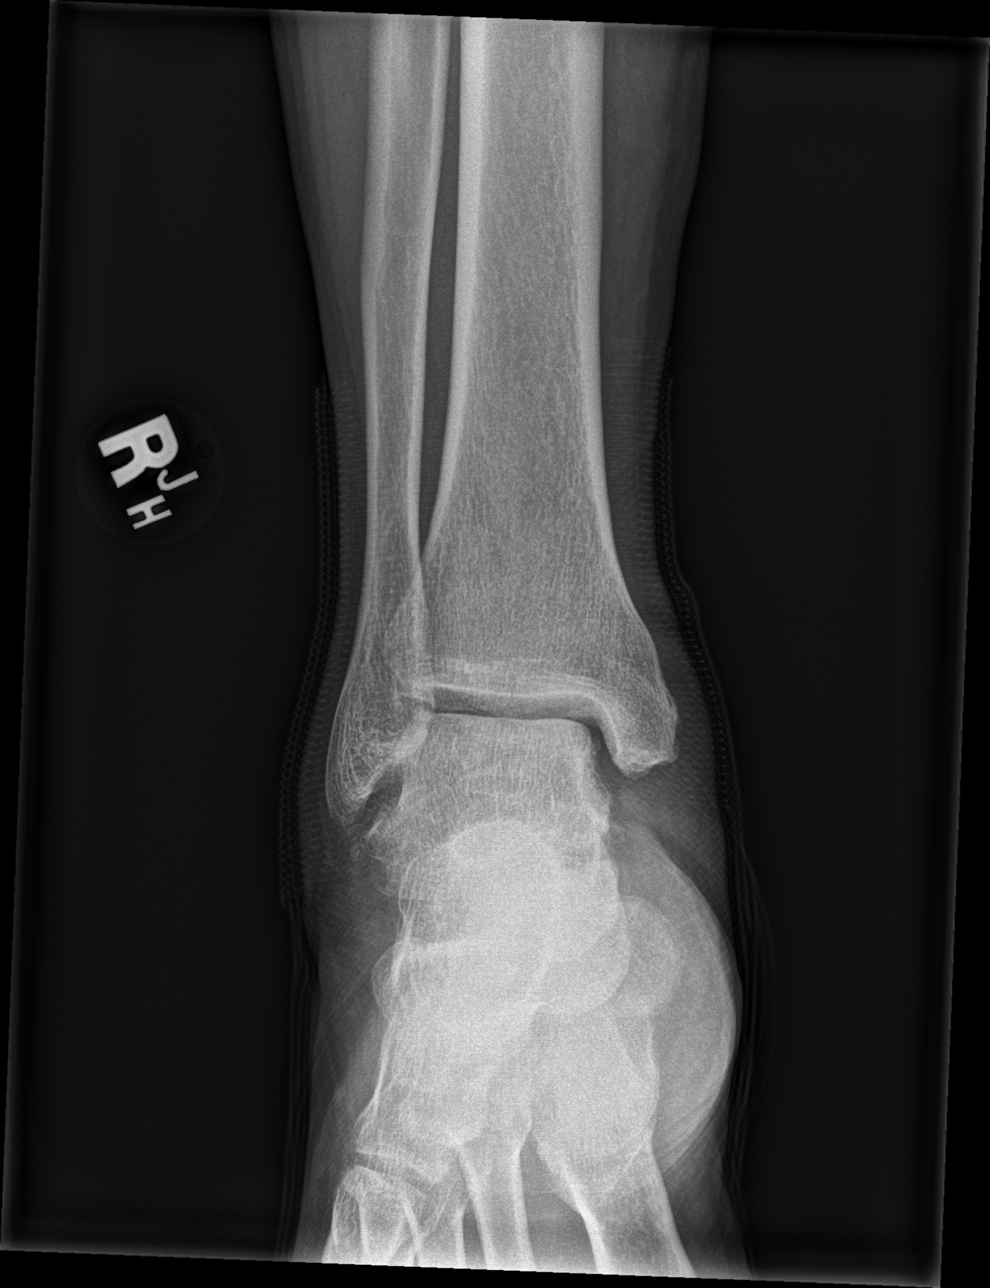

[x ankle obl right]
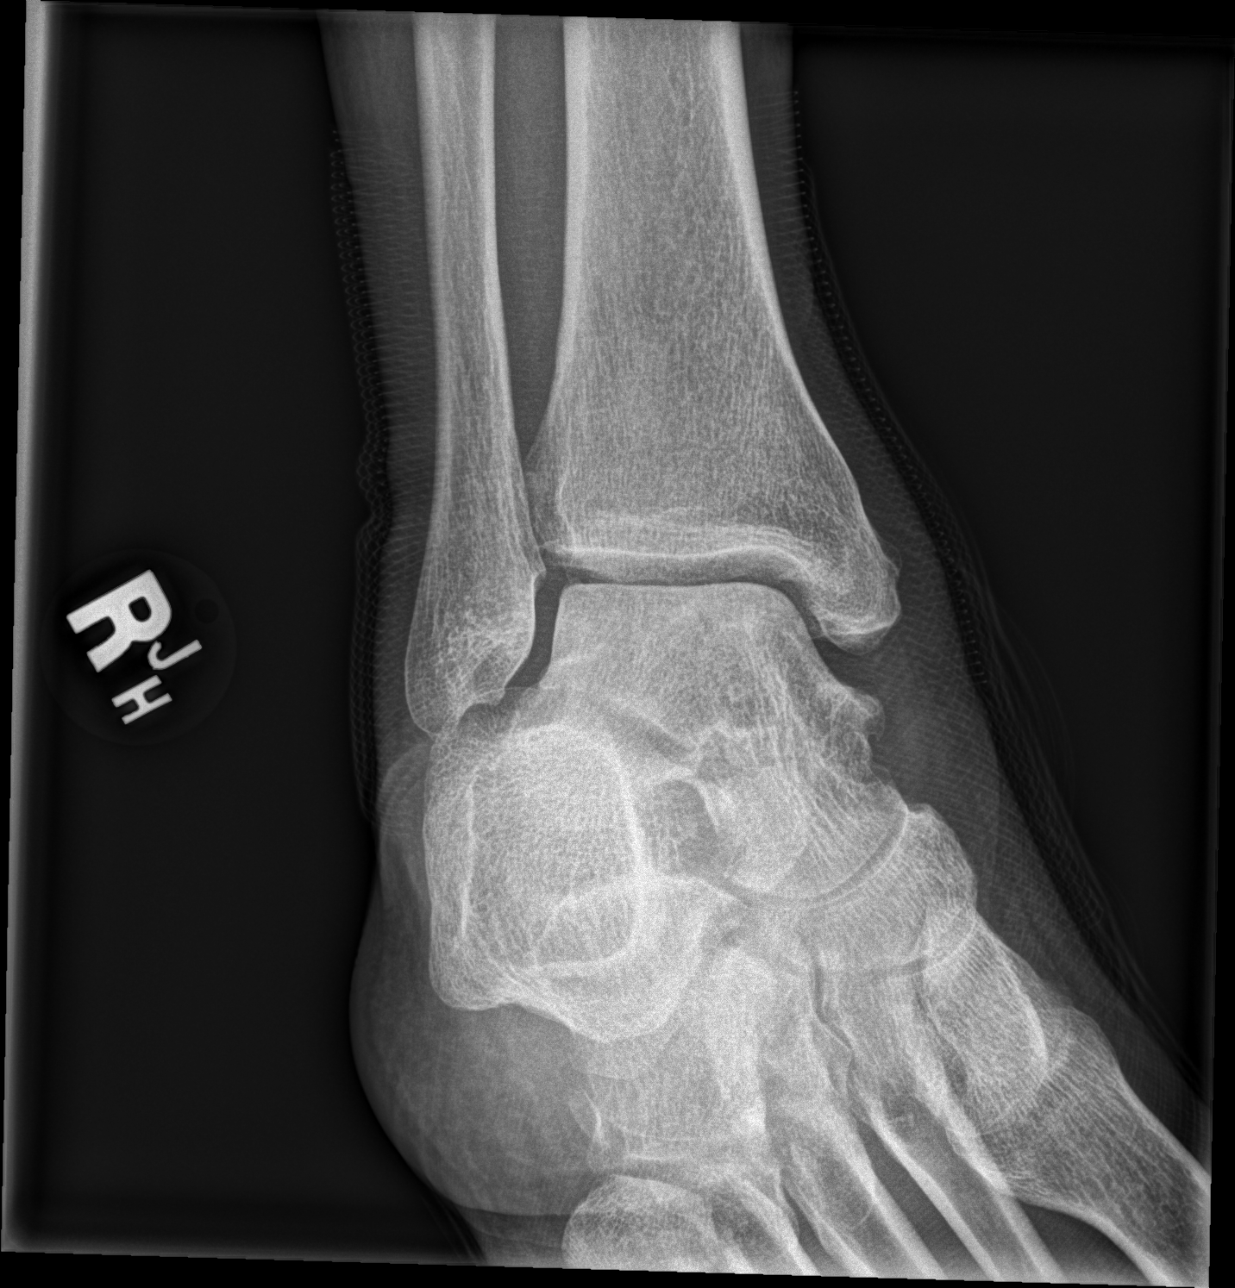

[x ankle lat right]
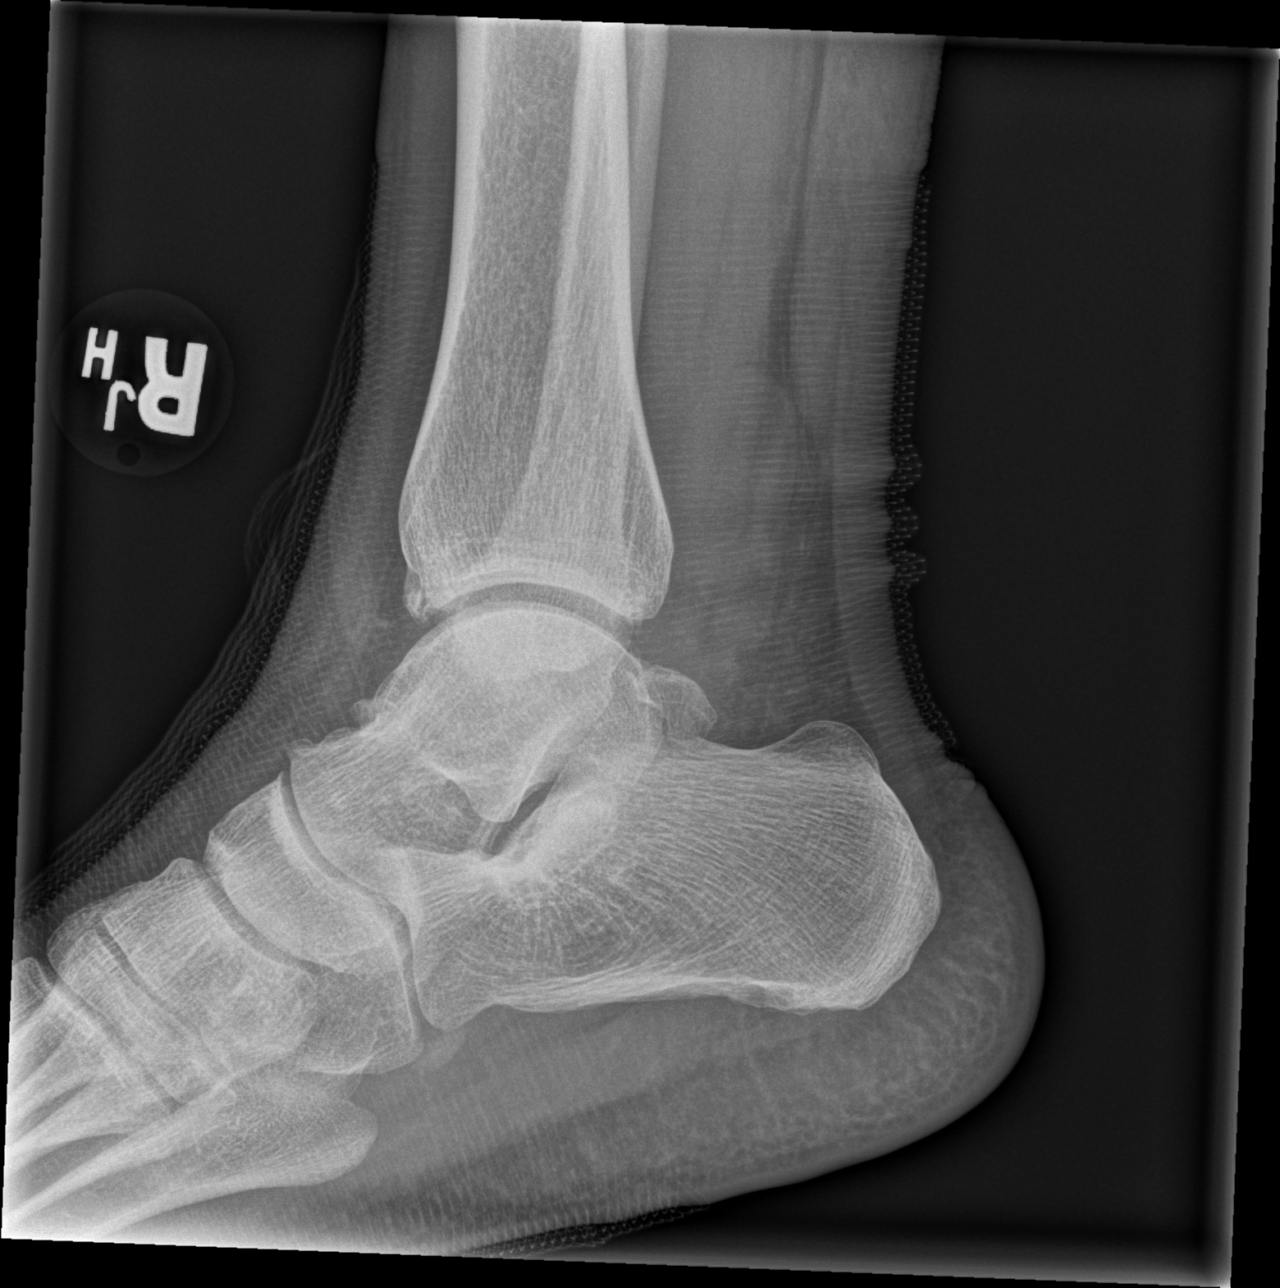

[3 of 3 positions shown; findings below may reference images not displayed]

FINDINGS: Image quality is mildly degraded by overlying splint or clothing.
The bones appear adequately mineralized. No evidence of acute
fracture or dislocation. There is mild spurring of the malleoli and
mild tibiotalar degenerative changes. No focal soft tissue swelling
identified.
IMPRESSION: No evidence of acute fracture or dislocation.

## 2020-05-08 ENCOUNTER — Other Ambulatory Visit: Payer: Self-pay | Admitting: Family Medicine

## 2020-05-08 DIAGNOSIS — I10 Essential (primary) hypertension: Secondary | ICD-10-CM

## 2020-08-15 ENCOUNTER — Other Ambulatory Visit: Payer: Self-pay | Admitting: Family Medicine

## 2020-08-15 DIAGNOSIS — I10 Essential (primary) hypertension: Secondary | ICD-10-CM

## 2020-10-18 ENCOUNTER — Other Ambulatory Visit: Payer: Self-pay | Admitting: Family Medicine

## 2020-10-18 DIAGNOSIS — I10 Essential (primary) hypertension: Secondary | ICD-10-CM

## 2020-11-11 ENCOUNTER — Other Ambulatory Visit: Payer: Self-pay | Admitting: Family Medicine

## 2020-11-11 DIAGNOSIS — I10 Essential (primary) hypertension: Secondary | ICD-10-CM

## 2020-12-21 ENCOUNTER — Encounter: Payer: Self-pay | Admitting: Family Medicine

## 2020-12-21 ENCOUNTER — Ambulatory Visit (INDEPENDENT_AMBULATORY_CARE_PROVIDER_SITE_OTHER): Payer: Managed Care, Other (non HMO) | Admitting: Family Medicine

## 2020-12-21 VITALS — BP 126/82 | HR 67 | Temp 98.4°F | Resp 16 | Ht 72.0 in | Wt 257.0 lb

## 2020-12-21 DIAGNOSIS — Z1159 Encounter for screening for other viral diseases: Secondary | ICD-10-CM

## 2020-12-21 DIAGNOSIS — Z130 Encounter for screening for diseases of the blood and blood-forming organs and certain disorders involving the immune mechanism: Secondary | ICD-10-CM

## 2020-12-21 DIAGNOSIS — Z0000 Encounter for general adult medical examination without abnormal findings: Secondary | ICD-10-CM

## 2020-12-21 DIAGNOSIS — E782 Mixed hyperlipidemia: Secondary | ICD-10-CM

## 2020-12-21 DIAGNOSIS — Z13228 Encounter for screening for other metabolic disorders: Secondary | ICD-10-CM | POA: Diagnosis not present

## 2020-12-21 DIAGNOSIS — I10 Essential (primary) hypertension: Secondary | ICD-10-CM

## 2020-12-21 DIAGNOSIS — Z1329 Encounter for screening for other suspected endocrine disorder: Secondary | ICD-10-CM

## 2020-12-21 DIAGNOSIS — Z125 Encounter for screening for malignant neoplasm of prostate: Secondary | ICD-10-CM | POA: Diagnosis not present

## 2020-12-21 DIAGNOSIS — H6192 Disorder of left external ear, unspecified: Secondary | ICD-10-CM

## 2020-12-21 DIAGNOSIS — R200 Anesthesia of skin: Secondary | ICD-10-CM

## 2020-12-21 LAB — CBC
HCT: 40.7 % (ref 39.0–52.0)
Hemoglobin: 12.7 g/dL — ABNORMAL LOW (ref 13.0–17.0)
MCHC: 31.2 g/dL (ref 30.0–36.0)
MCV: 75 fl — ABNORMAL LOW (ref 78.0–100.0)
Platelets: 209 10*3/uL (ref 150.0–400.0)
RBC: 5.42 Mil/uL (ref 4.22–5.81)
RDW: 14.4 % (ref 11.5–15.5)
WBC: 6.4 10*3/uL (ref 4.0–10.5)

## 2020-12-21 LAB — COMPREHENSIVE METABOLIC PANEL
ALT: 53 U/L (ref 0–53)
AST: 46 U/L — ABNORMAL HIGH (ref 0–37)
Albumin: 4.4 g/dL (ref 3.5–5.2)
Alkaline Phosphatase: 70 U/L (ref 39–117)
BUN: 18 mg/dL (ref 6–23)
CO2: 27 mEq/L (ref 19–32)
Calcium: 9.2 mg/dL (ref 8.4–10.5)
Chloride: 102 mEq/L (ref 96–112)
Creatinine, Ser: 1.28 mg/dL (ref 0.40–1.50)
GFR: 64.02 mL/min (ref >60.00)
Glucose, Bld: 79 mg/dL (ref 70–99)
Potassium: 4.1 mEq/L (ref 3.5–5.1)
Sodium: 137 mEq/L (ref 135–145)
Total Bilirubin: 0.3 mg/dL (ref 0.2–1.2)
Total Protein: 7.6 g/dL (ref 6.0–8.3)

## 2020-12-21 LAB — LIPID PANEL
Cholesterol: 285 mg/dL — ABNORMAL HIGH (ref 0–200)
HDL: 57.6 mg/dL (ref >39.00)
Total CHOL/HDL Ratio: 5
Triglycerides: 489 mg/dL — ABNORMAL HIGH (ref 0.0–149.0)

## 2020-12-21 LAB — HEMOGLOBIN A1C: Hgb A1c MFr Bld: 6.4 % (ref 4.6–6.5)

## 2020-12-21 LAB — LDL CHOLESTEROL, DIRECT: Direct LDL: 186 mg/dL

## 2020-12-21 LAB — PSA: PSA: 0.43 ng/mL (ref 0.10–4.00)

## 2020-12-21 LAB — VITAMIN B12: Vitamin B-12: 370 pg/mL (ref 211–911)

## 2020-12-21 MED ORDER — CIPROFLOXACIN-DEXAMETHASONE 0.3-0.1 % OT SUSP: 4.0000 [drp] | Freq: Two times a day (BID) | OTIC | 0 refills | Status: AC

## 2020-12-21 MED ORDER — AMLODIPINE BESYLATE 2.5 MG PO TABS: 2.5000 mg | ORAL_TABLET | Freq: Every day | ORAL | 2 refills | Status: DC

## 2020-12-21 NOTE — Progress Notes (Signed)
HPI: [name] is a 55 y.o.male here today for his routine physical examination.  Last CPE: 12/28/16 He lives with wife and son.  Regular exercise 3 or more times per week: He is not. Following a healthful diet: Decreased rice intake changes for cauliflower rice. He loves bread, he eats 2 sandwich for lunch. Sleeps about 4 hours.  Chronic medical problems: Hyperlipidemia, asthma,prediabetes,OSA not wearing CPAP,and back pain among some.  Immunization History  Administered Date(s) Administered   PFIZER Comirnaty(Gray Top)Covid-19 Tri-Sucrose Vaccine 05/12/2019, 06/07/2019   Td 02/11/2008   Tdap 09/04/2014, 12/17/2020   Health Maintenance  Topic Date Due   Hepatitis C Screening  Never done   COVID-19 Vaccine (3 - Booster for [place] series) 01/06/2021 (Originally 08/02/2019)   Zoster Vaccines- Shingrix (1 of 2) 03/23/2021 (Originally 09/25/2017)   INFLUENZA VACCINE  05/10/2021 (Originally 09/10/2020)   Pneumococcal Vaccine 70-60 Years old (1 - PCV) 12/21/2021 (Originally 09/25/1973)   COLONOSCOPY (Pts 45-88yrs Insurance coverage will need to be confirmed)  10/26/2022   TETANUS/TDAP  12/18/2030   HIV Screening  Completed   HPV VACCINES  Aged Out   Nocturia x 1. He drinks a battle of water at bedtime.  He drinks beer about 3 times per week 9 at the time. No every day smoker, he smokes when he visits his brother , once per week in average and 3 cig at the time.  -Concerns and/or follow up today:  HTN: Taking Amlodipine 2.5 mg daily. He is not checking BP.  Lab Results  Component Value Date   CREATININE 1.00 08/24/2019   BUN 13 08/24/2019   NA 136 08/24/2019   K 4.3 08/24/2019   CL 103 08/24/2019   CO2 26 08/24/2019   2-3rd toe feel numb , noticeable when walking, stable for a year. No associated weakness, rash,or cyanosis. Denies lower back pain. Upper back pain a few weeks ago, lasted about a month.  2017 right ankle fx with residual numbness on lateral  malleolus.  Prediabetes: Last HgA1C 5.8 in 08/2019, 6.2 2 years ago. Negative for polydipsia,polyuria, or polyphagia.  HLD: He is not taking Crestor. Component     Latest Ref Rng & Units 08/24/2019  Cholesterol     0 - 200 mg/dL 276 (H)  HDL Cholesterol     >39.00 mg/dL 56  Triglycerides     0.0 - 149.0 mg/dL 242 (H)  LDL Cholesterol (Calc)     mg/dL (calc) 175 (H)  Total CHOL/HDL Ratio      4.9  Non-HDL Cholesterol (Calc)     <130 mg/dL (calc) 220 (H)   Review of Systems  Constitutional:  Negative for activity change, appetite change, fatigue and fever.  HENT:  Negative for mouth sores, nosebleeds and sore throat.   Eyes:  Negative for redness and visual disturbance.  Respiratory:  Negative for cough, shortness of breath and wheezing.   Cardiovascular:  Negative for chest pain, palpitations and leg swelling.  Gastrointestinal:  Negative for abdominal pain, blood in stool, nausea and vomiting.  Endocrine: Negative for cold intolerance and heat intolerance.  Genitourinary:  Negative for decreased urine volume, dysuria, genital sores, hematuria and testicular pain.  Musculoskeletal:  Negative for arthralgias, back pain, joint swelling and myalgias.  Skin:  Negative for color change and rash.  Allergic/Immunologic: Positive for environmental allergies.  Neurological:  Negative for syncope, weakness and headaches.  Hematological:  Negative for adenopathy. Does not bruise/bleed easily.  Psychiatric/Behavioral:  Negative for confusion and sleep disturbance.  The patient is not nervous/anxious.   All other systems reviewed and are negative.  Current Outpatient Medications on File Prior to Visit  Medication Sig Dispense Refill   albuterol (PROVENTIL HFA;VENTOLIN HFA) 108 (90 Base) MCG/ACT inhaler Inhale 2 puffs into the lungs every 4 (four) hours as needed for wheezing or shortness of breath. 1 Inhaler 3   cephALEXin (KEFLEX) 500 MG capsule Take 500 mg by mouth 3 (three) times daily.      methocarbamol (ROBAXIN) 500 MG tablet Take 1 tablet (500 mg total) by mouth every 8 (eight) hours as needed for muscle spasms. 45 tablet 1   mometasone (NASONEX) 50 MCG/ACT nasal spray Place 2 sprays into the nose daily. 17 g 5   No current facility-administered medications on file prior to visit.   Past Medical History:  Diagnosis Date   Allergy    seasonal allergies   Asthma    uses inhaler when needed   Frequent headaches    Heart murmur    childhood   Hypercholesteremia    on meds   Hypertension    on meds   OSA (obstructive sleep apnea)    mild-mod (11/2013), declined CPAP to work on weight reduction   Sleep apnea    not have a CPAP machine   Past Surgical History:  Procedure Laterality Date   APPENDECTOMY  2009   NASAL SINUS SURGERY  04/1997   WISDOM TOOTH EXTRACTION  2018   No Known Allergies  Family History  Problem Relation Age of Onset   Alcohol abuse Mother    Hyperlipidemia Father        not biologic   Stroke Father        not biologic   Hypertension Father        not biologic   Diabetes Father        not biologic   Lung cancer Maternal Uncle    Heart disease Maternal Grandmother    Glaucoma Maternal Grandmother 63   Lung disease Maternal Grandfather    Colon polyps Neg Hx    Colon cancer Neg Hx    Esophageal cancer Neg Hx    Stomach cancer Neg Hx    Rectal cancer Neg Hx     Social History   Socioeconomic History   Marital status: Married    Spouse name: Not on file   Number of children: 4   Years of education: 14   Highest education level: Not on file  Occupational History   Occupation: Art therapist  Tobacco Use   Smoking status: Some Days    Packs/day: 0.25    Types: Cigarettes   Smokeless tobacco: Never  Vaping Use   Vaping Use: Never used  Substance and Sexual Activity   Alcohol use: Yes    Alcohol/week: 36.0 standard drinks    Types: 6 Standard drinks or equivalent, 30 Cans of beer per week   Drug use: No   Sexual activity:  Not on file  Other Topics Concern   Not on file  Social History Narrative   Fun: Swim, drink beer    Social Determinants of Health   Financial Resource Strain: Not on file  Food Insecurity: Not on file  Transportation Needs: Not on file  Physical Activity: Not on file  Stress: Not on file  Social Connections: Not on file   Vitals:   12/21/20 1402  BP: 126/82  Pulse: 67  Resp: 16  Temp: 98.4 F (36.9 C)  SpO2: 98%   Body  mass index is 34.86 kg/m.  Wt Readings from Last 3 Encounters:  12/21/20 257 lb (116.6 kg)  10/26/19 255 lb (115.7 kg)  10/12/19 255 lb (115.7 kg)   Physical Exam Vitals and nursing note reviewed.  Constitutional:      General: He is not in acute distress.    Appearance: He is well-developed.  HENT:     Head: Normocephalic and atraumatic.     Right Ear: Tympanic membrane, ear canal and external ear normal.     Left Ear: Tympanic membrane and external ear normal.     Ears:     Comments: Left ear canal a whitish,pearl like papular lesion.     Mouth/Throat:     Mouth: Mucous membranes are moist.     Pharynx: Oropharynx is clear.  Eyes:     Extraocular Movements:     Right eye: Abnormal extraocular motion (Right exophoria.) present.     Left eye: Normal extraocular motion.     Conjunctiva/sclera: Conjunctivae normal.     Pupils: Pupils are equal, round, and reactive to light.  Neck:     Thyroid: No thyromegaly.     Trachea: No tracheal deviation.  Cardiovascular:     Rate and Rhythm: Normal rate and regular rhythm.     Pulses:          Dorsalis pedis pulses are 2+ on the right side and 2+ on the left side.     Heart sounds: No murmur heard. Pulmonary:     Effort: Pulmonary effort is normal. No respiratory distress.     Breath sounds: Normal breath sounds.  Abdominal:     Palpations: Abdomen is soft. There is no hepatomegaly or mass.     Tenderness: There is no abdominal tenderness.  Genitourinary:    Comments: No  concerns. Musculoskeletal:        General: No tenderness.     Cervical back: Normal range of motion.     Left foot: Normal range of motion and normal capillary refill. No deformity.       Feet:     Comments: No major deformities appreciated and no signs of synovitis.  Lymphadenopathy:     Cervical: No cervical adenopathy.     Upper Body:     Right upper body: No supraclavicular adenopathy.     Left upper body: No supraclavicular adenopathy.  Skin:    General: Skin is warm.     Findings: No erythema.  Neurological:     Mental Status: He is alert and oriented to person, place, and time.     Cranial Nerves: No cranial nerve deficit.     Sensory: No sensory deficit.     Coordination: Coordination normal.     Gait: Gait normal.     Deep Tendon Reflexes:     Reflex Scores:      Bicep reflexes are 2+ on the right side and 2+ on the left side.      Patellar reflexes are 2+ on the right side and 2+ on the left side. Psychiatric:        Mood and Affect: Mood and affect normal.   ASSESSMENT AND PLAN:  Mr.Areeb was seen today for annual exam and follow-up.  Diagnoses and all orders for this visit:  Orders Placed This Encounter  Procedures   PSA(Must document that pt has been informed of limitations of PSA testing.)   Hepatitis C antibody screen   Comprehensive metabolic panel   Hemoglobin A1c   Lipid panel  Vitamin B12   CBC   LDL cholesterol, direct   Lab Results  Component Value Date   CHOL 285 (H) 12/21/2020   HDL 57.60 12/21/2020   LDLCALC 175 (H) 08/24/2019   LDLDIRECT 186.0 12/21/2020   TRIG (H) 12/21/2020    489.0 Triglyceride is over 400; calculations on Lipids are invalid.   CHOLHDL 5 12/21/2020   Lab Results  Component Value Date   PSA 0.43 12/21/2020   PSA 0.32 07/29/2013   Lab Results  Component Value Date   WBC 6.4 12/21/2020   HGB 12.7 (L) 12/21/2020   HCT 40.7 12/21/2020   MCV 75.0 (L) 12/21/2020   PLT 209.0 12/21/2020   Lab Results  Component  Value Date   ALT 53 12/21/2020   AST 46 (H) 12/21/2020   ALKPHOS 70 12/21/2020   BILITOT 0.3 12/21/2020   Lab Results  Component Value Date   HGBA1C 6.4 12/21/2020   Lab Results  Component Value Date   VITAMINB12 370 12/21/2020   Routine general medical examination at a health care facility We discussed the importance of regular physical activity and healthy diet for prevention of chronic illness and/or complications. Preventive guidelines reviewed. Vaccination up to date. Recommend decreasing alcohol intake as well as smoking cessation.  Next CPE in a year. The 10-year ASCVD risk score (Arnett DK, et al., 2019) is: 17.3%   Values used to calculate the score:     Age: 89 years     Sex: Male     Is Non-Hispanic African American: Yes     Diabetic: No     Tobacco smoker: Yes     Systolic Blood Pressure: 938 mmHg     Is BP treated: Yes     HDL Cholesterol: 57.6 mg/dL     Total Cholesterol: 285 mg/dL  Encounter for HCV screening test for low risk patient -     Hepatitis C antibody screen  Numbness of left foot We discussed possible etiologies, chronic. ? Morton neuroma. He is not interested in podiatrist evaluation at this time. Further recommendations according to lab results.  Prostate cancer screening -     PSA(Must document that pt has been informed of limitations of PSA testing.)  Screening for endocrine, metabolic and immunity disorder -     Hemoglobin A1c -     Comprehensive metabolic panel  Hyperlipidemia, mixed Non pharmacologic treatment recommended for now. Further recommendations will be given according to 10 years CVD risk score and lipid panel numbers.  Hypertension, essential, benign BP adequately controlled. Continue Amlodipine 2.5 mg daily. Low salt diet recommended.  -     amLODipine (NORVASC) 2.5 MG tablet; Take 1 tablet (2.5 mg total) by mouth daily.  Lesion of external ear canal, left It seems to be benign. Topical abx/steroid treatment  recommended. Monitor for symptoms.  -     ciprofloxacin-dexamethasone (CIPRODEX) OTIC suspension; Place 4 drops into the left ear 2 (two) times daily for 7 days.  Return in 4 months (on 04/20/2021).  Ammara Raj G. [place], MD  [facility]. [place] office.

## 2020-12-21 NOTE — Patient Instructions (Addendum)
A few things to remember from today's visit:  Routine general medical examination at a health care facility  Encounter for HCV screening test for low risk patient - Plan: Hepatitis C antibody screen  Numbness of left foot - Plan: Vitamin B12, CBC  Prostate cancer screening - Plan: PSA(Must document that pt has been informed of limitations of PSA testing.)  Screening for endocrine, metabolic and immunity disorder - Plan: Comprehensive metabolic panel, Hemoglobin A1c  Hyperlipidemia, mixed - Plan: Lipid panel  Hypertension, essential, benign - Plan: amLODipine (NORVASC) 2.5 MG tablet  If you need refills please call your pharmacy. Do not use My Chart to request refills or for acute issues that need immediate attention.   Please be sure medication list is accurate. If a new problem present, please set up appointment sooner than planned today.  Health Maintenance, Male Adopting a healthy lifestyle and getting preventive care are important in promoting health and wellness. Ask your health care provider about: The right schedule for you to have regular tests and exams. Things you can do on your own to prevent diseases and keep yourself healthy. What should I know about diet, weight, and exercise? Eat a healthy diet  Eat a diet that includes plenty of vegetables, fruits, low-fat dairy products, and lean protein. Do not eat a lot of foods that are high in solid fats, added sugars, or sodium. Maintain a healthy weight Body mass index (BMI) is a measurement that can be used to identify possible weight problems. It estimates body fat based on height and weight. Your health care provider can help determine your BMI and help you achieve or maintain a healthy weight. Get regular exercise Get regular exercise. This is one of the most important things you can do for your health. Most adults should: Exercise for at least 150 minutes each week. The exercise should increase your heart rate and make  you sweat (moderate-intensity exercise). Do strengthening exercises at least twice a week. This is in addition to the moderate-intensity exercise. Spend less time sitting. Even light physical activity can be beneficial. Watch cholesterol and blood lipids Have your blood tested for lipids and cholesterol at 53 years of age, then have this test every 5 years. You may need to have your cholesterol levels checked more often if: Your lipid or cholesterol levels are high. You are older than 53 years of age. You are at high risk for heart disease. What should I know about cancer screening? Many types of cancers can be detected early and may often be prevented. Depending on your health history and family history, you may need to have cancer screening at various ages. This may include screening for: Colorectal cancer. Prostate cancer. Skin cancer. Lung cancer. What should I know about heart disease, diabetes, and high blood pressure? Blood pressure and heart disease High blood pressure causes heart disease and increases the risk of stroke. This is more likely to develop in people who have high blood pressure readings or are overweight. Talk with your health care provider about your target blood pressure readings. Have your blood pressure checked: Every 3-5 years if you are 41-18 years of age. Every year if you are 70 years old or older. If you are between the ages of 27 and 43 and are a current or former smoker, ask your health care provider if you should have a one-time screening for abdominal aortic aneurysm (AAA). Diabetes Have regular diabetes screenings. This checks your fasting blood sugar level. Have the screening  done: Once every three years after age 28 if you are at a normal weight and have a low risk for diabetes. More often and at a younger age if you are overweight or have a high risk for diabetes. What should I know about preventing infection? Hepatitis B If you have a higher risk  for hepatitis B, you should be screened for this virus. Talk with your health care provider to find out if you are at risk for hepatitis B infection. Hepatitis C Blood testing is recommended for: Everyone born from [birth year] through 1963. Anyone with known risk factors for hepatitis C. Sexually transmitted infections (STIs) You should be screened each year for STIs, including gonorrhea and chlamydia, if: You are sexually active and are younger than 53 years of age. You are older than 53 years of age and your health care provider tells you that you are at risk for this type of infection. Your sexual activity has changed since you were last screened, and you are at increased risk for chlamydia or gonorrhea. Ask your health care provider if you are at risk. Ask your health care provider about whether you are at high risk for HIV. Your health care provider may recommend a prescription medicine to help prevent HIV infection. If you choose to take medicine to prevent HIV, you should first get tested for HIV. You should then be tested every 3 months for as long as you are taking the medicine. Follow these instructions at home: Alcohol use Do not drink alcohol if your health care provider tells you not to drink. If you drink alcohol: Limit how much you have to 0-2 drinks a day. Know how much alcohol is in your drink. In the [place] one drink equals one 12 oz bottle of beer (355 mL), one 5 oz glass of wine (148 mL), or one 1 oz glass of hard liquor (44 mL). Lifestyle Do not use any products that contain nicotine or tobacco. These products include cigarettes, chewing tobacco, and vaping devices, such as e-cigarettes. If you need help quitting, ask your health care provider. Do not use street drugs. Do not share needles. Ask your health care provider for help if you need support or information about quitting drugs. General instructions Schedule regular health, dental, and eye exams. Stay current with your  vaccines. Tell your health care provider if: You often feel depressed. You have ever been abused or do not feel safe at home. Summary Adopting a healthy lifestyle and getting preventive care are important in promoting health and wellness. Follow your health care provider's instructions about healthy diet, exercising, and getting tested or screened for diseases. Follow your health care provider's instructions on monitoring your cholesterol and blood pressure. This information is not intended to replace advice given to you by your health care provider. Make sure you discuss any questions you have with your health care provider. Document Revised: 06/18/2020 Document Reviewed: 06/18/2020 Elsevier Patient Education  [place].

## 2020-12-22 MED ORDER — ROSUVASTATIN CALCIUM 20 MG PO TABS: 20.0000 mg | ORAL_TABLET | Freq: Every day | ORAL | 3 refills | Status: DC

## 2020-12-24 LAB — HEPATITIS C ANTIBODY
Hepatitis C Ab: NONREACTIVE
SIGNAL TO CUT-OFF: 0.05 (ref <1.00)

## 2021-01-17 ENCOUNTER — Other Ambulatory Visit: Payer: Self-pay | Admitting: Family Medicine

## 2021-01-17 DIAGNOSIS — I10 Essential (primary) hypertension: Secondary | ICD-10-CM

## 2021-02-09 ENCOUNTER — Other Ambulatory Visit: Payer: Self-pay | Admitting: Family Medicine

## 2021-02-09 DIAGNOSIS — I10 Essential (primary) hypertension: Secondary | ICD-10-CM

## 2021-02-12 MED ORDER — AMLODIPINE BESYLATE 2.5 MG PO TABS: 2.5000 mg | ORAL_TABLET | Freq: Every day | ORAL | 2 refills | Status: DC

## 2021-03-04 ENCOUNTER — Encounter: Payer: Self-pay | Admitting: Family Medicine

## 2021-03-04 ENCOUNTER — Ambulatory Visit (INDEPENDENT_AMBULATORY_CARE_PROVIDER_SITE_OTHER): Payer: Managed Care, Other (non HMO) | Admitting: Family Medicine

## 2021-03-04 VITALS — BP 124/80 | HR 67 | Resp 16 | Ht 72.0 in | Wt 254.0 lb

## 2021-03-04 DIAGNOSIS — L97521 Non-pressure chronic ulcer of other part of left foot limited to breakdown of skin: Secondary | ICD-10-CM | POA: Diagnosis not present

## 2021-03-04 DIAGNOSIS — L03116 Cellulitis of left lower limb: Secondary | ICD-10-CM

## 2021-03-04 DIAGNOSIS — L97511 Non-pressure chronic ulcer of other part of right foot limited to breakdown of skin: Secondary | ICD-10-CM

## 2021-03-04 MED ORDER — SULFAMETHOXAZOLE-TRIMETHOPRIM 800-160 MG PO TABS: 1.0000 | ORAL_TABLET | Freq: Two times a day (BID) | ORAL | 0 refills | Status: AC

## 2021-03-04 NOTE — Progress Notes (Signed)
ACUTE VISIT Chief Complaint  Patient presents with   foot issue    Pain and skin peeling   HPI: Rick Harris is a 56 y.o. male with hx of hyperlipidemia, HTN,asthma,prediabetes,OSA here today  with his wife complaining of tender skin lesion on left plantar area He noted problem 4 days ago, noted soak of left foot stuck to skin, he did pull it off and noted purulent drainage on soak. He noted edema and plantar tenderness yesterday.  He has small ulcer on right foot, not tender. Lesions are not pruritic.  He just got new work boots 1-2 weeks ago.  Pain is moderate, it exacerbated by movement and palpation. No hx of trauma. No ulcers elsewhere or skin rash. He applied Cortizone cream x 2.  Negative for fever,chills,myalgias,gross hematuria,foam in urine, numbness,tingling,or arthralgias.  Hx of prediabetes. Denies abdominal pain, nausea,vomiting, polydipsia,polyuria, or polyphagia.  Lab Results  Component Value Date   HGBA1C 6.4 12/21/2020   Immunization History  Administered Date(s) Administered   PFIZER Comirnaty(Gray Top)Covid-19 Tri-Sucrose Vaccine 05/12/2019, 06/07/2019   Td 02/11/2008   Tdap 09/04/2014, 12/17/2020   Review of Systems  Constitutional:  Positive for activity change. Negative for appetite change and fatigue.  HENT:  Negative for mouth sores and sore throat.   Respiratory:  Negative for cough, shortness of breath and wheezing.   Cardiovascular:  Negative for chest pain and palpitations.  Genitourinary:  Negative for dysuria and genital sores.  Neurological:  Negative for weakness.  Hematological:  Negative for adenopathy. Does not bruise/bleed easily.  Rest see pertinent positives and negatives per HPI.  Current Outpatient Medications on File Prior to Visit  Medication Sig Dispense Refill   albuterol (PROVENTIL HFA;VENTOLIN HFA) 108 (90 Base) MCG/ACT inhaler Inhale 2 puffs into the lungs every 4 (four) hours as needed for wheezing or shortness of  breath. 1 Inhaler 3   amLODipine (NORVASC) 2.5 MG tablet Take 1 tablet (2.5 mg total) by mouth daily. 90 tablet 2   mometasone (NASONEX) 50 MCG/ACT nasal spray Place 2 sprays into the nose daily. 17 g 5   rosuvastatin (CRESTOR) 20 MG tablet Take 1 tablet (20 mg total) by mouth daily. 90 tablet 3   No current facility-administered medications on file prior to visit.   Past Medical History:  Diagnosis Date   Allergy    seasonal allergies   Asthma    uses inhaler when needed   Frequent headaches    Heart murmur    childhood   Hypercholesteremia    on meds   Hypertension    on meds   OSA (obstructive sleep apnea)    mild-mod (11/2013), declined CPAP to work on weight reduction   Sleep apnea    not have a CPAP machine   No Known Allergies  Social History   Socioeconomic History   Marital status: Married    Spouse name: Not on file   Number of children: 4   Years of education: 14   Highest education level: Some college, no degree  Occupational History   Occupation: Art therapist  Tobacco Use   Smoking status: Some Days    Packs/day: 0.25    Types: Cigarettes   Smokeless tobacco: Never  Vaping Use   Vaping Use: Never used  Substance and Sexual Activity   Alcohol use: Yes    Alcohol/week: 36.0 standard drinks    Types: 6 Standard drinks or equivalent, 30 Cans of beer per week   Drug use: No   Sexual activity:  Not on file  Other Topics Concern   Not on file  Social History Narrative   Fun: Swim, drink beer    Social Determinants of Health   Financial Resource Strain: Medium Risk   Difficulty of Paying Living Expenses: Somewhat hard  Food Insecurity: Food Insecurity Present   Worried About Charity fundraiser in the Last Year: Often true   Arboriculturist in the Last Year: Often true  Transportation Needs: No Transportation Needs   Lack of Transportation (Medical): No   Lack of Transportation (Non-Medical): No  Physical Activity: Sufficiently Active   Days of  Exercise per Week: 2 days   Minutes of Exercise per Session: 120 min  Stress: Stress Concern Present   Feeling of Stress : To some extent  Social Connections: Moderately Integrated   Frequency of Communication with Friends and Family: Once a week   Frequency of Social Gatherings with Friends and Family: Once a week   Attends Religious Services: More than 4 times per year   Active Member of [employer] or Organizations: Yes   Attends Archivist Meetings: Not on file   Marital Status: Married   Vitals:   03/04/21 1420  BP: 124/80  Pulse: 67  Resp: 16  SpO2: 97%   Body mass index is 34.45 kg/m.  Physical Exam Vitals and nursing note reviewed.  Constitutional:      General: He is not in acute distress.    Appearance: He is well-developed.  HENT:     Head: Normocephalic and atraumatic.  Eyes:     Conjunctiva/sclera: Conjunctivae normal.  Cardiovascular:     Rate and Rhythm: Normal rate and regular rhythm.     Pulses:          Dorsalis pedis pulses are 2+ on the right side and 2+ on the left side.  Pulmonary:     Effort: Pulmonary effort is normal. No respiratory distress.  Skin:    General: Skin is warm.     Findings: Abrasion present. No erythema or rash.     Comments: On arch with hyperpigmented changes and thick scaly area (right) with small ulcer. Left foot with rounded ulcer,surrounded by erythema and edema. Tender with palpation, no purulent drainage appreciated. See pictures.  Neurological:     General: No focal deficit present.     Mental Status: He is alert and oriented to person, place, and time.     Comments: Normal monofilament of both foot.  Psychiatric:     Comments: Well groomed, good eye contact.      Left foot.  ASSESSMENT AND PLAN: Rick Harris was seen today for foot issue.  Diagnoses and all orders for this visit:  Ulcer of both feet, limited to breakdown of skin ([place]) We discussed possible etiologies. New boots for work could have caused  problem. He cannot wear tennis shoes or other shoe wear. Shoe inserts may help. Keeps lesions clean with soap and water. Excuse note for work given.  Cellulitis of left foot I do not think imaging is needed at this time. Bactrim DS bid x 7 d recommended. LE elevation. Instructed about warning signs.  -     sulfamethoxazole-trimethoprim (BACTRIM DS) 800-160 MG tablet; Take 1 tablet by mouth 2 (two) times daily for 7 days.  Return if symptoms worsen or fail to improve, for Keep f/u appt..  Rolen Conger G. Martinique, MD  [facility]. [place] office.

## 2021-03-04 NOTE — Patient Instructions (Addendum)
A few things to remember from today's visit:   Ulcer of both feet, limited to breakdown of skin (HCC)  Cellulitis of left foot - Plan: sulfamethoxazole-trimethoprim (BACTRIM DS) 800-160 MG tablet  If you need refills please call your pharmacy. Do not use My Chart to request refills or for acute issues that need immediate attention.   Keep wound clean with soap and water. Shoe inserts may help. Wear tennis shoes if allowed at work.  Please be sure medication list is accurate. If a new problem present, please set up appointment sooner than planned today.

## 2021-03-06 ENCOUNTER — Ambulatory Visit: Payer: Managed Care, Other (non HMO) | Admitting: Family Medicine

## 2021-03-12 ENCOUNTER — Encounter: Payer: Self-pay | Admitting: Family Medicine

## 2021-03-12 ENCOUNTER — Telehealth: Payer: Self-pay | Admitting: Family Medicine

## 2021-03-12 NOTE — Telephone Encounter (Signed)
Letter faxed to [facility] as requested.

## 2021-03-12 NOTE — Telephone Encounter (Signed)
Patient wife called in stating that the patient job needs a note faxed over stating that he's clear to go back to work.  Note could be faxed to [phone] with an attn to [employer].  Please advise.

## 2021-04-19 NOTE — Progress Notes (Signed)
HPI: [name] is a 56 y.o. male, who is here today with his wife here today for follow up. He was last seen on 03/04/21.  Hypertension:  Medications: Amlodipine 2.5 mg daily. BP readings at home: Not checking. Side effects: None Negative for unusual or severe headache, visual changes, exertional chest pain, dyspnea,  focal weakness, or edema.  Hx of sinus bradycardia. He has not noted dizziness,diaphoresis,or palpitations.  Lab Results  Component Value Date   CREATININE 1.28 12/21/2020   BUN 18 12/21/2020   NA 137 12/21/2020   K 4.1 12/21/2020   CL 102 12/21/2020   CO2 27 12/21/2020   Hyperlipidemia: Currently on rosuvastatin 20 mg daily. Following a low fat diet: Improved.. Side effects from medication:None Lab Results  Component Value Date   CHOL 285 (H) 12/21/2020   HDL 57.60 12/21/2020   LDLCALC 175 (H) 08/24/2019   LDLDIRECT 186.0 12/21/2020   TRIG (H) 12/21/2020    489.0 Triglyceride is over 400; calculations on Lipids are invalid.   CHOLHDL 5 12/21/2020   Prediabetes: Negative for polydipsia,polyuria, or polyphagia.  Lab Results  Component Value Date   HGBA1C 6.4 12/21/2020   Since his last visit he has made some dietary changes. Decreased rice and bread intake. He is not exercising regularly.  Asthma: He is on albuterol inhaler 2 puff every 6 hours as needed. "Every once in a while" wheezing and coughing in the morning, about 1-2 times per week. His wife has noted symptoms when he sleeps on his back, she has not noted sleep apnea. He has hx of OSA, mild to moderate, he is not on CPAP, he has not seen pulmonologist since 2015.  Some days tobacco use: He has not smoked for 2-3 weeks.  Review of Systems  Constitutional:  Positive for fatigue. Negative for activity change, appetite change and fever.  HENT:  Negative for nosebleeds and sore throat.   Gastrointestinal:  Negative for abdominal pain, nausea and vomiting.  Genitourinary:  Negative for  decreased urine volume, dysuria and hematuria.  Neurological:  Negative for dizziness, syncope, facial asymmetry and weakness.  Rest see pertinent positives and negatives per HPI.  Current Outpatient Medications on File Prior to Visit  Medication Sig Dispense Refill   albuterol (PROVENTIL HFA;VENTOLIN HFA) 108 (90 Base) MCG/ACT inhaler Inhale 2 puffs into the lungs every 4 (four) hours as needed for wheezing or shortness of breath. 1 Inhaler 3   amLODipine (NORVASC) 2.5 MG tablet Take 1 tablet (2.5 mg total) by mouth daily. 90 tablet 2   mometasone (NASONEX) 50 MCG/ACT nasal spray Place 2 sprays into the nose daily. 17 g 5   rosuvastatin (CRESTOR) 20 MG tablet Take 1 tablet (20 mg total) by mouth daily. 90 tablet 3   No current facility-administered medications on file prior to visit.   Past Medical History:  Diagnosis Date   Allergy    seasonal allergies   Asthma    uses inhaler when needed   Frequent headaches    Heart murmur    childhood   Hypercholesteremia    on meds   Hypertension    on meds   OSA (obstructive sleep apnea)    mild-mod (11/2013), declined CPAP to work on weight reduction   Sleep apnea    not have a CPAP machine   No Known Allergies  Social History   Socioeconomic History   Marital status: Married    Spouse name: Not on file   Number of children: 4  Years of education: 65   Highest education level: Some college, no degree  Occupational History   Occupation: Art therapist  Tobacco Use   Smoking status: Some Days    Packs/day: 0.25    Types: Cigarettes   Smokeless tobacco: Never  Vaping Use   Vaping Use: Never used  Substance and Sexual Activity   Alcohol use: Yes    Alcohol/week: 36.0 standard drinks    Types: 6 Standard drinks or equivalent, 30 Cans of beer per week   Drug use: No   Sexual activity: Not on file  Other Topics Concern   Not on file  Social History Narrative   Fun: Swim, drink beer    Social Determinants of Health    Financial Resource Strain: Medium Risk   Difficulty of Paying Living Expenses: Somewhat hard  Food Insecurity: Food Insecurity Present   Worried About Charity fundraiser in the Last Year: Often true   Arboriculturist in the Last Year: Often true  Transportation Needs: No Transportation Needs   Lack of Transportation (Medical): No   Lack of Transportation (Non-Medical): No  Physical Activity: Sufficiently Active   Days of Exercise per Week: 2 days   Minutes of Exercise per Session: 120 min  Stress: Stress Concern Present   Feeling of Stress : To some extent  Social Connections: Moderately Integrated   Frequency of Communication with Friends and Family: Once a week   Frequency of Social Gatherings with Friends and Family: Once a week   Attends Religious Services: More than 4 times per year   Active Member of [employer] or Organizations: Yes   Attends Archivist Meetings: Not on file   Marital Status: Married   Vitals:   04/22/21 0657  BP: 124/80  Pulse: (!) 55  Resp: 16  SpO2: 98%   Wt Readings from Last 3 Encounters:  04/22/21 247 lb (112 kg)  03/04/21 254 lb (115.2 kg)  12/21/20 257 lb (116.6 kg)   Body mass index is 33.5 kg/m.  Physical Exam Vitals and nursing note reviewed.  Constitutional:      General: He is not in acute distress.    Appearance: He is well-developed.  HENT:     Head: Normocephalic and atraumatic.     Mouth/Throat:     Mouth: Mucous membranes are moist.     Pharynx: Oropharynx is clear.  Eyes:     Conjunctiva/sclera: Conjunctivae normal.  Cardiovascular:     Rate and Rhythm: Regular rhythm. Bradycardia present.     Pulses:          Dorsalis pedis pulses are 2+ on the right side and 2+ on the left side.     Heart sounds: No murmur heard. Pulmonary:     Effort: Pulmonary effort is normal. No respiratory distress.     Breath sounds: Normal breath sounds.  Abdominal:     Palpations: Abdomen is soft. There is no hepatomegaly or mass.      Tenderness: There is no abdominal tenderness.  Lymphadenopathy:     Cervical: No cervical adenopathy.  Skin:    General: Skin is warm.     Findings: No erythema or rash.  Neurological:     Mental Status: He is alert and oriented to person, place, and time.     Cranial Nerves: No cranial nerve deficit.     Gait: Gait normal.  Psychiatric:     Comments: Well groomed, good eye contact.   ASSESSMENT AND PLAN:  [name]  was seen today for follow-up.  Diagnoses and all orders for this visit: Orders Placed This Encounter  Procedures   Comprehensive metabolic panel   Hemoglobin A1c   Lipid panel   EKG 12-Lead   Lab Results  Component Value Date   HGBA1C 6.1 04/22/2021   Lab Results  Component Value Date   CREATININE 1.14 04/22/2021   BUN 13 04/22/2021   NA 140 04/22/2021   K 4.6 04/22/2021   CL 104 04/22/2021   CO2 28 04/22/2021   Lab Results  Component Value Date   ALT 44 04/22/2021   AST 33 04/22/2021   ALKPHOS 73 04/22/2021   BILITOT 0.4 04/22/2021   Prediabetes Consistency with a healthy life style encouraged for diabetes prevention. Further recommendation will be given according to hemoglobin A1c result.  Hypertension, essential, benign BP adequately controlled. Continue current management: Amlodipine 2.5 mg daily. DASH/low salt diet to continue. Instructed to monitor BP at home. Eye exam recommended annually, next appt in 2 weeks.  Hyperlipidemia Continue Rosuvastatin 20 mg daily and low fat diet. Further recommendations according to FLP results.  Asthma in adult, mild intermittent, uncomplicated Problem is not well controlled. Recommed adding Symbicort 160-4.5 mcg 2 puff twice daily for 3 to 4 months, then he can try as needed if symptoms are well controlled. Continue albuterol inhaler 2 puff every 4-6 hours as needed. Strongly recommend continuing tobacco avoidance.  Bradycardia Asymptomatic. EKG today: Sinus bradycardia, normal axis and  intervals. Unspecific T wave abnormalities in inferior leads,early repolarization,? IVCD . Compared with EKG done on 07/29/13 no significant changes. He is asymptomatic, so I do not thinks further testing is needed but he was clearly instructed about warning signs.  Upon reviewing records he was hospitalized in 06/2010 because of episode of syncope.  According to note echo study was negative for wall motion abnormalities.  Cardiolite stress test was scheduled for 12/24/2010 at the time of hospital discharge, I can not find report.  Class 1 obesity with body mass index (BMI) of 33.0 to 33.9 in adult Since his last visit he has lost about 7 Lb. We discussed benefits of wt loss. Consistency with healthy diet and physical activity encouraged.   OSA (obstructive sleep apnea) Last pulmonology follow up in 2015. Upon reviewing his last pulmonology visit, OSA was mild to moderate and weight loss was recommended.  I spent a total of 43 minutes in both face to face and non face to face activities for this visit on the date of this encounter. During this time history was obtained and documented, examination was performed, prior labs/imaging reviewed, and assessment/plan discussed.  1: 28 Pm: Tried to contact pt to inquire about stress test ordered in 06/2010 and to discuss lab results, no answer, left message. Sent results through My chart.  Return in about 36 weeks (around 12/30/2021) for CPE.  Bryan Goin G. [place], MD  [facility]. [place] office.

## 2021-04-22 ENCOUNTER — Ambulatory Visit (INDEPENDENT_AMBULATORY_CARE_PROVIDER_SITE_OTHER): Payer: Managed Care, Other (non HMO) | Admitting: Family Medicine

## 2021-04-22 ENCOUNTER — Encounter: Payer: Self-pay | Admitting: Family Medicine

## 2021-04-22 VITALS — BP 124/80 | HR 55 | Resp 16 | Ht 72.0 in | Wt 247.0 lb

## 2021-04-22 DIAGNOSIS — E782 Mixed hyperlipidemia: Secondary | ICD-10-CM

## 2021-04-22 DIAGNOSIS — R001 Bradycardia, unspecified: Secondary | ICD-10-CM

## 2021-04-22 DIAGNOSIS — J4520 Mild intermittent asthma, uncomplicated: Secondary | ICD-10-CM | POA: Diagnosis not present

## 2021-04-22 DIAGNOSIS — I10 Essential (primary) hypertension: Secondary | ICD-10-CM

## 2021-04-22 DIAGNOSIS — G4733 Obstructive sleep apnea (adult) (pediatric): Secondary | ICD-10-CM

## 2021-04-22 DIAGNOSIS — R7303 Prediabetes: Secondary | ICD-10-CM

## 2021-04-22 DIAGNOSIS — Z6833 Body mass index (BMI) 33.0-33.9, adult: Secondary | ICD-10-CM

## 2021-04-22 DIAGNOSIS — E6609 Other obesity due to excess calories: Secondary | ICD-10-CM

## 2021-04-22 LAB — COMPREHENSIVE METABOLIC PANEL
ALT: 44 U/L (ref 0–53)
AST: 33 U/L (ref 0–37)
Albumin: 4.4 g/dL (ref 3.5–5.2)
Alkaline Phosphatase: 73 U/L (ref 39–117)
BUN: 13 mg/dL (ref 6–23)
CO2: 28 mEq/L (ref 19–32)
Calcium: 9.8 mg/dL (ref 8.4–10.5)
Chloride: 104 mEq/L (ref 96–112)
Creatinine, Ser: 1.14 mg/dL (ref 0.40–1.50)
GFR: 73.39 mL/min (ref >60.00)
Glucose, Bld: 98 mg/dL (ref 70–99)
Potassium: 4.6 mEq/L (ref 3.5–5.1)
Sodium: 140 mEq/L (ref 135–145)
Total Bilirubin: 0.4 mg/dL (ref 0.2–1.2)
Total Protein: 7.5 g/dL (ref 6.0–8.3)

## 2021-04-22 LAB — LIPID PANEL
Cholesterol: 159 mg/dL (ref 0–200)
HDL: 57.2 mg/dL (ref >39.00)
LDL Cholesterol: 76 mg/dL (ref 0–99)
NonHDL: 101.75
Total CHOL/HDL Ratio: 3
Triglycerides: 130 mg/dL (ref 0.0–149.0)
VLDL: 26 mg/dL (ref 0.0–40.0)

## 2021-04-22 LAB — HEMOGLOBIN A1C: Hgb A1c MFr Bld: 6.1 % (ref 4.6–6.5)

## 2021-04-22 MED ORDER — BUDESONIDE-FORMOTEROL FUMARATE 160-4.5 MCG/ACT IN AERO: 2.0000 | INHALATION_SPRAY | Freq: Two times a day (BID) | RESPIRATORY_TRACT | 5 refills | Status: DC

## 2021-04-22 NOTE — Assessment & Plan Note (Signed)
BP adequately controlled. ?Continue current management: Amlodipine 2.5 mg daily. ?DASH/low salt diet to continue. ?Instructed to monitor BP at home. ?Eye exam recommended annually, next appt in 2 weeks. ?

## 2021-04-22 NOTE — Assessment & Plan Note (Signed)
Last pulmonology follow up in 2015. ?Upon reviewing his last pulmonology visit, OSA was mild to moderate and weight loss was recommended. ?

## 2021-04-22 NOTE — Assessment & Plan Note (Signed)
Since his last visit he has lost about 7 Lb. ?We discussed benefits of wt loss. ?Consistency with healthy diet and physical activity encouraged. ? ?

## 2021-04-22 NOTE — Assessment & Plan Note (Addendum)
Problem is not well controlled. ?Recommed adding Symbicort 160-4.5 mcg 2 puff twice daily for 3 to 4 months, then he can try as needed if symptoms are well controlled. ?Continue albuterol inhaler 2 puff every 4-6 hours as needed. ?Strongly recommend continuing tobacco avoidance. ?

## 2021-04-22 NOTE — Assessment & Plan Note (Addendum)
Consistency with a healthy life style encouraged for diabetes prevention. ?Further recommendation will be given according to hemoglobin A1c result. ?

## 2021-04-22 NOTE — Patient Instructions (Addendum)
A few things to remember from today's visit:  Hypertension, essential, benign - Plan: EKG 12-Lead, Comprehensive metabolic panel  Mixed hyperlipidemia - Plan: Comprehensive metabolic panel, Lipid panel  Prediabetes - Plan: Hemoglobin A1c  Asthma in adult, mild intermittent, uncomplicated - Plan: budesonide-formoterol (SYMBICORT) 160-4.5 MCG/ACT inhaler  Ideally blood pressure should be at 130/80 or less. Because having asthma exacerbations 2 times per week, Symbicort added to use daily 2 times for at least 3-4 months, then you can continue as needed if symptoms are well controlled. Rinse mouth after use. Continue Albuterol inh as needed.  If you need refills please call your pharmacy. Do not use My Chart to request refills or for acute issues that need immediate attention.   Please be sure medication list is accurate. If a new problem present, please set up appointment sooner than planned today.  DASH Eating Plan DASH stands for Dietary Approaches to Stop Hypertension. The DASH eating plan is a healthy eating plan that has been shown to: Reduce high blood pressure (hypertension). Reduce your risk for type 2 diabetes, heart disease, and stroke. Help with weight loss. What are tips for following this plan? Reading food labels Check food labels for the amount of salt (sodium) per serving. Choose foods with less than 5 percent of the Daily Value of sodium. Generally, foods with less than 300 milligrams (mg) of sodium per serving fit into this eating plan. To find whole grains, look for the word "whole" as the first word in the ingredient list. Shopping Buy products labeled as "low-sodium" or "no salt added." Buy fresh foods. Avoid canned foods and pre-made or frozen meals. Cooking Avoid adding salt when cooking. Use salt-free seasonings or herbs instead of table salt or sea salt. Check with your health care provider or pharmacist before using salt substitutes. Do not fry foods. Cook  foods using healthy methods such as baking, boiling, grilling, roasting, and broiling instead. Cook with heart-healthy oils, such as olive, canola, avocado, soybean, or sunflower oil. Meal planning  Eat a balanced diet that includes: 4 or more servings of fruits and 4 or more servings of vegetables each day. Try to fill one-half of your plate with fruits and vegetables. 6-8 servings of whole grains each day. Less than 6 oz (170 g) of lean meat, poultry, or fish each day. A 3-oz (85-g) serving of meat is about the same size as a deck of cards. One egg equals 1 oz (28 g). 2-3 servings of low-fat dairy each day. One serving is 1 cup (237 mL). 1 serving of nuts, seeds, or beans 5 times each week. 2-3 servings of heart-healthy fats. Healthy fats called omega-3 fatty acids are found in foods such as walnuts, flaxseeds, fortified milks, and eggs. These fats are also found in cold-water fish, such as sardines, salmon, and mackerel. Limit how much you eat of: Canned or prepackaged foods. Food that is high in trans fat, such as some fried foods. Food that is high in saturated fat, such as fatty meat. Desserts and other sweets, sugary drinks, and other foods with added sugar. Full-fat dairy products. Do not salt foods before eating. Do not eat more than 4 egg yolks a week. Try to eat at least 2 vegetarian meals a week. Eat more home-cooked food and less restaurant, buffet, and fast food. Lifestyle When eating at a restaurant, ask that your food be prepared with less salt or no salt, if possible. If you drink alcohol: Limit how much you use to: 0-1  drink a day for women who are not pregnant. 0-2 drinks a day for men. Be aware of how much alcohol is in your drink. In the [place] one drink equals one 12 oz bottle of beer (355 mL), one 5 oz glass of wine (148 mL), or one 1 oz glass of hard liquor (44 mL). General information Avoid eating more than 2,300 mg of salt a day. If you have hypertension, you  may need to reduce your sodium intake to 1,500 mg a day. Work with your health care provider to maintain a healthy body weight or to lose weight. Ask what an ideal weight is for you. Get at least 30 minutes of exercise that causes your heart to beat faster (aerobic exercise) most days of the week. Activities may include walking, swimming, or biking. Work with your health care provider or dietitian to adjust your eating plan to your individual calorie needs. What foods should I eat? Fruits All fresh, dried, or frozen fruit. Canned fruit in natural juice (without added sugar). Vegetables Fresh or frozen vegetables (raw, steamed, roasted, or grilled). Low-sodium or reduced-sodium tomato and vegetable juice. Low-sodium or reduced-sodium tomato sauce and tomato paste. Low-sodium or reduced-sodium canned vegetables. Grains Whole-grain or whole-wheat bread. Whole-grain or whole-wheat pasta. Brown rice. [place]. Bulgur. Whole-grain and low-sodium cereals. Pita bread. Low-fat, low-sodium crackers. Whole-wheat flour tortillas. Meats and other proteins Skinless chicken or [place]. Ground chicken or [place]. Pork with fat trimmed off. Fish and seafood. Egg whites. Dried beans, peas, or lentils. Unsalted nuts, nut butters, and seeds. Unsalted canned beans. Lean cuts of beef with fat trimmed off. Low-sodium, lean precooked or cured meat, such as sausages or meat loaves. Dairy Low-fat (1%) or fat-free (skim) milk. Reduced-fat, low-fat, or fat-free cheeses. Nonfat, low-sodium ricotta or cottage cheese. Low-fat or nonfat yogurt. Low-fat, low-sodium cheese. Fats and oils Soft margarine without trans fats. Vegetable oil. Reduced-fat, low-fat, or light mayonnaise and salad dressings (reduced-sodium). Canola, safflower, olive, avocado, soybean, and sunflower oils. Avocado. Seasonings and condiments Herbs. Spices. Seasoning mixes without salt. Other foods Unsalted popcorn and pretzels. Fat-free sweets. The  items listed above may not be a complete list of foods and beverages you can eat. Contact a dietitian for more information. What foods should I avoid? Fruits Canned fruit in a light or heavy syrup. Fried fruit. Fruit in cream or butter sauce. Vegetables Creamed or fried vegetables. Vegetables in a cheese sauce. Regular canned vegetables (not low-sodium or reduced-sodium). Regular canned tomato sauce and paste (not low-sodium or reduced-sodium). Regular tomato and vegetable juice (not low-sodium or reduced-sodium). Angie Fava. Olives. Grains Baked goods made with fat, such as croissants, muffins, or some breads. Dry pasta or rice meal packs. Meats and other proteins Fatty cuts of meat. Ribs. Fried meat. Berniece Salines. Bologna, salami, and other precooked or cured meats, such as sausages or meat loaves. Fat from the back of a pig (fatback). Bratwurst. Salted nuts and seeds. Canned beans with added salt. Canned or smoked fish. Whole eggs or egg yolks. Chicken or [place] with skin. Dairy Whole or 2% milk, cream, and half-and-half. Whole or full-fat cream cheese. Whole-fat or sweetened yogurt. Full-fat cheese. Nondairy creamers. Whipped toppings. Processed cheese and cheese spreads. Fats and oils Butter. Stick margarine. Lard. Shortening. Ghee. Bacon fat. Tropical oils, such as coconut, palm kernel, or palm oil. Seasonings and condiments Onion salt, garlic salt, seasoned salt, table salt, and sea salt. Worcestershire sauce. Tartar sauce. Barbecue sauce. Teriyaki sauce. Soy sauce, including reduced-sodium. Steak sauce. Canned and packaged  gravies. Fish sauce. Oyster sauce. Cocktail sauce. Store-bought horseradish. Ketchup. Mustard. Meat flavorings and tenderizers. Bouillon cubes. Hot sauces. Pre-made or packaged marinades. Pre-made or packaged taco seasonings. Relishes. Regular salad dressings. Other foods Salted popcorn and pretzels. The items listed above may not be a complete list of foods and beverages you  should avoid. Contact a dietitian for more information. Where to find more information National Heart, Lung, and Blood Institute: https://wilson-eaton.com/ American Heart Association: www.heart.org Academy of Nutrition and Dietetics: www.eatright.[place]: www.kidney.org Summary The DASH eating plan is a healthy eating plan that has been shown to reduce high blood pressure (hypertension). It may also reduce your risk for type 2 diabetes, heart disease, and stroke. When on the DASH eating plan, aim to eat more fresh fruits and vegetables, whole grains, lean proteins, low-fat dairy, and heart-healthy fats. With the DASH eating plan, you should limit salt (sodium) intake to 2,300 mg a day. If you have hypertension, you may need to reduce your sodium intake to 1,500 mg a day. Work with your health care provider or dietitian to adjust your eating plan to your individual calorie needs. This information is not intended to replace advice given to you by your health care provider. Make sure you discuss any questions you have with your health care provider. Document Revised: 12/31/2018 Document Reviewed: 12/31/2018 Elsevier Patient Education  2022 [employer].

## 2021-04-22 NOTE — Assessment & Plan Note (Addendum)
Asymptomatic. ?EKG today: Sinus bradycardia, normal axis and intervals. Unspecific T wave abnormalities in inferior leads,early repolarization,? IVCD . Compared with EKG done on 07/29/13 no significant changes. ?He is asymptomatic, so I do not thinks further testing is needed but he was clearly instructed about warning signs. ? ?Upon reviewing records he was hospitalized in 06/2010 because of episode of syncope.  According to note echo study was negative for wall motion abnormalities.  Cardiolite stress test was scheduled for 12/24/2010 at the time of hospital discharge, I can not find report. ?

## 2021-04-22 NOTE — Addendum Note (Signed)
Addended by: Martinique, Raynald Rouillard G on: 04/22/2021 01:39 PM ? ? Modules accepted: Level of Service ? ?

## 2021-04-22 NOTE — Assessment & Plan Note (Signed)
Continue Rosuvastatin 20 mg daily and low fat diet. ?Further recommendations according to FLP results. ?

## 2021-05-02 ENCOUNTER — Telehealth: Payer: Self-pay | Admitting: Family Medicine

## 2021-05-02 NOTE — Telephone Encounter (Signed)
Pt is calling and can not afford budesonide-formoterol (SYMBICORT) 160-4.5 MCG/ACT inhaler due to cost is 800.00 with insurance and per pt pharm told him to call [place] office  ?[id number]  [phone] ?Fax:  [phone] ?  ? ?

## 2021-05-03 NOTE — Telephone Encounter (Signed)
Options are Dulera,Advair, and Breo among some. He can try to find out with his insurance which one is covered under his plan. ?Thanks, ?BJ ?

## 2021-05-07 NOTE — Telephone Encounter (Signed)
Wife given PCP's message & will call back to let us know which one is covered by insurance. ?

## 2021-06-12 NOTE — Progress Notes (Signed)
? ? ?ACUTE VISIT ?Chief Complaint  ?Patient presents with  ? Arm Pain  ?  Right lower arm; started after having blood drawn. Sharp pain..   ? Foreign Body in Skin  ?  Splinter in left palm.   ? ?HPI: ?Rick Harris is a 56 y.o. male with hx of prediabetes,OSA,HTN,and HLD here today complaining of pain around right elbow as described above. States that pain started after blood drown last visit, 04/22/21. ?Pain is anterior right above elbow, lateral elbow,and proximal lateral forearm. ?Constant achy and intermittent sharp. ?No edema or erythema. ?No associated neck pain. ? ?Arm Pain  ?The incident occurred more than 1 week ago. There was no injury mechanism. The pain is present in the right elbow and right forearm. The quality of the pain is described as aching. The pain does not radiate. The pain is at a severity of 6/10. The pain is moderate. The pain has been Constant since the incident. Pertinent negatives include no chest pain, muscle weakness, numbness or tingling. The symptoms are aggravated by lifting and movement. He has tried acetaminophen for the symptoms. The treatment provided mild relief.  ?Pain is not getting better. ? ?Asthma: He has not been able to afford Symbicort.  ?Cough and wheezing. ?Former smoker. ? ?He is using Albuterol inh at least once daily, morning. If working outpdoors bid. ?Allergic rhinitis: Needs refills on Nasonex nasal spray. ?Nasal congestion,rhinorrhea,and post nasal drainage. ? ?Earlier today he got a splint in left hand, piece if wood. ?Pin/needle like sensation with pressing area, mild edema. ?Right handed. ?He has not tried to remove foreign body or clean area. ? ?Review of Systems  ?Constitutional:  Positive for fatigue. Negative for activity change, appetite change, chills and fever.  ?Respiratory:  Negative for shortness of breath.   ?Cardiovascular:  Negative for chest pain.  ?Gastrointestinal:  Negative for abdominal pain, nausea and vomiting.  ?Skin:  Negative for  rash.  ?Neurological:  Negative for tingling, syncope and numbness.  ?Rest see pertinent positives and negatives per HPI. ? ?Current Outpatient Medications on File Prior to Visit  ?Medication Sig Dispense Refill  ? amLODipine (NORVASC) 2.5 MG tablet Take 1 tablet (2.5 mg total) by mouth daily. 90 tablet 2  ? rosuvastatin (CRESTOR) 20 MG tablet Take 1 tablet (20 mg total) by mouth daily. 90 tablet 3  ? ?No current facility-administered medications on file prior to visit.  ? ?Past Medical History:  ?Diagnosis Date  ? Allergy   ? seasonal allergies  ? Asthma   ? uses inhaler when needed  ? Frequent headaches   ? Heart murmur   ? childhood  ? Hypercholesteremia   ? on meds  ? Hypertension   ? on meds  ? OSA (obstructive sleep apnea)   ? mild-mod (11/2013), declined CPAP to work on weight reduction  ? Sleep apnea   ? not have a CPAP machine  ? ?No Known Allergies ? ?Social History  ? ?Socioeconomic History  ? Marital status: Married  ?  Spouse name: Not on file  ? Number of children: 4  ? Years of education: 58  ? Highest education level: Some college, no degree  ?Occupational History  ? Occupation: Art therapist  ?Tobacco Use  ? Smoking status: Former  ?  Packs/day: 0.25  ?  Types: Cigarettes  ? Smokeless tobacco: Never  ?Vaping Use  ? Vaping Use: Never used  ?Substance and Sexual Activity  ? Alcohol use: Yes  ?  Alcohol/week: 36.0 standard drinks  ?  Types: 6 Standard drinks or equivalent, 30 Cans of beer per week  ? Drug use: No  ? Sexual activity: Not on file  ?Other Topics Concern  ? Not on file  ?Social History Narrative  ? Fun: Swim, drink beer   ? ?Social Determinants of Health  ? ?Financial Resource Strain: Medium Risk  ? Difficulty of Paying Living Expenses: Somewhat hard  ?Food Insecurity: Food Insecurity Present  ? Worried About Charity fundraiser in the Last Year: Often true  ? Ran Out of Food in the Last Year: Often true  ?Transportation Needs: No Transportation Needs  ? Lack of Transportation (Medical): No   ? Lack of Transportation (Non-Medical): No  ?Physical Activity: Sufficiently Active  ? Days of Exercise per Week: 2 days  ? Minutes of Exercise per Session: 120 min  ?Stress: Stress Concern Present  ? Feeling of Stress : To some extent  ?Social Connections: Moderately Integrated  ? Frequency of Communication with Friends and Family: Once a week  ? Frequency of Social Gatherings with Friends and Family: Once a week  ? Attends Religious Services: More than 4 times per year  ? Active Member of Clubs or Organizations: Yes  ? Attends Archivist Meetings: Not on file  ? Marital Status: Married  ? ? ?Vitals:  ? 06/14/21 1408  ?BP: 128/70  ?Pulse: 60  ?Resp: 16  ?Temp: 98.9 ?F (37.2 ?C)  ?SpO2: 97%  ? ?Body mass index is 33.02 kg/m?. ? ?Physical Exam ?Vitals and nursing note reviewed.  ?Constitutional:   ?   General: He is not in acute distress. ?   Appearance: He is well-developed.  ?HENT:  ?   Head: Normocephalic and atraumatic.  ?   Nose: Rhinorrhea present.  ?   Right Turbinates: Enlarged.  ?   Left Turbinates: Enlarged.  ?   Mouth/Throat:  ?   Mouth: Mucous membranes are moist.  ?Eyes:  ?   Conjunctiva/sclera: Conjunctivae normal.  ?Neck:  ?   Trachea: No tracheal deviation.  ?Cardiovascular:  ?   Rate and Rhythm: Normal rate and regular rhythm.  ?   Heart sounds: No murmur heard. ?Pulmonary:  ?   Effort: Pulmonary effort is normal. No respiratory distress.  ?   Breath sounds: Wheezing present. No rhonchi or rales.  ?Musculoskeletal:  ?     Arms: ? ?     Hands: ? ?   Comments:  ?  ?Lymphadenopathy:  ?   Cervical: No cervical adenopathy.  ?Skin: ?   General: Skin is warm.  ?   Findings: Rash present. No erythema.  ?Neurological:  ?   General: No focal deficit present.  ?   Mental Status: He is oriented to person, place, and time.  ?Psychiatric:  ?   Comments: Well groomed, good eye contact.  ? ?ASSESSMENT AND PLAN: ? ?Rick Harris was seen today for arm pain and foreign body in skin. ? ?Diagnoses and all orders  for this visit: ?Orders Placed This Encounter  ?Procedures  ? Ambulatory referral to Physical Therapy  ? ?Splinter of left hand ?After verbal consent area was cleaned with alcohol and using sterile tissue forceps wooden splinter was removed. He tolerated procedure well. ?Tdap up to date. ?Instructed to keep area clean with soap and water. ?  ?Asthma in adult, mild intermittent, uncomplicated ?Problem is not well controlled. ?He cannot afford other inhalers. ?Singulair 10 mg daily started today. ? ?-     albuterol (VENTOLIN HFA) 108 (90 Base)  MCG/ACT inhaler; Inhale 2 puffs into the lungs every 4 (four) hours as needed for wheezing or shortness of breath. ?-     montelukast (SINGULAIR) 10 MG tablet; Take 1 tablet (10 mg total) by mouth at bedtime. ? ?Allergic rhinitis, unspecified seasonality, unspecified trigger ?Resume Nasonex nasal spray daily for a few days then prn. ?Nasal saline irrigations as needed. ?Singulair 10 mg daily. ? ?-     mometasone (NASONEX) 50 MCG/ACT nasal spray; Place 2 sprays into the nose daily. ? ?Lateral epicondylitis of right elbow ?Local ice may help. ?I do not think imaging is needed today. ?PT will be arranged. ?Celebrex 100 mg bid x 7-10 days, some side effects discussed. ? ?-     celecoxib (CELEBREX) 100 MG capsule; Take 1 capsule (100 mg total) by mouth 2 (two) times daily for 10 days. ? ?Return if symptoms worsen or fail to improve, for Keep next appt. ? ?Jiya Kissinger G. Martinique, MD ? ?[facility]. ?[place] office. ? ?

## 2021-06-14 ENCOUNTER — Encounter: Payer: Self-pay | Admitting: Family Medicine

## 2021-06-14 ENCOUNTER — Ambulatory Visit: Payer: Managed Care, Other (non HMO) | Admitting: Family Medicine

## 2021-06-14 ENCOUNTER — Ambulatory Visit (INDEPENDENT_AMBULATORY_CARE_PROVIDER_SITE_OTHER): Payer: Managed Care, Other (non HMO) | Admitting: Family Medicine

## 2021-06-14 VITALS — BP 128/70 | HR 60 | Temp 98.9°F | Resp 16 | Ht 72.0 in | Wt 243.5 lb

## 2021-06-14 DIAGNOSIS — J309 Allergic rhinitis, unspecified: Secondary | ICD-10-CM

## 2021-06-14 DIAGNOSIS — J4520 Mild intermittent asthma, uncomplicated: Secondary | ICD-10-CM | POA: Diagnosis not present

## 2021-06-14 DIAGNOSIS — S60552A Superficial foreign body of left hand, initial encounter: Secondary | ICD-10-CM | POA: Diagnosis not present

## 2021-06-14 DIAGNOSIS — M7711 Lateral epicondylitis, right elbow: Secondary | ICD-10-CM | POA: Diagnosis not present

## 2021-06-14 MED ORDER — CELECOXIB 100 MG PO CAPS: 100.0000 mg | ORAL_CAPSULE | Freq: Two times a day (BID) | ORAL | 0 refills | Status: AC

## 2021-06-14 MED ORDER — MOMETASONE FUROATE 50 MCG/ACT NA SUSP: 2.0000 | Freq: Every day | NASAL | 5 refills | Status: DC

## 2021-06-14 MED ORDER — ALBUTEROL SULFATE HFA 108 (90 BASE) MCG/ACT IN AERS: 2.0000 | INHALATION_SPRAY | RESPIRATORY_TRACT | 3 refills | Status: DC | PRN

## 2021-06-14 MED ORDER — MONTELUKAST SODIUM 10 MG PO TABS
10.0000 mg | ORAL_TABLET | Freq: Every day | ORAL | 3 refills | Status: DC
Start: 2021-06-14 — End: 2021-12-30

## 2021-06-14 NOTE — Patient Instructions (Addendum)
A few things to remember from today's visit: ? ? ?Splinter of left hand ? ?Asthma in adult, mild intermittent, uncomplicated - Plan: albuterol (VENTOLIN HFA) 108 (90 Base) MCG/ACT inhaler ? ?Allergic rhinitis, unspecified seasonality, unspecified trigger - Plan: mometasone (NASONEX) 50 MCG/ACT nasal spray ? ?Lateral epicondylitis of right elbow - Plan: celecoxib (CELEBREX) 100 MG capsule, Ambulatory referral to Physical Therapy ? ?If you need refills please call your pharmacy. ?Do not use My Chart to request refills or for acute issues that need immediate attention. ?  ?Keep ares on left hand clean with soap and water. ?Singulair 10 mg daily at bedtime. ? ?Please be sure medication list is accurate. ?If a new problem present, please set up appointment sooner than planned today. ? ? ? ? ? ? ? ?

## 2021-06-15 ENCOUNTER — Encounter: Payer: Self-pay | Admitting: Family Medicine

## 2021-07-09 ENCOUNTER — Other Ambulatory Visit: Payer: Self-pay | Admitting: Family Medicine

## 2021-07-09 DIAGNOSIS — I10 Essential (primary) hypertension: Secondary | ICD-10-CM

## 2021-07-19 ENCOUNTER — Ambulatory Visit: Payer: Managed Care, Other (non HMO) | Attending: Family Medicine

## 2021-07-19 DIAGNOSIS — M7711 Lateral epicondylitis, right elbow: Secondary | ICD-10-CM | POA: Diagnosis not present

## 2021-07-19 DIAGNOSIS — M25521 Pain in right elbow: Secondary | ICD-10-CM | POA: Insufficient documentation

## 2021-07-19 DIAGNOSIS — R252 Cramp and spasm: Secondary | ICD-10-CM | POA: Diagnosis present

## 2021-07-19 DIAGNOSIS — M25621 Stiffness of right elbow, not elsewhere classified: Secondary | ICD-10-CM | POA: Diagnosis present

## 2021-07-19 DIAGNOSIS — M6281 Muscle weakness (generalized): Secondary | ICD-10-CM | POA: Diagnosis present

## 2021-07-19 NOTE — Therapy (Signed)
OUTPATIENT PHYSICAL THERAPY SHOULDER EVALUATION   Patient Name: Rick Harris MRN: [id number] DOB:03-04-1965, 56 y.o., male Today's Date: 07/19/2021   PT End of Session - 07/19/21 0819     Visit Number 1    Date for PT Re-Evaluation 09/13/21    Authorization Type [employer]    PT Start Time 0805    PT Stop Time 0900    PT Time Calculation (min) 55 min             Past Medical History:  Diagnosis Date   Allergy    seasonal allergies   Asthma    uses inhaler when needed   Frequent headaches    Heart murmur    childhood   Hypercholesteremia    on meds   Hypertension    on meds   OSA (obstructive sleep apnea)    mild-mod (11/2013), declined CPAP to work on weight reduction   Sleep apnea    not have a CPAP machine   Past Surgical History:  Procedure Laterality Date   APPENDECTOMY  2009   NASAL SINUS SURGERY  04/1997   WISDOM TOOTH EXTRACTION  2018   Patient Active Problem List   Diagnosis Date Noted   Hypertension, essential, benign 05/05/2018   Low back pain 05/05/2018   Prediabetes 05/05/2018   Allergic rhinitis 03/11/2017   Asthma in adult, mild intermittent, uncomplicated 73/53/2992   Encounter for general adult medical examination with abnormal findings 01/07/2016   Class 1 obesity with body mass index (BMI) of 33.0 to 33.9 in adult 12/05/2014   Microcytic anemia 11/24/2013   OSA (obstructive sleep apnea)    Bradycardia    Hyperlipidemia     PCP: Betty Martinique, MD  REFERRING PROVIDER: Betty Martinique, MD  REFERRING DIAG: M77.11 (ICD-10-CM) - Lateral epicondylitis of right elbow   THERAPY DIAG:  Stiffness of right elbow, not elsewhere classified  Pain in right elbow  Cramp and spasm  Muscle weakness (generalized)  Rationale for Evaluation and Treatment Rehabilitation  ONSET DATE: 04/10/21  SUBJECTIVE:                                                                                                                                                                                       SUBJECTIVE STATEMENT: Patient states he started having right elbow pain a few months ago.  It has progressed to a point where he is getting sharp pains when he is holding something heavy or doing heavy manual labor at work.  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  Wife attends with him today.  He denies any shoulder or wrist issues in the  past.    PERTINENT HISTORY: none  PAIN:  Are you having pain? Yes: NPRS scale: 5/10 Pain location: Right elbow Pain description: aching Aggravating factors: work Relieving factors: tylenol  PRECAUTIONS: None  WEIGHT BEARING RESTRICTIONS No  FALLS:  Has patient fallen in last 6 months? No  LIVING ENVIRONMENT: Lives with: lives with their family and lives with their spouse Lives in: House/apartment  OCCUPATION: Heavy Company secretary  PLOF: Independent  PATIENT GOALS To eliminate right elbow pain and avoid being out of work  OBJECTIVE:   DIAGNOSTIC FINDINGS:  na  PATIENT SURVEYS:  FOTO 64 goal is 52  COGNITION:  Overall cognitive status: Within functional limits for tasks assessed     SENSATION: WFL  POSTURE: Rounded shoulders, bilateral scapular protraction  UPPER EXTREMITY ROM:   All WNL  UPPER EXTREMITY MMT:  All generally 5/5 with exception of shoulder ER 4/5 and wrist extension 4-/5 with pain  SHOULDER SPECIAL TESTS:  Impingement tests: Hawkins/Kennedy impingement test: negative and Painful arc test: negative  SLAP lesions: Biceps load test: negative  Instability tests: Apprehension test: negative  Rotator cuff assessment: Empty can test: negative  Biceps assessment: Speed's test: negative   PALPATION:  Tender at insertion of ECRB and common extensor tendons, trochlear and radial notches   TODAY'S TREATMENT:  Initial eval completed and initiated HEP   PATIENT EDUCATION: Education details: Initiated HEP Person educated: Patient and Spouse Education  method: Explanation, Demonstration, Corporate treasurer cues, Verbal cues, and Handouts Education comprehension: verbalized understanding, returned demonstration, verbal cues required, and tactile cues required   HOME EXERCISE PROGRAM: Access Code: [email or web address] URL: [email or web address] Date: 07/19/2021 Prepared by: Candyce Churn  Exercises - Standing Wrist Flexion Stretch  - 1 x daily - 7 x weekly - 1 sets - 10 reps - Seated Eccentric Wrist Extension  - 1 x daily - 7 x weekly - 3 sets - 10 reps - Prone Shoulder Extension - Single Arm  - 2 x daily - 7 x weekly - 2 sets - 10 reps - Prone Shoulder Row  - 2 x daily - 7 x weekly - 2 sets - 10 reps - Prone Single Arm Shoulder Horizontal Abduction with Scapular Retraction and Palm Down  - 2 x daily - 7 x weekly - 2 sets - 10 reps - Sidelying Shoulder External Rotation  - 2 x daily - 7 x weekly - 2 sets - 10 reps - Single Arm Serratus Punches in Supine with Dumbbell  - 2 x daily - 7 x weekly - 2 sets - 10 reps   ASSESSMENT:  CLINICAL IMPRESSION: Patient is a 56 y.o. male who was seen today for physical therapy evaluation and treatment for right lateral epicondylitis. He presents with tenderness to palpation and pain with wrist extension.  He is a heavy Company secretary and does heavy manual labor for the city of [employer].  He was able to tolerate HEP initiation.  Although he tested 5/5 with shoulder ER, he fatigued very easily with sidelying ER strengthening.  He would respond well to eccentric wrist extension strengthening, cross friction massage to ECRB, wrist extensor stretching, ice, and addition of tennis elbow strap.  MD did not prescribe any new medications.  He is currently taking Tylenol.  He would benefit from skilled PT to address the impairments below.     OBJECTIVE IMPAIRMENTS decreased ROM, decreased strength, increased fascial restrictions, increased muscle spasms, impaired flexibility, impaired UE functional use, postural  dysfunction, and pain.   ACTIVITY LIMITATIONS carrying,  bathing, dressing, and hygiene/grooming  PARTICIPATION LIMITATIONS: meal prep, cleaning, laundry, occupation, and yard work  PERSONAL FACTORS Profession and Time since onset of injury/illness/exacerbation are also affecting patient's functional outcome.   REHAB POTENTIAL: Good  CLINICAL DECISION MAKING: Stable/uncomplicated  EVALUATION COMPLEXITY: Low   GOALS: Goals reviewed with patient? Yes  SHORT TERM GOALS: Target date: 08/16/2021  (Remove Blue Hyperlink)  Patient will be independent with initial HEP  Baseline: Goal status: INITIAL  2.  Pain report to be no greater than 4/10  Baseline:  Goal status: INITIAL   LONG TERM GOALS: Target date: 09/13/2021  (Remove Blue Hyperlink)  Patient to be independent with advanced HEP  Baseline:  Goal status: INITIAL  2.  Patient to report pain no greater than 2/10  Baseline:  Goal status: INITIAL  3.  FOTO to be 73 Baseline:  Goal status: INITIAL  4.  Patient to be able to do his normal work duties without pain. Baseline:  Goal status: INITIAL  5.  Patient to be able to do side lying shoulder ER on right without fatigue with 4 lb Baseline:  Goal status: INITIAL    PLAN: PT FREQUENCY: 1x/week  PT DURATION: 8 weeks  PLANNED INTERVENTIONS: Therapeutic exercises, Therapeutic activity, Neuromuscular re-education, Patient/Family education, Joint mobilization, Orthotic/Fit training, Dry Needling, Electrical stimulation, Cryotherapy, Moist heat, Splintting, Taping, Ultrasound, Ionotophoresis '4mg'$ /ml Dexamethasone, Manual therapy, and Re-evaluation  PLAN FOR NEXT SESSION: Review HEP and assess effectiveness, manual techniques, progress shoulder exercises and wrist strengthening, ionto if indicated, UBE.   Anderson Malta B. Mead Slane, PT 07/19/21 9:31 AM

## 2021-08-02 ENCOUNTER — Ambulatory Visit: Payer: Managed Care, Other (non HMO)

## 2021-08-08 NOTE — Therapy (Addendum)
OUTPATIENT PHYSICAL THERAPY TREATMENT NOTE   Patient Name: Rick Harris MRN: [id number] DOB:06/25/1965, 56 y.o., male Today's Date: 08/09/2021  PCP: Betty Martinique, MD  REFERRING PROVIDER: Betty Martinique, MD  END OF SESSION:   PT End of Session - 08/09/21 1020     Visit Number 2    Date for PT Re-Evaluation 09/13/21    Authorization Type [employer]    PT Start Time 0930    PT Stop Time 1324    PT Time Calculation (min) 45 min    Activity Tolerance Patient tolerated treatment well    Behavior During Therapy [facility] for tasks assessed/performed             Past Medical History:  Diagnosis Date   Allergy    seasonal allergies   Asthma    uses inhaler when needed   Frequent headaches    Heart murmur    childhood   Hypercholesteremia    on meds   Hypertension    on meds   OSA (obstructive sleep apnea)    mild-mod (11/2013), declined CPAP to work on weight reduction   Sleep apnea    not have a CPAP machine   Past Surgical History:  Procedure Laterality Date   APPENDECTOMY  2009   NASAL SINUS SURGERY  04/1997   WISDOM TOOTH EXTRACTION  2018   Patient Active Problem List   Diagnosis Date Noted   Hypertension, essential, benign 05/05/2018   Low back pain 05/05/2018   Prediabetes 05/05/2018   Allergic rhinitis 03/11/2017   Asthma in adult, mild intermittent, uncomplicated 40/11/2723   Encounter for general adult medical examination with abnormal findings 01/07/2016   Class 1 obesity with body mass index (BMI) of 33.0 to 33.9 in adult 12/05/2014   Microcytic anemia 11/24/2013   OSA (obstructive sleep apnea)    Bradycardia    Hyperlipidemia     REFERRING DIAG: M77.11 (ICD-10-CM) - Lateral epicondylitis of right elbow   THERAPY DIAG:  Stiffness of right elbow, not elsewhere classified  Pain in right elbow  Cramp and spasm  Muscle weakness (generalized)  Rationale for Evaluation and Treatment Rehabilitation  PERTINENT HISTORY: None  PRECAUTIONS:  None  SUBJECTIVE: Patient states that he has been having continued pain in his elbow with lifting, supination and repetitive movements. He reports some compliance with his HEP.   PAIN:  Are you having pain? Yes: NPRS scale: 0/10 Pain location: Right elbow Pain description: aching Aggravating factors: work Relieving factors: tylenol   OBJECTIVE:    DIAGNOSTIC FINDINGS:  na   PATIENT SURVEYS:  FOTO 57 goal is 39   COGNITION:           Overall cognitive status: Within functional limits for tasks assessed                                  SENSATION: WFL   POSTURE: Rounded shoulders, bilateral scapular protraction   UPPER EXTREMITY ROM:             All WNL   UPPER EXTREMITY MMT:            All generally 5/5 with exception of shoulder ER 4/5 and wrist extension 4-/5 with pain   SHOULDER SPECIAL TESTS:            Impingement tests: Hawkins/Kennedy impingement test: negative and Painful arc test: negative            SLAP  lesions: Biceps load test: negative            Instability tests: Apprehension test: negative            Rotator cuff assessment: Empty can test: negative            Biceps assessment: Speed's test: negative     PALPATION:  Tender at insertion of ECRB and common extensor tendons, trochlear and radial notches             TODAY'S TREATMENT:  [facility] Adult PT Treatment:                                                DATE: 08/09/2021 Therapeutic Exercise: UBE 67mn fwd/ bkwd Horizontal abduction GTB 10 x 2 sets.  Single Arm Serratus Punches in Supine with Dumbbell #2 Sup/Pronation with #6 dumbbell 10x  Seated Wrist flexion with 6# dumbbell 10 x Seated Eccentric Wrist Extension 6# dumbbell 10 x Prone Shoulder Row - Single Arm 6# dumbbell 10 x 2 sets Prone Shoulder Extension - Single Arm 6# dumbbell 10 x 2 sets Bicep curl 6# dumbbell 10 x 2 sets Manual Therapy: Muscle bending to ECRL  Trigger Point Dry-Needling  Treatment instructions: Expect mild to  moderate muscle soreness. S/S of pneumothorax if dry needled over a lung field, and to seek immediate medical attention should they occur. Patient verbalized understanding of these instructions and education. Patient Consent Given: Yes Education handout provided: No Muscles treated: ECRL Electrical stimulation performed: No Parameters: N/A Treatment response/outcome: Decreased tension noted.   Moist heat to R elbow for 7 min at end of session.   07/19/2021 Initial eval completed and initiated HEP     PATIENT EDUCATION: Education details: Initiated HEP Person educated: Patient and Spouse Education method: Explanation, Demonstration, TCorporate treasurercues, Verbal cues, and Handouts Education comprehension: verbalized understanding, returned demonstration, verbal cues required, and tactile cues required     HOME EXERCISE PROGRAM: Access Code: [email or web address] URL: [email or web address] Date: 07/19/2021 Prepared by: JCandyce Churn  Exercises - Standing Wrist Flexion Stretch  - 1 x daily - 7 x weekly - 1 sets - 10 reps - Seated Eccentric Wrist Extension  - 1 x daily - 7 x weekly - 3 sets - 10 reps - Prone Shoulder Extension - Single Arm  - 2 x daily - 7 x weekly - 2 sets - 10 reps - Prone Shoulder Row  - 2 x daily - 7 x weekly - 2 sets - 10 reps - Prone Single Arm Shoulder Horizontal Abduction with Scapular Retraction and Palm Down  - 2 x daily - 7 x weekly - 2 sets - 10 reps - Sidelying Shoulder External Rotation  - 2 x daily - 7 x weekly - 2 sets - 10 reps - Single Arm Serratus Punches in Supine with Dumbbell  - 2 x daily - 7 x weekly - 2 sets - 10 reps     ASSESSMENT:   CLINICAL IMPRESSION: Patient presents to first follow up appointment with no pain at rest. Session initiated UBE 2'/2' fwd and backward for warm up while taking subjective. Followed up with review of HEP with addition of strengthening exercise targeting parascapular, biceps, wrist extensors and flexors. Pt reports  increase in pain to 5/10 pain with fatigue. Educated pt on dry needling and side effects with consent given. Dry needling performed  to ECRL with decreased tension noted, no twitches noted. Encouraged pt to increase water intake and use heat at needed for pain tolerance. Followed up with muscle bending. Moist heat for 7 min at end of session with reports of pain reducing to 3/10. Pt will continue to benefit from skilled PT to address continued deficits.     OBJECTIVE IMPAIRMENTS decreased ROM, decreased strength, increased fascial restrictions, increased muscle spasms, impaired flexibility, impaired UE functional use, postural dysfunction, and pain.    ACTIVITY LIMITATIONS carrying, bathing, dressing, and hygiene/grooming   PARTICIPATION LIMITATIONS: meal prep, cleaning, laundry, occupation, and yard work   PERSONAL FACTORS Profession and Time since onset of injury/illness/exacerbation are also affecting patient's functional outcome.    REHAB POTENTIAL: Good   CLINICAL DECISION MAKING: Stable/uncomplicated   EVALUATION COMPLEXITY: Low     GOALS: Goals reviewed with patient? Yes   SHORT TERM GOALS: Target date: 08/16/2021  (Remove Blue Hyperlink)   Patient will be independent with initial HEP  Baseline: Goal status: INITIAL   2.  Pain report to be no greater than 4/10  Baseline:  Goal status: INITIAL     LONG TERM GOALS: Target date: 09/13/2021  (Remove Blue Hyperlink)   Patient to be independent with advanced HEP  Baseline:  Goal status: INITIAL   2.  Patient to report pain no greater than 2/10  Baseline:  Goal status: INITIAL   3.  FOTO to be 73 Baseline:  Goal status: INITIAL   4.  Patient to be able to do his normal work duties without pain. Baseline:  Goal status: INITIAL   5.  Patient to be able to do side lying shoulder ER on right without fatigue with 4 lb Baseline:  Goal status: INITIAL       PLAN: PT FREQUENCY: 1x/week   PT DURATION: 8 weeks   PLANNED  INTERVENTIONS: Therapeutic exercises, Therapeutic activity, Neuromuscular re-education, Patient/Family education, Joint mobilization, Orthotic/Fit training, Dry Needling, Electrical stimulation, Cryotherapy, Moist heat, Splintting, Taping, Ultrasound, Ionotophoresis '4mg'$ /ml Dexamethasone, Manual therapy, and Re-evaluation   PLAN FOR NEXT SESSION: Review HEP and assess effectiveness, manual techniques, progress shoulder exercises and wrist strengthening, ionto if indicated, UBE.   Lynden Ang, PT 08/09/2021, 11:08 AM

## 2021-08-09 ENCOUNTER — Ambulatory Visit: Payer: Managed Care, Other (non HMO) | Admitting: Physical Therapy

## 2021-08-09 ENCOUNTER — Encounter: Payer: Self-pay | Admitting: Physical Therapy

## 2021-08-09 DIAGNOSIS — M25621 Stiffness of right elbow, not elsewhere classified: Secondary | ICD-10-CM | POA: Diagnosis not present

## 2021-08-09 DIAGNOSIS — M6281 Muscle weakness (generalized): Secondary | ICD-10-CM

## 2021-08-09 DIAGNOSIS — M25521 Pain in right elbow: Secondary | ICD-10-CM

## 2021-08-09 DIAGNOSIS — R252 Cramp and spasm: Secondary | ICD-10-CM

## 2021-08-14 ENCOUNTER — Encounter: Payer: Self-pay | Admitting: Family Medicine

## 2021-08-14 ENCOUNTER — Ambulatory Visit (INDEPENDENT_AMBULATORY_CARE_PROVIDER_SITE_OTHER): Payer: Managed Care, Other (non HMO) | Admitting: Family Medicine

## 2021-08-14 VITALS — BP 118/88 | HR 60 | Temp 97.9°F | Ht 72.0 in | Wt 245.6 lb

## 2021-08-14 DIAGNOSIS — R000 Tachycardia, unspecified: Secondary | ICD-10-CM | POA: Diagnosis not present

## 2021-08-14 DIAGNOSIS — R002 Palpitations: Secondary | ICD-10-CM

## 2021-08-14 DIAGNOSIS — I10 Essential (primary) hypertension: Secondary | ICD-10-CM | POA: Diagnosis not present

## 2021-08-14 NOTE — Patient Instructions (Addendum)
Try to gradually scale back caffeine use  Reduce beer consumption to no more than 2/day  Stay well-hydrated with noncaffeine, nonalcohol beverages  Consider coronary calcium scan, as discussed  Continue to monitor blood pressure closely  Set up lab to check thyroid.     Consider home heart monitor if symptoms persist.

## 2021-08-14 NOTE — Progress Notes (Signed)
Established Patient Office Visit  Subjective   Patient ID: Rick Harris, male    DOB: Apr 24, 1965  Age: 56 y.o. MRN: [id number]  Chief Complaint  Patient presents with   Tachycardia    Patient complains of sudden onset of fast heart rate x1 day, initially occurred while sitting, states home BP reading was 166/94-pulse 60    HPI   Mr. Sistrunk has a history of hypertension, obstructive sleep apnea, prediabetes, hyperlipidemia.  He takes low-dose amlodipine 2.5 mg daily and Crestor 20 mg daily.  He states that he woke up around 350 this morning with some irregular rhythm and sensation of tachycardia or palpitation.  Blood pressure 166/94 and he states pulse is around 65.  He also recalls yesterday morning doing some roof repairs in the shade and had brief episodes of sensation of heart racing.  No syncope.  No chest pain.  Does apparently drink several glasses per day of unsweetened tea.  Also recently has been drinking about 8 beers per day.  No recent chest pressure.  No diaphoresis.  Does apparently have strong family history of coronary disease and several grandparents.  Both his parents died at an early age.  Past Medical History:  Diagnosis Date   Allergy    seasonal allergies   Asthma    uses inhaler when needed   Frequent headaches    Heart murmur    childhood   Hypercholesteremia    on meds   Hypertension    on meds   OSA (obstructive sleep apnea)    mild-mod (11/2013), declined CPAP to work on weight reduction   Sleep apnea    not have a CPAP machine   Past Surgical History:  Procedure Laterality Date   APPENDECTOMY  2009   NASAL SINUS SURGERY  04/1997   WISDOM TOOTH EXTRACTION  2018    reports that he has quit smoking. His smoking use included cigarettes. He smoked an average of .25 packs per day. He has never used smokeless tobacco. He reports current alcohol use of about 36.0 standard drinks of alcohol per week. He reports that he does not use drugs. family  history includes Alcohol abuse in his mother; Diabetes in his father; Glaucoma (age of onset: 70) in his maternal grandmother; Heart disease in his maternal grandmother; Hyperlipidemia in his father; Hypertension in his father; Lung cancer in his maternal uncle; Lung disease in his maternal grandfather; Stroke in his father. No Known Allergies  Review of Systems  Constitutional:  Negative for chills and fever.  Respiratory:  Negative for cough and shortness of breath.   Cardiovascular:  Positive for palpitations. Negative for chest pain and leg swelling.      Objective:     BP 118/88 (BP Location: Left Arm, Patient Position: Sitting, Cuff Size: Large)   Pulse 60   Temp 97.9 F (36.6 C) (Oral)   Ht 6' (1.829 m)   Wt 245 lb 9.6 oz (111.4 kg)   SpO2 98%   BMI 33.31 kg/m  BP Readings from Last 3 Encounters:  08/14/21 118/88  06/14/21 128/70  04/22/21 124/80   Wt Readings from Last 3 Encounters:  08/14/21 245 lb 9.6 oz (111.4 kg)  06/14/21 243 lb 8 oz (110.5 kg)  04/22/21 247 lb (112 kg)      Physical Exam Constitutional:      Appearance: He is well-developed.  HENT:     Right Ear: External ear normal.     Left Ear: External ear normal.  Eyes:     Pupils: Pupils are equal, round, and reactive to light.  Neck:     Thyroid: No thyromegaly.  Cardiovascular:     Rate and Rhythm: Normal rate and regular rhythm.  Pulmonary:     Effort: Pulmonary effort is normal. No respiratory distress.     Breath sounds: Normal breath sounds. No wheezing or rales.  Musculoskeletal:     Cervical back: Neck supple.     Right lower leg: No edema.     Left lower leg: No edema.  Neurological:     Mental Status: He is alert and oriented to person, place, and time.      No results found for any visits on 08/14/21.  Last CBC Lab Results  Component Value Date   WBC 6.4 12/21/2020   HGB 12.7 (L) 12/21/2020   HCT 40.7 12/21/2020   MCV 75.0 (L) 12/21/2020   RDW 14.4 12/21/2020   PLT  209.0 54/27/0623   Last metabolic panel Lab Results  Component Value Date   GLUCOSE 98 04/22/2021   NA 140 04/22/2021   K 4.6 04/22/2021   CL 104 04/22/2021   CO2 28 04/22/2021   BUN 13 04/22/2021   CREATININE 1.14 04/22/2021   CALCIUM 9.8 04/22/2021   PROT 7.5 04/22/2021   ALBUMIN 4.4 04/22/2021   BILITOT 0.4 04/22/2021   ALKPHOS 73 04/22/2021   AST 33 04/22/2021   ALT 44 04/22/2021   Last lipids Lab Results  Component Value Date   CHOL 159 04/22/2021   HDL 57.20 04/22/2021   LDLCALC 76 04/22/2021   LDLDIRECT 186.0 12/21/2020   TRIG 130.0 04/22/2021   CHOLHDL 3 04/22/2021   Last hemoglobin A1c Lab Results  Component Value Date   HGBA1C 6.1 04/22/2021   Last thyroid functions Lab Results  Component Value Date   TSH 5.27 (H) 08/24/2019      The 10-year ASCVD risk score (Arnett DK, et al., 2019) is: 13.1%    Assessment & Plan:   #1 recent subjective palpitations.  Pulse is regular at this time with normal rate. -Check EKG -Consider repeat TSH -Strongly recommend he try to scale back caffeine use and also needs to greatly reduce alcohol consumption -Consider event monitor if he continues to have intermittent symptoms -EKG shows sinus bradycardia with heart rate around 50 with no acute ST-T changes.  No significant changes compared with tracing March of this year.  #2 hypertension-initial readings earlier today at home are up but much improved at this time.  Continue amlodipine 2.5 mg daily -Strongly recommend scaling back alcohol as above and he agrees  #3 strong family history of coronary artery disease.  We did discuss possible coronary calcium score to further risk stratify and he does have some interest.  He will consider.  Can discuss further with primary  No follow-ups on file.    Carolann Littler, MD

## 2021-08-15 NOTE — Therapy (Signed)
OUTPATIENT PHYSICAL THERAPY TREATMENT NOTE   Patient Name: Rick Harris MRN: [id number] DOB:1965-05-06, 56 y.o., male Today's Date: 08/16/2021  PCP: Betty Martinique, MD  REFERRING PROVIDER: Betty Martinique, MD  END OF SESSION:   PT End of Session - 08/16/21 0859     Visit Number 3    Date for PT Re-Evaluation 09/13/21    Authorization Type [employer]    PT Start Time [phone]    PT Stop Time 1191    PT Time Calculation (min) 41 min    Activity Tolerance Patient tolerated treatment well    Behavior During Therapy [facility] for tasks assessed/performed              Past Medical History:  Diagnosis Date   Allergy    seasonal allergies   Asthma    uses inhaler when needed   Frequent headaches    Heart murmur    childhood   Hypercholesteremia    on meds   Hypertension    on meds   OSA (obstructive sleep apnea)    mild-mod (11/2013), declined CPAP to work on weight reduction   Sleep apnea    not have a CPAP machine   Past Surgical History:  Procedure Laterality Date   APPENDECTOMY  2009   NASAL SINUS SURGERY  04/1997   WISDOM TOOTH EXTRACTION  2018   Patient Active Problem List   Diagnosis Date Noted   Hypertension, essential, benign 05/05/2018   Low back pain 05/05/2018   Prediabetes 05/05/2018   Allergic rhinitis 03/11/2017   Asthma in adult, mild intermittent, uncomplicated 47/82/9562   Encounter for general adult medical examination with abnormal findings 01/07/2016   Class 1 obesity with body mass index (BMI) of 33.0 to 33.9 in adult 12/05/2014   Microcytic anemia 11/24/2013   OSA (obstructive sleep apnea)    Bradycardia    Hyperlipidemia     REFERRING DIAG: M77.11 (ICD-10-CM) - Lateral epicondylitis of right elbow   THERAPY DIAG:  Stiffness of right elbow, not elsewhere classified  Pain in right elbow  Cramp and spasm  Muscle weakness (generalized)  Rationale for Evaluation and Treatment Rehabilitation  PERTINENT HISTORY: None  PRECAUTIONS:  None  SUBJECTIVE: Patient states that he is in a lot of pain today due to shaking wasp spray's yesterday. He reports not being compliant with his HEP this week.   PAIN:  Are you having pain? Yes: NPRS scale: 6/10 Pain location: Right elbow Pain description: aching Aggravating factors: work Relieving factors: tylenol   OBJECTIVE:    DIAGNOSTIC FINDINGS:  na   PATIENT SURVEYS:  FOTO 57 goal is 83   COGNITION:           Overall cognitive status: Within functional limits for tasks assessed                                  SENSATION: WFL   POSTURE: Rounded shoulders, bilateral scapular protraction   UPPER EXTREMITY ROM:             All WNL   UPPER EXTREMITY MMT:            All generally 5/5 with exception of shoulder ER 4/5 and wrist extension 4-/5 with pain   SHOULDER SPECIAL TESTS:            Impingement tests: Hawkins/Kennedy impingement test: negative and Painful arc test: negative  SLAP lesions: Biceps load test: negative            Instability tests: Apprehension test: negative            Rotator cuff assessment: Empty can test: negative            Biceps assessment: Speed's test: negative     PALPATION:  Tender at insertion of ECRB and common extensor tendons, trochlear and radial notches             TODAY'S TREATMENT:  [facility] Adult PT Treatment:                                                DATE: 08/16/2021 Therapeutic Exercise: UBE 36mn fwd/ bkwd Horizontal abduction YTB 10 x 2 sets Rows YTB 10 x 2 sets Single Arm Serratus Punches in Supine with Dumbbell #2 Sup/Pronation with #6 dumbbell 10x  Seated Wrist flexion with 6# dumbbell 10 x Seated Eccentric Wrist Extension 6# dumbbell 10 x Prone Shoulder Extension - Single Arm 6# dumbbell 10 x 2 sets Bicep curl YTB 10 x 2 sets Manual Therapy: Muscle bending to ECRL  Trigger Point Dry-Needling  Treatment instructions: Expect mild to moderate muscle soreness. S/S of pneumothorax if dry needled over a  lung field, and to seek immediate medical attention should they occur. Patient verbalized understanding of these instructions and education. Patient Consent Given: Yes Education handout provided: No Muscles treated: ECRL Electrical stimulation performed: No Parameters: N/A Treatment response/outcome: Decreased tension noted.   Moist heat to R elbow for 7 min at end of session.    [facility]                                                DATE: 08/09/2021 Therapeutic Exercise: UBE 268m fwd/ bkwd Horizontal abduction GTB 10 x 2 sets.  Single Arm Serratus Punches in Supine with Dumbbell #1 Sup/Pronation with #1 dumbbell 10x  Seated Wrist radial deviation with YTB 10 x Seated Eccentric Wrist Extension  10 x Bicep curl YTB 10 x 2 sets  Manual Therapy: Muscle bending to ECRL  Trigger Point Dry-Needling  Treatment instructions: Expect mild to moderate muscle soreness. S/S of pneumothorax if dry needled over a lung field, and to seek immediate medical attention should they occur. Patient verbalized understanding of these instructions and education. Patient Consent Given: Yes Education handout provided: No Muscles treated: ECRL Electrical stimulation performed: No Parameters: N/A Treatment response/outcome: Decreased tension noted.   Moist heat to R elbow for 10 min at end of session.   07/19/2021 Initial eval completed and initiated HEP     PATIENT EDUCATION: Education details: Initiated HEP Person educated: Patient and Spouse Education method: Explanation, Demonstration, [employer] treasurerues, Verbal cues, and Handouts Education comprehension: verbalized understanding, returned demonstration, verbal cues required, and tactile cues required     HOME EXERCISE PROGRAM: Access Code: [email or web address] URL: [email or web address] Date: 07/19/2021 Prepared by: JeCandyce Churn Exercises - Standing Wrist Flexion Stretch  - 1 x daily - 7 x weekly - 1 sets - 10 reps -  Seated Eccentric Wrist Extension  - 1 x daily - 7 x weekly - 3 sets - 10 reps - Prone Shoulder Extension - Single  Arm  - 2 x daily - 7 x weekly - 2 sets - 10 reps - Prone Shoulder Row  - 2 x daily - 7 x weekly - 2 sets - 10 reps - Prone Single Arm Shoulder Horizontal Abduction with Scapular Retraction and Palm Down  - 2 x daily - 7 x weekly - 2 sets - 10 reps - Sidelying Shoulder External Rotation  - 2 x daily - 7 x weekly - 2 sets - 10 reps - Single Arm Serratus Punches in Supine with Dumbbell  - 2 x daily - 7 x weekly - 2 sets - 10 reps     ASSESSMENT:   CLINICAL IMPRESSION: Due to increased pain today, session focused on pain modulation and light resistance training with no increase in pain from baseline 6/10. Pt able to complete all tasks, with rest breaks between each exercise due to fatigue. Dry needling performed to [facility] with muscle twitches noted, but no adverse effects. Ended session with moist heat with improved symptoms reported.     OBJECTIVE IMPAIRMENTS decreased ROM, decreased strength, increased fascial restrictions, increased muscle spasms, impaired flexibility, impaired UE functional use, postural dysfunction, and pain.    ACTIVITY LIMITATIONS carrying, bathing, dressing, and hygiene/grooming   PARTICIPATION LIMITATIONS: meal prep, cleaning, laundry, occupation, and yard work   PERSONAL FACTORS Profession and Time since onset of injury/illness/exacerbation are also affecting patient's functional outcome.    REHAB POTENTIAL: Good   CLINICAL DECISION MAKING: Stable/uncomplicated   EVALUATION COMPLEXITY: Low     GOALS: Goals reviewed with patient? Yes   SHORT TERM GOALS: Target date: 08/16/2021  (Remove Blue Hyperlink)   Patient will be independent with initial HEP  Baseline: Goal status: INITIAL   2.  Pain report to be no greater than 4/10  Baseline:  Goal status: INITIAL     LONG TERM GOALS: Target date: 09/13/2021  (Remove Blue Hyperlink)   Patient to be  independent with advanced HEP  Baseline:  Goal status: INITIAL   2.  Patient to report pain no greater than 2/10  Baseline:  Goal status: INITIAL   3.  FOTO to be 73 Baseline:  Goal status: INITIAL   4.  Patient to be able to do his normal work duties without pain. Baseline:  Goal status: INITIAL   5.  Patient to be able to do side lying shoulder ER on right without fatigue with 4 lb Baseline:  Goal status: INITIAL       PLAN: PT FREQUENCY: 1x/week   PT DURATION: 8 weeks   PLANNED INTERVENTIONS: Therapeutic exercises, Therapeutic activity, Neuromuscular re-education, Patient/Family education, Joint mobilization, Orthotic/Fit training, Dry Needling, Electrical stimulation, Cryotherapy, Moist heat, Splintting, Taping, Ultrasound, Ionotophoresis '4mg'$ /ml Dexamethasone, Manual therapy, and Re-evaluation   PLAN FOR NEXT SESSION: Review HEP and assess effectiveness, manual techniques, progress shoulder exercises and wrist strengthening, ionto if indicated, UBE.   Lynden Ang, PT 08/16/2021, 9:49 AM

## 2021-08-16 ENCOUNTER — Ambulatory Visit: Payer: Managed Care, Other (non HMO) | Attending: Family Medicine | Admitting: Physical Therapy

## 2021-08-16 ENCOUNTER — Encounter: Payer: Self-pay | Admitting: Physical Therapy

## 2021-08-16 DIAGNOSIS — M25621 Stiffness of right elbow, not elsewhere classified: Secondary | ICD-10-CM | POA: Insufficient documentation

## 2021-08-16 DIAGNOSIS — M6281 Muscle weakness (generalized): Secondary | ICD-10-CM | POA: Insufficient documentation

## 2021-08-16 DIAGNOSIS — M25521 Pain in right elbow: Secondary | ICD-10-CM | POA: Diagnosis present

## 2021-08-16 DIAGNOSIS — R252 Cramp and spasm: Secondary | ICD-10-CM | POA: Insufficient documentation

## 2021-08-19 NOTE — Therapy (Addendum)
OUTPATIENT PHYSICAL THERAPY TREATMENT NOTE   Patient Name: Rick Harris MRN: [id number] DOB:1965-03-21, 56 y.o., male Today's Date: 08/23/2021  PCP: Betty Martinique, MD  REFERRING PROVIDER: Betty Martinique, MD  END OF SESSION:   PT End of Session - 08/23/21 0940     Visit Number 4    Date for PT Re-Evaluation 09/13/21    Authorization Type [employer]    PT Start Time [phone]    PT Stop Time 1025    PT Time Calculation (min) 48 min    Activity Tolerance Patient tolerated treatment well    Behavior During Therapy [facility] for tasks assessed/performed               Past Medical History:  Diagnosis Date   Allergy    seasonal allergies   Asthma    uses inhaler when needed   Frequent headaches    Heart murmur    childhood   Hypercholesteremia    on meds   Hypertension    on meds   OSA (obstructive sleep apnea)    mild-mod (11/2013), declined CPAP to work on weight reduction   Sleep apnea    not have a CPAP machine   Past Surgical History:  Procedure Laterality Date   APPENDECTOMY  2009   NASAL SINUS SURGERY  04/1997   WISDOM TOOTH EXTRACTION  2018   Patient Active Problem List   Diagnosis Date Noted   Hypertension, essential, benign 05/05/2018   Low back pain 05/05/2018   Prediabetes 05/05/2018   Allergic rhinitis 03/11/2017   Asthma in adult, mild intermittent, uncomplicated 11/91/4782   Encounter for general adult medical examination with abnormal findings 01/07/2016   Class 1 obesity with body mass index (BMI) of 33.0 to 33.9 in adult 12/05/2014   Microcytic anemia 11/24/2013   OSA (obstructive sleep apnea)    Bradycardia    Hyperlipidemia     REFERRING DIAG: M77.11 (ICD-10-CM) - Lateral epicondylitis of right elbow   THERAPY DIAG:  Stiffness of right elbow, not elsewhere classified  Pain in right elbow  Cramp and spasm  Muscle weakness (generalized)  Rationale for Evaluation and Treatment Rehabilitation  PERTINENT HISTORY: None  PRECAUTIONS:  None  SUBJECTIVE: Patient states that he has been shoveling asphalt all week, so is having increased pain in his forearm today, but not his elbow.    PAIN:  Are you having pain? Yes: NPRS scale: 6/10 Pain location: Right elbow Pain description: aching Aggravating factors: work Relieving factors: tylenol   OBJECTIVE:    DIAGNOSTIC FINDINGS:  na   PATIENT SURVEYS:  FOTO 57 goal is 25   COGNITION:           Overall cognitive status: Within functional limits for tasks assessed                                  SENSATION: WFL   POSTURE: Rounded shoulders, bilateral scapular protraction   UPPER EXTREMITY ROM:             All WNL   UPPER EXTREMITY MMT:            All generally 5/5 with exception of shoulder ER 4/5 and wrist extension 4-/5 with pain   SHOULDER SPECIAL TESTS:            Impingement tests: Hawkins/Kennedy impingement test: negative and Painful arc test: negative  SLAP lesions: Biceps load test: negative            Instability tests: Apprehension test: negative            Rotator cuff assessment: Empty can test: negative            Biceps assessment: Speed's test: negative     PALPATION:  Tender at insertion of ECRB and common extensor tendons, trochlear and radial notches             TODAY'S TREATMENT:  [facility] Adult PT Treatment:                                                DATE: 08/23/2021 Therapeutic Exercise: UBE 26mn fwd/ bkwd Horizontal abduction RTB 10 x 2 sets Seated Eccentric Wrist Extension RTB 2 x 10 Bicep curl RTB 10 x 2 sets Rows YTB 10 x 2 sets Sup/Pronation with #6 dumbbell 10x  Manual Therapy: STM to wrist extensors  Moist heat to R elbow for 7 min at end of session.  [facility]                                                DATE: 08/16/2021 Therapeutic Exercise: UBE 21058m fwd/ bkwd Horizontal abduction YTB 10 x 2 sets Rows YTB 10 x 2 sets Single Arm Serratus Punches in Supine with Dumbbell #2 Sup/Pronation  with #6 dumbbell 10x  Seated Wrist flexion with 6# dumbbell 10 x Seated Eccentric Wrist Extension 6# dumbbell 10 x Prone Shoulder Extension - Single Arm 6# dumbbell 10 x 2 sets Bicep curl YTB 10 x 2 sets Manual Therapy: Muscle bending to ECRL  Trigger Point Dry-Needling  Treatment instructions: Expect mild to moderate muscle soreness. S/S of pneumothorax if dry needled over a lung field, and to seek immediate medical attention should they occur. Patient verbalized understanding of these instructions and education. Patient Consent Given: Yes Education handout provided: No Muscles treated: ECRL Electrical stimulation performed: No Parameters: N/A Treatment response/outcome: Decreased tension noted.   Moist heat to R elbow for 7 min at end of session.    [facility]                                                DATE: 08/[facility]/2021 Therapeutic Exercise: UBE 58m96mfwd/ bkwd Horizontal abduction GTB 10 x 2 sets.  Single Arm Serratus Punches in Supine with Dumbbell #1 Sup/Pronation with #1 dumbbell 10x  Seated Wrist radial deviation with YTB 10 x Seated Eccentric Wrist Extension  10 x Bicep curl YTB 10 x 2 sets  Manual Therapy: Muscle bending to ECRL  Trigger Point Dry-Needling  Treatment instructions: Expect mild to moderate muscle soreness. S/S of pneumothorax if dry needled over a lung field, and to seek immediate medical attention should they occur. Patient verbalized understanding of these instructions and education. Patient Consent Given: Yes Education handout provided: No Muscles treated: ECRL Electrical stimulation performed: No Parameters: N/A Treatment response/outcome: Decreased tension noted.   Moist heat to R elbow for 10 min at end of session.  07/19/2021 Initial eval completed and initiated HEP     PATIENT EDUCATION: Education details: Initiated HEP Person educated: Patient and Spouse Education method: Explanation, Demonstration, Corporate treasurer cues,  Verbal cues, and Handouts Education comprehension: verbalized understanding, returned demonstration, verbal cues required, and tactile cues required     HOME EXERCISE PROGRAM: Access Code: [email or web address] URL: [email or web address] Date: 07/19/2021 Prepared by: Candyce Churn   Exercises - Standing Wrist Flexion Stretch  - 1 x daily - 7 x weekly - 1 sets - 10 reps - Seated Eccentric Wrist Extension  - 1 x daily - 7 x weekly - 3 sets - 10 reps - Prone Shoulder Extension - Single Arm  - 2 x daily - 7 x weekly - 2 sets - 10 reps - Prone Shoulder Row  - 2 x daily - 7 x weekly - 2 sets - 10 reps - Prone Single Arm Shoulder Horizontal Abduction with Scapular Retraction and Palm Down  - 2 x daily - 7 x weekly - 2 sets - 10 reps - Sidelying Shoulder External Rotation  - 2 x daily - 7 x weekly - 2 sets - 10 reps - Single Arm Serratus Punches in Supine with Dumbbell  - 2 x daily - 7 x weekly - 2 sets - 10 reps     ASSESSMENT:   CLINICAL IMPRESSION: Pt reports back to PT with improvements noted in ECRL after dry needling last session. Upon palpation muscles have increased extensibility with decreased trigger points noted. Pt with increased tenderness upon extensor digitorum, and extensor carpi ulnaris. Pt reporting 4/10 pain upon completion of STM today. Continued to work on strengthening of RTC and wrist extensors within pain free ranges. Pt tolerated session well with no increase in pain, but noted fatigue. Pt will continue to benefit from skilled PT to address continued deficits.     OBJECTIVE IMPAIRMENTS decreased ROM, decreased strength, increased fascial restrictions, increased muscle spasms, impaired flexibility, impaired UE functional use, postural dysfunction, and pain.    ACTIVITY LIMITATIONS carrying, bathing, dressing, and hygiene/grooming   PARTICIPATION LIMITATIONS: meal prep, cleaning, laundry, occupation, and yard work   PERSONAL FACTORS Profession and Time since onset of  injury/illness/exacerbation are also affecting patient's functional outcome.    REHAB POTENTIAL: Good   CLINICAL DECISION MAKING: Stable/uncomplicated   EVALUATION COMPLEXITY: Low     GOALS: Goals reviewed with patient? Yes   SHORT TERM GOALS: Target date: 08/16/2021  (Remove Blue Hyperlink)   Patient will be independent with initial HEP  Baseline: Goal status: INITIAL   2.  Pain report to be no greater than 4/10  Baseline:  Goal status: INITIAL     LONG TERM GOALS: Target date: 09/13/2021  (Remove Blue Hyperlink)   Patient to be independent with advanced HEP  Baseline:  Goal status: INITIAL   2.  Patient to report pain no greater than 2/10  Baseline:  Goal status: INITIAL   3.  FOTO to be 73 Baseline:  Goal status: INITIAL   4.  Patient to be able to do his normal work duties without pain. Baseline:  Goal status: INITIAL   5.  Patient to be able to do side lying shoulder ER on right without fatigue with 4 lb Baseline:  Goal status: INITIAL       PLAN: PT FREQUENCY: 1x/week   PT DURATION: 8 weeks   PLANNED INTERVENTIONS: Therapeutic exercises, Therapeutic activity, Neuromuscular re-education, Patient/Family education, Joint mobilization, Orthotic/Fit training, Dry Needling, Electrical stimulation, Cryotherapy, Moist heat, Splintting, Taping, Ultrasound, Ionotophoresis  $'4mg'K$ /ml Dexamethasone, Manual therapy, and Re-evaluation   PLAN FOR NEXT SESSION: Review HEP and assess effectiveness, manual techniques, progress shoulder exercises and wrist strengthening, ionto if indicated, UBE.   Lynden Ang, PT 08/23/2021, 10:16 AM

## 2021-08-23 ENCOUNTER — Ambulatory Visit: Payer: Managed Care, Other (non HMO) | Admitting: Physical Therapy

## 2021-08-23 ENCOUNTER — Encounter: Payer: Self-pay | Admitting: Physical Therapy

## 2021-08-23 DIAGNOSIS — M25521 Pain in right elbow: Secondary | ICD-10-CM

## 2021-08-23 DIAGNOSIS — M25621 Stiffness of right elbow, not elsewhere classified: Secondary | ICD-10-CM

## 2021-08-23 DIAGNOSIS — M6281 Muscle weakness (generalized): Secondary | ICD-10-CM

## 2021-08-23 DIAGNOSIS — R252 Cramp and spasm: Secondary | ICD-10-CM

## 2021-08-30 ENCOUNTER — Ambulatory Visit: Payer: Managed Care, Other (non HMO)

## 2021-09-06 ENCOUNTER — Ambulatory Visit: Payer: Managed Care, Other (non HMO)

## 2021-09-06 DIAGNOSIS — M25521 Pain in right elbow: Secondary | ICD-10-CM

## 2021-09-06 DIAGNOSIS — M25621 Stiffness of right elbow, not elsewhere classified: Secondary | ICD-10-CM | POA: Diagnosis not present

## 2021-09-06 DIAGNOSIS — R252 Cramp and spasm: Secondary | ICD-10-CM

## 2021-09-06 DIAGNOSIS — M6281 Muscle weakness (generalized): Secondary | ICD-10-CM

## 2021-09-06 NOTE — Therapy (Signed)
OUTPATIENT PHYSICAL THERAPY REASSESSMENT NOTE   Patient Name: Rick Harris MRN: [id number] DOB:1965/03/26, 56 y.o., male Today's Date: 09/06/2021  PCP: Betty Martinique, MD  REFERRING PROVIDER: Betty Martinique, MD  END OF SESSION:   PT End of Session - 09/06/21 0934     Visit Number 5    Date for PT Re-Evaluation 09/13/21    Authorization Type [employer]    PT Start Time 0935    PT Stop Time 1020    PT Time Calculation (min) 45 min    Activity Tolerance Patient tolerated treatment well    Behavior During Therapy [facility] for tasks assessed/performed               Past Medical History:  Diagnosis Date   Allergy    seasonal allergies   Asthma    uses inhaler when needed   Frequent headaches    Heart murmur    childhood   Hypercholesteremia    on meds   Hypertension    on meds   OSA (obstructive sleep apnea)    mild-mod (11/2013), declined CPAP to work on weight reduction   Sleep apnea    not have a CPAP machine   Past Surgical History:  Procedure Laterality Date   APPENDECTOMY  2009   NASAL SINUS SURGERY  04/1997   WISDOM TOOTH EXTRACTION  2018   Patient Active Problem List   Diagnosis Date Noted   Hypertension, essential, benign 05/05/2018   Low back pain 05/05/2018   Prediabetes 05/05/2018   Allergic rhinitis 03/11/2017   Asthma in adult, mild intermittent, uncomplicated 53/29/9242   Encounter for general adult medical examination with abnormal findings 01/07/2016   Class 1 obesity with body mass index (BMI) of 33.0 to 33.9 in adult 12/05/2014   Microcytic anemia 11/24/2013   OSA (obstructive sleep apnea)    Bradycardia    Hyperlipidemia     REFERRING DIAG: M77.11 (ICD-10-CM) - Lateral epicondylitis of right elbow   THERAPY DIAG:  Stiffness of right elbow, not elsewhere classified  Pain in right elbow  Cramp and spasm  Muscle weakness (generalized)  Rationale for Evaluation and Treatment Rehabilitation  PERTINENT HISTORY: None  PRECAUTIONS:  None  SUBJECTIVE: Patient states that he is a little better but still having discomfort due to a lot of shoveling in the past 2 weeks.      PAIN:  Are you having pain? Yes: NPRS scale: 1/10 Pain location: Right elbow Pain description: aching Aggravating factors: work Relieving factors: tylenol   OBJECTIVE:    DIAGNOSTIC FINDINGS:  na   PATIENT SURVEYS:  FOTO 57 goal is 21 FOTO next visit: patient on wait list for visit   COGNITION:           Overall cognitive status: Within functional limits for tasks assessed                                  SENSATION: WFL   POSTURE: Rounded shoulders, bilateral scapular protraction 09/06/21 (Reassessment): Patient continues to have weakness bilateral rhomboids and serratus as evidenced by scapular position with strengthening tasks.     UPPER EXTREMITY ROM:             All WNL   UPPER EXTREMITY MMT:            All generally 5/5 with exception of shoulder ER 4/5 and wrist extension 4-/5 with pain  09/06/21 (Reassessment): Shoulder ER still  4/5,  wrist extension still 4-/5 with decreased pain   SHOULDER SPECIAL TESTS:            Impingement tests: Hawkins/Kennedy impingement test: negative and Painful arc test: negative            SLAP lesions: Biceps load test: negative            Instability tests: Apprehension test: negative            Rotator cuff assessment: Empty can test: negative            Biceps assessment: Speed's test: negative     PALPATION:  Tender at insertion of ECRB and common extensor tendons, trochlear and radial notches 09/06/21 (Reassessment): Patient continues to have tenderness at the above, but this is not as severe             TODAY'S TREATMENT:   09/06/21 UBE x 5 min 2.5 fwd and 2.5 reverse Reassessment  3 way scapular stabilization right 4 D ball rolls x 20 each, red plyo ball right Prone shoulder extension, rows and horizontal abduction x 20 each with 5 lb dumbell (on horizontal abduction- instructed  patient to keep wrist flexed to avoid elbow irritation) Sidelying ER with 5 lb (with wrist flexed) Supine serratus punch with 5 lb (right) Manual cross friction massage to ECRB x 3 min Iontophoresis #1 dexamethasone patch x 6 hour wear time: patient educated on when to remove and any s/s of need to remove early. Educated patient on use of ice vs. Heat for pain control  TODAY'S TREATMENT:  [facility] Adult PT Treatment:                                                DATE: 08/23/2021 Therapeutic Exercise: UBE 38mn fwd/ bkwd Horizontal abduction RTB 10 x 2 sets Seated Eccentric Wrist Extension RTB 2 x 10 Bicep curl RTB 10 x 2 sets Rows YTB 10 x 2 sets Sup/Pronation with #6 dumbbell 10x  Manual Therapy: STM to wrist extensors  Moist heat to R elbow for 7 min at end of session.  [facility]                                                DATE: 08/16/2021 Therapeutic Exercise: UBE 273m fwd/ bkwd Horizontal abduction YTB 10 x 2 sets Rows YTB 10 x 2 sets Single Arm Serratus Punches in Supine with Dumbbell #2 Sup/Pronation with #6 dumbbell 10x  Seated Wrist flexion with 6# dumbbell 10 x Seated Eccentric Wrist Extension 6# dumbbell 10 x Prone Shoulder Extension - Single Arm 6# dumbbell 10 x 2 sets Bicep curl YTB 10 x 2 sets Manual Therapy: Muscle bending to ECRL  Trigger Point Dry-Needling  Treatment instructions: Expect mild to moderate muscle soreness. S/S of pneumothorax if dry needled over a lung field, and to seek immediate medical attention should they occur. Patient verbalized understanding of these instructions and education. Patient Consent Given: Yes Education handout provided: No Muscles treated: ECRL Electrical stimulation performed: No Parameters: N/A Treatment response/outcome: Decreased tension noted.   Moist heat to R elbow for 7 min at end of session.    [facility]  [facility]: 08/09/2021 Therapeutic  Exercise: UBE 67mn fwd/ bkwd Horizontal abduction GTB 10 x 2 sets.  Single Arm Serratus Punches in Supine with Dumbbell #1 Sup/Pronation with #1 dumbbell 10x  Seated Wrist radial deviation with YTB 10 x Seated Eccentric Wrist Extension  10 x Bicep curl YTB 10 x 2 sets  Manual Therapy: Muscle bending to ECRL  Trigger Point Dry-Needling  Treatment instructions: Expect mild to moderate muscle soreness. S/S of pneumothorax if dry needled over a lung field, and to seek immediate medical attention should they occur. Patient verbalized understanding of these instructions and education. Patient Consent Given: Yes Education handout provided: No Muscles treated: ECRL Electrical stimulation performed: No Parameters: N/A Treatment response/outcome: Decreased tension noted.   Moist heat to R elbow for 10 min at end of session.   07/19/2021 Initial eval completed and initiated HEP     PATIENT EDUCATION: Education details: Initiated HEP Person educated: Patient and Spouse Education method: Explanation, Demonstration, TCorporate treasurercues, Verbal cues, and Handouts Education comprehension: verbalized understanding, returned demonstration, verbal cues required, and tactile cues required     HOME EXERCISE PROGRAM: Access Code: [email or web address] URL: [email or web address] Date: 07/19/2021 Prepared by: JCandyce Churn  Exercises - Standing Wrist Flexion Stretch  - 1 x daily - 7 x weekly - 1 sets - 10 reps - Seated Eccentric Wrist Extension  - 1 x daily - 7 x weekly - 3 sets - 10 reps - Prone Shoulder Extension - Single Arm  - 2 x daily - 7 x weekly - 2 sets - 10 reps - Prone Shoulder Row  - 2 x daily - 7 x weekly - 2 sets - 10 reps - Prone Single Arm Shoulder Horizontal Abduction with Scapular Retraction and Palm Down  - 2 x daily - 7 x weekly - 2 sets - 10 reps - Sidelying Shoulder External Rotation  - 2 x daily - 7 x weekly - 2 sets - 10 reps - Single Arm Serratus Punches in Supine with  Dumbbell  - 2 x daily - 7 x weekly - 2 sets - 10 reps     ASSESSMENT:   CLINICAL IMPRESSION: Patient continues to have tenderness and discomfort at lateral elbow.  He has not obtained the tennis elbow brace that was suggested and has been shoveling at his job excessively this week.  He was able to complete all task today without pain but fatigued easily with scapular stabilization exercises.  He admits he struggles to get all of his exercises in due to busy schedule between work and family  . Pt will continue to benefit from skilled PT to address continued deficits.     OBJECTIVE IMPAIRMENTS decreased ROM, decreased strength, increased fascial restrictions, increased muscle spasms, impaired flexibility, impaired UE functional use, postural dysfunction, and pain.    ACTIVITY LIMITATIONS carrying, bathing, dressing, and hygiene/grooming   PARTICIPATION LIMITATIONS: meal prep, cleaning, laundry, occupation, and yard work   PERSONAL FACTORS Profession and Time since onset of injury/illness/exacerbation are also affecting patient's functional outcome.    REHAB POTENTIAL: Good   CLINICAL DECISION MAKING: Stable/uncomplicated   EVALUATION COMPLEXITY: Low     GOALS: Goals reviewed with patient? Yes   SHORT TERM GOALS: Target date: 08/16/2021  (Remove Blue Hyperlink)   Patient will be independent with initial HEP  Baseline: Goal status: MET   2.  Pain report to be no greater than 4/10  Baseline:  Goal status: MET     LONG TERM GOALS: Target date: 09/13/2021  (Remove  Blue Hyperlink)   Patient to be independent with advanced HEP  Baseline:  Goal status: Progressing   2.  Patient to report pain no greater than 2/10  Baseline:  Goal status: Progressing   3.  FOTO to be 73 Baseline:  Goal status: Progressing   4.  Patient to be able to do his normal work duties without pain. Baseline:  Goal status: Progressing   5.  Patient to be able to do side lying shoulder ER on right  without fatigue with 4 lb Baseline:  Goal status: Progressing       PLAN: PT FREQUENCY: Continue 1x/week   PT DURATION: 4 more weeks   PLANNED INTERVENTIONS: Therapeutic exercises, Therapeutic activity, Neuromuscular re-education, Patient/Family education, Joint mobilization, Orthotic/Fit training, Dry Needling, Electrical stimulation, Cryotherapy, Moist heat, Splintting, Taping, Ultrasound, Ionotophoresis 73m/ml Dexamethasone, Manual therapy, and Re-evaluation   PLAN FOR NEXT SESSION: dry needling, manual techniques, progress shoulder exercises and wrist strengthening, ionto #2, UBE.   JAnderson MaltaB. Selia Wareing, PT 09/06/21 5:24 PM  [address] SStroudsburgGBait[place] [address]Phone # [phone] [phone]

## 2021-09-20 ENCOUNTER — Ambulatory Visit: Payer: Managed Care, Other (non HMO) | Admitting: Physical Therapy

## 2021-10-04 ENCOUNTER — Encounter: Payer: Self-pay | Admitting: Family Medicine

## 2021-10-04 ENCOUNTER — Ambulatory Visit (INDEPENDENT_AMBULATORY_CARE_PROVIDER_SITE_OTHER): Payer: Managed Care, Other (non HMO) | Admitting: Family Medicine

## 2021-10-04 ENCOUNTER — Ambulatory Visit: Payer: Managed Care, Other (non HMO) | Admitting: Physical Therapy

## 2021-10-04 VITALS — BP 140/70 | HR 53 | Temp 98.0°F | Ht 72.0 in | Wt 255.8 lb

## 2021-10-04 DIAGNOSIS — M545 Low back pain, unspecified: Secondary | ICD-10-CM

## 2021-10-04 MED ORDER — METHOCARBAMOL 500 MG PO TABS: 500.0000 mg | ORAL_TABLET | Freq: Three times a day (TID) | ORAL | 0 refills | Status: DC | PRN

## 2021-10-04 MED ORDER — PREDNISONE 10 MG PO TABS: ORAL_TABLET | ORAL | 0 refills | Status: DC

## 2021-10-04 NOTE — Progress Notes (Signed)
Established Patient Office Visit  Subjective   Patient ID: Rick Harris, male    DOB: 03/08/1965  Age: 56 y.o. MRN: [id number]  Chief Complaint  Patient presents with   Back Pain    Patient complains of back pain, x5 days, Patient denies any known injury    Flank Pain    HPI   Patient seen with right lower lumbar back pain.  Onset around Saturday of last week.  Radiation into the right buttocks.  No known injury.  Denies any definite numbness but has had occasional tingling fourth and fifth toes of the right foot.  Worse with going from lying to supine or seated to standing.  Denies any past history of back difficulties.  No urine or stool incontinence.  Job is very physical with the city of [employer].  Does a lot of lifting and bending.  No specific injury though.  Past Medical History:  Diagnosis Date   Allergy    seasonal allergies   Asthma    uses inhaler when needed   Frequent headaches    Heart murmur    childhood   Hypercholesteremia    on meds   Hypertension    on meds   OSA (obstructive sleep apnea)    mild-mod (11/2013), declined CPAP to work on weight reduction   Sleep apnea    not have a CPAP machine   Past Surgical History:  Procedure Laterality Date   APPENDECTOMY  2009   NASAL SINUS SURGERY  04/1997   WISDOM TOOTH EXTRACTION  2018    reports that he has quit smoking. His smoking use included cigarettes. He smoked an average of .25 packs per day. He has never used smokeless tobacco. He reports current alcohol use of about 36.0 standard drinks of alcohol per week. He reports that he does not use drugs. family history includes Alcohol abuse in his mother; Diabetes in his father; Glaucoma (age of onset: 97) in his maternal grandmother; Heart disease in his maternal grandmother; Hyperlipidemia in his father; Hypertension in his father; Lung cancer in his maternal uncle; Lung disease in his maternal grandfather; Stroke in his father. No Known  Allergies  Review of Systems  Constitutional:  Negative for chills, fever and weight loss.  Genitourinary:  Negative for dysuria.  Musculoskeletal:  Positive for back pain.      Objective:     BP (!) 140/70 (BP Location: Left Arm, Patient Position: Sitting, Cuff Size: Large)   Pulse (!) 53   Temp 98 F (36.7 C) (Oral)   Ht 6' (1.829 m)   Wt 255 lb 12.8 oz (116 kg)   SpO2 98%   BMI 34.69 kg/m    Physical Exam Vitals reviewed.  Constitutional:      Appearance: Normal appearance.  Cardiovascular:     Rate and Rhythm: Normal rate and regular rhythm.  Musculoskeletal:     Comments: No spinal tenderness.  Mild tenderness right lower lumbar region.  Straight leg raise negative bilaterally.  Neurological:     Mental Status: He is alert.     Comments: Full strength lower extremities.  2+ reflexes knee and ankle bilaterally.      No results found for any visits on 10/04/21.    The 10-year ASCVD risk score (Arnett DK, et al., 2019) is: 18.5%    Assessment & Plan:   Right lumbar back pain.  Onset about 6 days ago.  Nonfocal neuro exam.  May have some nerve root impingement with  radiation into the buttock. -Recommend trial of prednisone over the next 8 or 9 days -Robaxin 500 mg every 8 hours as needed for muscle spasm.  Caution about sedation potential- -recommend some extension exercises -Consider physical therapy if not improving with the above of the next several days  No follow-ups on file.    Carolann Littler, MD

## 2021-10-11 ENCOUNTER — Ambulatory Visit: Payer: Managed Care, Other (non HMO) | Attending: Family Medicine | Admitting: Physical Therapy

## 2021-10-11 ENCOUNTER — Encounter: Payer: Self-pay | Admitting: Physical Therapy

## 2021-10-11 DIAGNOSIS — R252 Cramp and spasm: Secondary | ICD-10-CM | POA: Insufficient documentation

## 2021-10-11 DIAGNOSIS — M25621 Stiffness of right elbow, not elsewhere classified: Secondary | ICD-10-CM | POA: Insufficient documentation

## 2021-10-11 DIAGNOSIS — M25521 Pain in right elbow: Secondary | ICD-10-CM | POA: Insufficient documentation

## 2021-10-11 DIAGNOSIS — M6281 Muscle weakness (generalized): Secondary | ICD-10-CM | POA: Diagnosis present

## 2021-10-11 NOTE — Therapy (Addendum)
OUTPATIENT PHYSICAL THERAPY REASSESSMENT NOTE   Patient Name: Rick Harris MRN: [id number] DOB:1965-06-05, 56 y.o., male Today's Date: 10/11/2021  PCP: Betty Martinique, MD  REFERRING PROVIDER: Betty Martinique, MD  END OF SESSION:   PT End of Session - 10/11/21 0933     Visit Number 6    Date for PT Re-Evaluation 10/11/21    Authorization Type [employer]    PT Start Time 0933    PT Stop Time 1000    PT Time Calculation (min) 27 min    Activity Tolerance Patient tolerated treatment well    Behavior During Therapy [facility] for tasks assessed/performed               Past Medical History:  Diagnosis Date   Allergy    seasonal allergies   Asthma    uses inhaler when needed   Frequent headaches    Heart murmur    childhood   Hypercholesteremia    on meds   Hypertension    on meds   OSA (obstructive sleep apnea)    mild-mod (11/2013), declined CPAP to work on weight reduction   Sleep apnea    not have a CPAP machine   Past Surgical History:  Procedure Laterality Date   APPENDECTOMY  2009   NASAL SINUS SURGERY  04/1997   WISDOM TOOTH EXTRACTION  2018   Patient Active Problem List   Diagnosis Date Noted   Hypertension, essential, benign 05/05/2018   Low back pain 05/05/2018   Prediabetes 05/05/2018   Allergic rhinitis 03/11/2017   Asthma in adult, mild intermittent, uncomplicated 95/28/4132   Encounter for general adult medical examination with abnormal findings 01/07/2016   Class 1 obesity with body mass index (BMI) of 33.0 to 33.9 in adult 12/05/2014   Microcytic anemia 11/24/2013   OSA (obstructive sleep apnea)    Bradycardia    Hyperlipidemia     REFERRING DIAG: M77.11 (ICD-10-CM) - Lateral epicondylitis of right elbow   THERAPY DIAG:  Stiffness of right elbow, not elsewhere classified  Pain in right elbow  Cramp and spasm  Muscle weakness (generalized)  Rationale for Evaluation and Treatment Rehabilitation  PERTINENT HISTORY: None  PRECAUTIONS:  None  SUBJECTIVE: 80% improved. Ok to DC today.    PAIN:  Are you having pain? Yes: NPRS scale: 1/10 Pain location: Right elbow Pain description: aching Aggravating factors: work Relieving factors: tylenol   OBJECTIVE:    DIAGNOSTIC FINDINGS:  na   PATIENT SURVEYS:  FOTO 57 goal is 9 FOTO next visit: patient on wait list for visit 10/11/21: FOTO 88   COGNITION:           Overall cognitive status: Within functional limits for tasks assessed                                  SENSATION: WFL   POSTURE: Rounded shoulders, bilateral scapular protraction 09/06/21 (Reassessment): Patient continues to have weakness bilateral rhomboids and serratus as evidenced by scapular position with strengthening tasks.     UPPER EXTREMITY ROM:             All WNL   UPPER EXTREMITY MMT:            All generally 5/5 with exception of shoulder ER 4/5 and wrist extension 4-/5 with pain  09/06/21 (Reassessment): Shoulder ER still 4/5,  wrist extension still 4-/5 with decreased pain 10/11/21: Shoulder Rt 5/5 all, Rt wrist  extension 5/5 no pain   SHOULDER SPECIAL TESTS:            Impingement tests: Hawkins/Kennedy impingement test: negative and Painful arc test: negative            SLAP lesions: Biceps load test: negative            Instability tests: Apprehension test: negative            Rotator cuff assessment: Empty can test: negative            Biceps assessment: Speed's test: negative     PALPATION:  Tender at insertion of ECRB and common extensor tendons, trochlear and radial notches 09/06/21 (Reassessment): Patient continues to have tenderness at the above, but this is not as severe             TODAY'S TREATMENT:    10/11/21: MMT Review of HEp Review of Goals FOTO Answered pt questions  09/06/21 UBE x 5 min 2.5 fwd and 2.5 reverse Reassessment  3 way scapular stabilization right 4 D ball rolls x 20 each, red plyo ball right Prone shoulder extension, rows and horizontal abduction x  20 each with 5 lb dumbell (on horizontal abduction- instructed patient to keep wrist flexed to avoid elbow irritation) Sidelying ER with 5 lb (with wrist flexed) Supine serratus punch with 5 lb (right) Manual cross friction massage to ECRB x 3 min Iontophoresis #1 dexamethasone patch x 6 hour wear time: patient educated on when to remove and any s/s of need to remove early. Educated patient on use of ice vs. Heat for pain control  TODAY'S TREATMENT:  [facility] Adult PT Treatment:                                                DATE: 08/23/2021 Therapeutic Exercise: UBE 63mn fwd/ bkwd Horizontal abduction RTB 10 x 2 sets Seated Eccentric Wrist Extension RTB 2 x 10 Bicep curl RTB 10 x 2 sets Rows YTB 10 x 2 sets Sup/Pronation with #6 dumbbell 10x  Manual Therapy: STM to wrist extensors  Moist heat to R elbow for 7 min at end of session.  Moist heat to R elbow for 7 min at end of session.     07/19/2021 Initial eval completed and initiated HEP     PATIENT EDUCATION: Education details: Initiated HEP Person educated: Patient and Spouse Education method: Explanation, Demonstration, TCorporate treasurercues, Verbal cues, and Handouts Education comprehension: verbalized understanding, returned demonstration, verbal cues required, and tactile cues required     HOME EXERCISE PROGRAM: Access Code: [email or web address] URL: [email or web address] Date: 07/19/2021 Prepared by: JCandyce Churn  Exercises - Standing Wrist Flexion Stretch  - 1 x daily - 7 x weekly - 1 sets - 10 reps - Seated Eccentric Wrist Extension  - 1 x daily - 7 x weekly - 3 sets - 10 reps - Prone Shoulder Extension - Single Arm  - 2 x daily - 7 x weekly - 2 sets - 10 reps - Prone Shoulder Row  - 2 x daily - 7 x weekly - 2 sets - 10 reps - Prone Single Arm Shoulder Horizontal Abduction with Scapular Retraction and Palm Down  - 2 x daily - 7 x weekly - 2 sets - 10 reps - Sidelying Shoulder External Rotation  - 2 x daily - 7 x  weekly -  2 sets - 10 reps - Single Arm Serratus Punches in Supine with Dumbbell  - 2 x daily - 7 x weekly - 2 sets - 10 reps     ASSESSMENT:   CLINICAL IMPRESSION: Pt reports 80% (or more) improved since start of care. All goals met, pt satisfied with progress. Pt is independent in HEP and has purchased an elbow sleeve to give elbow support and compression during work day.    OBJECTIVE IMPAIRMENTS decreased ROM, decreased strength, increased fascial restrictions, increased muscle spasms, impaired flexibility, impaired UE functional use, postural dysfunction, and pain.    ACTIVITY LIMITATIONS carrying, bathing, dressing, and hygiene/grooming   PARTICIPATION LIMITATIONS: meal prep, cleaning, laundry, occupation, and yard work   PERSONAL FACTORS Profession and Time since onset of injury/illness/exacerbation are also affecting patient's functional outcome.    REHAB POTENTIAL: Good   CLINICAL DECISION MAKING: Stable/uncomplicated   EVALUATION COMPLEXITY: Low     GOALS: Goals reviewed with patient? Yes   SHORT TERM GOALS: Target date: 08/16/2021  (Remove Blue Hyperlink)   Patient will be independent with initial HEP  Baseline: Goal status: MET   2.  Pain report to be no greater than 4/10  Baseline:  Goal status: MET     LONG TERM GOALS: Target date: 09/13/2021  (Remove Blue Hyperlink)   Patient to be independent with advanced HEP  Baseline:  Goal status: 10/11/21 met goal   2.  Patient to report pain no greater than 2/10  Baseline:  Goal status: < 1/10 Goal met 10/11/21   3.  FOTO to be 73 Baseline:  Goal status: 88 Goal met 10/11/21   4.  Patient to be able to do his normal work duties without pain. Baseline:  Goal status: Goal met 10/11/21   5.  Patient to be able to do side lying shoulder ER on right without fatigue with 4 lb Baseline:  Goal status: 10/11/21 Goal met      PLAN: PT FREQUENCY: Continue 1x/week   PT DURATION: 4 more weeks   PLANNED INTERVENTIONS:  Therapeutic exercises, Therapeutic activity, Neuromuscular re-education, Patient/Family education, Joint mobilization, Orthotic/Fit training, Dry Needling, Electrical stimulation, Cryotherapy, Moist heat, Splintting, Taping, Ultrasound, Ionotophoresis 52m/ml Dexamethasone, Manual therapy, and Re-evaluation   PLAN FOR NEXT SESSION: DC  JMyrene Galas PTA 10/11/21 10:00 AM PHYSICAL THERAPY DISCHARGE SUMMARY  Visits from Start of Care: 6  Current functional level related to goals / functional outcomes: See above   Remaining deficits: See above   Education / Equipment: HEP   Patient agrees to discharge. Patient goals were partially met. Patient is being discharged due to meeting the stated rehab goals.  KSigurd Sos PT 04/03/22 3:55 PM   [facility] [address] SBellevueGR[place]ling [place] [address]Phone # [phone] [phone]

## 2021-12-14 ENCOUNTER — Other Ambulatory Visit: Payer: Self-pay | Admitting: Family Medicine

## 2021-12-14 DIAGNOSIS — J4520 Mild intermittent asthma, uncomplicated: Secondary | ICD-10-CM

## 2021-12-27 NOTE — Progress Notes (Unsigned)
HPI: Mr. BRODERICK FONSECA is a 56 y.o.male  with hx of prediabetes, HLD,HTN,asthma, and allergic rhinitis here today for his routine physical examination and follow up.  Last CPE: 12/21/20 He reports no new problems since the last visit. The patient denies any recent exacerbations of EID and is currently using albuterol for asthma management.  He has not been exercising regularly and has recently made dietary changes, including consuming cauliflower rice, and vegetables. For the past months he is eating more bread.  He reports sleeping for 4-5 hours per night due to his work schedule and reports experiencing sleepiness around 2 p.m. daily. He has been dx'ed with OSA, sleep study in 2018-2019, states that CPAP was recommended but he did not receive appt information.  Immunization History  Administered Date(s) Administered   PFIZER Comirnaty(Gray Top)Covid-19 Tri-Sucrose Vaccine 05/12/2019, 06/07/2019   Td 02/11/2008   Tdap 09/04/2014, 12/17/2020    Health Maintenance  Topic Date Due   COVID-19 Vaccine (3 - Pfizer series) 01/12/2022 (Originally 08/02/2019)   Zoster Vaccines- Shingrix (1 of 2) 04/01/2022 (Originally 09/25/2017)   INFLUENZA VACCINE  05/11/2022 (Originally 09/10/2021)   COLONOSCOPY (Pts 45-63yr Insurance coverage will need to be confirmed)  10/26/2022   Hepatitis C Screening  Completed   HIV Screening  Completed   HPV VACCINES  Aged Out    Last prostate ca screening: Nocturia x 1, stable for years, around 3 am. Lab Results  Component Value Date   PSA 0.43 12/21/2020   PSA 0.32 07/29/2013   -Negative for high alcohol intake or tobacco use.  Prediabetes: Negative for polydipsia,polyuria, or polyphagia. HgA1C 6.1 in 04/2021.  Asthma: He has been using albuterol more frequently in the past couple of months, requiring it in the morning and afternoon for wheezing, no cough or SOB.  Hypertension: He is taking amlodipine 2.5 mg daily, but reports occasional elevated blood  pressure readings at home, >140/90. HLD: He is taking rosuvastatin 20 mg daily.  Today he is c/o a new rash on his left anterior rib cage area that started last Monday and has been using a calming lotion to alleviate pruritus. Noted problem a day after working outdoors. He denies any new medications, lotions, or contact with potential allergens.  Review of Systems  Constitutional:  Positive for fatigue. Negative for activity change, appetite change and fever.  HENT:  Negative for mouth sores, nosebleeds, sore throat and trouble swallowing.   Eyes:  Negative for redness and visual disturbance.  Respiratory:  Positive for wheezing. Negative for cough and shortness of breath.   Cardiovascular:  Negative for chest pain, palpitations and leg swelling.  Gastrointestinal:  Negative for abdominal pain, blood in stool, nausea and vomiting.  Endocrine: Negative for cold intolerance and heat intolerance.  Genitourinary:  Negative for decreased urine volume, dysuria, genital sores, hematuria and testicular pain.  Musculoskeletal:  Negative for gait problem and myalgias.  Skin:  Negative for color change and rash.  Allergic/Immunologic: Positive for environmental allergies.  Neurological:  Negative for syncope, weakness and headaches.  Hematological:  Negative for adenopathy. Does not bruise/bleed easily.  Psychiatric/Behavioral:  Negative for confusion. The patient is not nervous/anxious.   All other systems reviewed and are negative.  Current Outpatient Medications on File Prior to Visit  Medication Sig Dispense Refill   albuterol (VENTOLIN HFA) 108 (90 Base) MCG/ACT inhaler INHALE 2 PUFFS INTO THE LUNGS EVERY 4 HOURS AS NEEDED FOR WHEEZING OR SHORTNESS OF BREATH. 8.5 each 3   rosuvastatin (CRESTOR) 20 MG tablet  Take 1 tablet (20 mg total) by mouth daily. 90 tablet 3   No current facility-administered medications on file prior to visit.   Past Medical History:  Diagnosis Date   Allergy     seasonal allergies   Asthma    uses inhaler when needed   Frequent headaches    Heart murmur    childhood   Hypercholesteremia    on meds   Hypertension    on meds   OSA (obstructive sleep apnea)    mild-mod (11/2013), declined CPAP to work on weight reduction   Sleep apnea    not have a CPAP machine    Past Surgical History:  Procedure Laterality Date   APPENDECTOMY  2009   NASAL SINUS SURGERY  04/1997   WISDOM TOOTH EXTRACTION  2018    No Known Allergies  Family History  Problem Relation Age of Onset   Alcohol abuse Mother    Hyperlipidemia Father        not biologic   Stroke Father        not biologic   Hypertension Father        not biologic   Diabetes Father        not biologic   Lung cancer Maternal Uncle    Heart disease Maternal Grandmother    Glaucoma Maternal Grandmother 63   Lung disease Maternal Grandfather    Colon polyps Neg Hx    Colon cancer Neg Hx    Esophageal cancer Neg Hx    Stomach cancer Neg Hx    Rectal cancer Neg Hx     Social History   Socioeconomic History   Marital status: Married    Spouse name: Not on file   Number of children: 4   Years of education: 14   Highest education level: Some college, no degree  Occupational History   Occupation: Art therapist  Tobacco Use   Smoking status: Former    Packs/day: 0.25    Types: Cigarettes   Smokeless tobacco: Never  Vaping Use   Vaping Use: Never used  Substance and Sexual Activity   Alcohol use: Yes    Alcohol/week: 36.0 standard drinks of alcohol    Types: 6 Standard drinks or equivalent, 30 Cans of beer per week   Drug use: No   Sexual activity: Not on file  Other Topics Concern   Not on file  Social History Narrative   Fun: Swim, drink beer    Social Determinants of Health   Financial Resource Strain: Medium Risk (03/04/2021)   Overall Financial Resource Strain (CARDIA)    Difficulty of Paying Living Expenses: Somewhat hard  Food Insecurity: Food Insecurity Present  (03/04/2021)   Hunger Vital Sign    Worried About Running Out of Food in the Last Year: Often true    Ran Out of Food in the Last Year: Often true  Transportation Needs: No Transportation Needs (03/04/2021)   PRAPARE - Hydrologist (Medical): No    Lack of Transportation (Non-Medical): No  Physical Activity: Sufficiently Active (03/04/2021)   Exercise Vital Sign    Days of Exercise per Week: 2 days    Minutes of Exercise per Session: 120 min  Stress: Stress Concern Present (03/04/2021)   [place]    Feeling of Stress : To some extent  Social Connections: Moderately Integrated (03/04/2021)   Social Connection and Isolation Panel [NHANES]    Frequency of Communication with  Friends and Family: Once a week    Frequency of Social Gatherings with Friends and Family: Once a week    Attends Religious Services: More than 4 times per year    Active Member of [employer] or Organizations: Yes    Attends Archivist Meetings: Not on file    Marital Status: Married    Vitals:   12/30/21 0703  BP: 138/80  Pulse: 67  Temp: 97.9 F (36.6 C)  SpO2: 97%   Body mass index is 34.74 kg/m.  Wt Readings from Last 3 Encounters:  12/30/21 256 lb 2 oz (116.2 kg)  10/04/21 255 lb 12.8 oz (116 kg)  08/14/21 245 lb 9.6 oz (111.4 kg)    Physical Exam Vitals and nursing note reviewed.  Constitutional:      General: He is not in acute distress.    Appearance: He is well-developed.  HENT:     Head: Normocephalic and atraumatic.     Right Ear: Tympanic membrane, ear canal and external ear normal.     Left Ear: Tympanic membrane and external ear normal.     Ears:     Comments: Left ear canal a whitish,pearl like papular lesion. Stable.    Mouth/Throat:     Mouth: Mucous membranes are moist.     Pharynx: Oropharynx is clear.  Eyes:     Extraocular Movements:     Right eye: Abnormal extraocular motion  (Right exophoria.) present.     Left eye: Normal extraocular motion.     Conjunctiva/sclera: Conjunctivae normal.     Pupils: Pupils are equal, round, and reactive to light.  Neck:     Thyroid: No thyromegaly.     Trachea: No tracheal deviation.  Cardiovascular:     Rate and Rhythm: Normal rate and regular rhythm.     Pulses:          Dorsalis pedis pulses are 2+ on the right side and 2+ on the left side.     Heart sounds: No murmur heard. Pulmonary:     Effort: Pulmonary effort is normal. No respiratory distress.     Breath sounds: Wheezing present. No rhonchi or rales.  Abdominal:     Palpations: Abdomen is soft. There is no hepatomegaly or mass.     Tenderness: There is no abdominal tenderness.  Genitourinary:    Comments: No concerns. Musculoskeletal:        General: No tenderness.     Cervical back: Normal range of motion.     Comments: No major deformities appreciated and no signs of synovitis.  Lymphadenopathy:     Cervical: No cervical adenopathy.     Upper Body:     Right upper body: No supraclavicular adenopathy.     Left upper body: No supraclavicular adenopathy.  Skin:    General: Skin is warm.     Findings: Rash present. No erythema.       Neurological:     Mental Status: He is alert and oriented to person, place, and time.     Cranial Nerves: No cranial nerve deficit.     Sensory: No sensory deficit.     Coordination: Coordination normal.     Gait: Gait normal.     Deep Tendon Reflexes:     Reflex Scores:      Bicep reflexes are 2+ on the right side and 2+ on the left side.      Patellar reflexes are 2+ on the right side and 2+ on the left  side. Psychiatric:        Mood and Affect: Mood and affect normal.   ASSESSMENT AND PLAN:  Mr.Kyzer was seen today for annual exam.  Diagnoses and all orders for this visit: Orders Placed This Encounter  Procedures   Comprehensive metabolic panel   Hemoglobin A1c   Lipid panel   Ambulatory referral to  Pulmonology   Lab Results  Component Value Date   HGBA1C 6.4 12/30/2021   Lab Results  Component Value Date   CHOL 138 12/30/2021   HDL 48.30 12/30/2021   LDLCALC 54 12/30/2021   LDLDIRECT 186.0 12/21/2020   TRIG 179.0 (H) 12/30/2021   CHOLHDL 3 12/30/2021   Lab Results  Component Value Date   CREATININE 1.17 12/30/2021   BUN 19 12/30/2021   NA 139 12/30/2021   K 4.5 12/30/2021   CL 105 12/30/2021   CO2 26 12/30/2021   Lab Results  Component Value Date   ALT 37 12/30/2021   AST 29 12/30/2021   ALKPHOS 71 12/30/2021   BILITOT 0.2 12/30/2021    Encounter for general adult medical examination with abnormal findings Assessment & Plan: Wheezing noted on auscultation and skin rash. We discussed the importance of regular physical activity and healthy diet for prevention of chronic illness and/or complications. Preventive guidelines reviewed. Vaccination: Declines flu vaccine, will hold on shingrix because oral prednisone started. Declines prostate cancer screening. Next CPE in a year.   Prediabetes Assessment & Plan: A healthy life style encouraged for diabetes prevention. Further recommendations according to HgA1C result.  Orders: -     Comprehensive metabolic panel; Future -     Hemoglobin A1c; Future  Mixed hyperlipidemia Assessment & Plan: Continue Crestor 20 mg daily and low fat diet. Further recommendations according to FLP results.  Orders: -     Comprehensive metabolic panel; Future -     Lipid panel; Future  OSA (obstructive sleep apnea) Assessment & Plan: Educated about possible complications if not appropriately treated. He is not sure if he is going to tolerate CPAP but agrees with pulmonologist evaluation. Wt loss will help.  Orders: -     Ambulatory referral to Pulmonology  Mild intermittent asthma with exacerbation Assessment & Plan: Not well controlled. Wheezing noted today, having frequent exacerbation. Sadvair 100-50 mcg bid started  today. Albuterol inh 2 puff every 6 hours for a week then as needed for wheezing or shortness of breath.  Predniosne 40 mg daily for 3-5 days. Some side effects of meds discussed, instructed to swish after Advair use. Instructed about warning signs.   Orders: -     Fluticasone-Salmeterol; Inhale 1 puff into the lungs 2 (two) times daily.  Dispense: 1 each; Refill: 3 -     predniSONE; Take 2 tablets (40 mg total) by mouth daily with breakfast for 5 days.  Dispense: 10 tablet; Refill: 0  Hypertension, essential, benign Assessment & Plan: BP is not well controlled. Possible complications of elevated BP discussed. Changes today:Amlodipine increased from 2.5 mg to 5 mg daily. Low salt diet. Continue monitoring BP at home. Follow-up in 2 months..  Orders: -     amLODIPine Besylate; Take 1 tablet (5 mg total) by mouth daily.  Dispense: 90 tablet; Refill: 0  Pruritic rash Assessment & Plan: Not clear etiology. Triamcinolone cream 0.1% bid, small amount for 14 days recommended. Side effects of topical steroid discussed. F/U as needed.  Orders: -     Triamcinolone Acetonide; Apply 1 Application topically 2 (two) times daily for  14 days.  Dispense: 30 g; Refill: 0  Palpitations Assessment & Plan: Not reported today. He had a future lab that was not done, TSH, so released to be done today.  Orders: -     TSH  Return in 2 months (on 03/01/2022) for chronic problems.  Master Touchet G. [place], MD  [facility]. [place] office.

## 2021-12-30 ENCOUNTER — Ambulatory Visit (INDEPENDENT_AMBULATORY_CARE_PROVIDER_SITE_OTHER): Payer: Managed Care, Other (non HMO) | Admitting: Family Medicine

## 2021-12-30 ENCOUNTER — Encounter: Payer: Self-pay | Admitting: Family Medicine

## 2021-12-30 VITALS — BP 138/80 | HR 67 | Temp 97.9°F | Ht 72.0 in | Wt 256.1 lb

## 2021-12-30 DIAGNOSIS — I10 Essential (primary) hypertension: Secondary | ICD-10-CM

## 2021-12-30 DIAGNOSIS — Z0001 Encounter for general adult medical examination with abnormal findings: Secondary | ICD-10-CM

## 2021-12-30 DIAGNOSIS — E782 Mixed hyperlipidemia: Secondary | ICD-10-CM | POA: Diagnosis not present

## 2021-12-30 DIAGNOSIS — R002 Palpitations: Secondary | ICD-10-CM | POA: Diagnosis not present

## 2021-12-30 DIAGNOSIS — Z0000 Encounter for general adult medical examination without abnormal findings: Secondary | ICD-10-CM

## 2021-12-30 DIAGNOSIS — R7303 Prediabetes: Secondary | ICD-10-CM

## 2021-12-30 DIAGNOSIS — Z125 Encounter for screening for malignant neoplasm of prostate: Secondary | ICD-10-CM

## 2021-12-30 DIAGNOSIS — J4521 Mild intermittent asthma with (acute) exacerbation: Secondary | ICD-10-CM | POA: Diagnosis not present

## 2021-12-30 DIAGNOSIS — L282 Other prurigo: Secondary | ICD-10-CM | POA: Diagnosis not present

## 2021-12-30 DIAGNOSIS — G4733 Obstructive sleep apnea (adult) (pediatric): Secondary | ICD-10-CM | POA: Diagnosis not present

## 2021-12-30 LAB — COMPREHENSIVE METABOLIC PANEL
ALT: 37 U/L (ref 0–53)
AST: 29 U/L (ref 0–37)
Albumin: 4 g/dL (ref 3.5–5.2)
Alkaline Phosphatase: 71 U/L (ref 39–117)
BUN: 19 mg/dL (ref 6–23)
CO2: 26 mEq/L (ref 19–32)
Calcium: 8.9 mg/dL (ref 8.4–10.5)
Chloride: 105 mEq/L (ref 96–112)
Creatinine, Ser: 1.17 mg/dL (ref 0.40–1.50)
GFR: 70.8 mL/min (ref >60.00)
Glucose, Bld: 87 mg/dL (ref 70–99)
Potassium: 4.5 mEq/L (ref 3.5–5.1)
Sodium: 139 mEq/L (ref 135–145)
Total Bilirubin: 0.2 mg/dL (ref 0.2–1.2)
Total Protein: 7.2 g/dL (ref 6.0–8.3)

## 2021-12-30 LAB — LIPID PANEL
Cholesterol: 138 mg/dL (ref 0–200)
HDL: 48.3 mg/dL (ref >39.00)
LDL Cholesterol: 54 mg/dL (ref 0–99)
NonHDL: 89.37
Total CHOL/HDL Ratio: 3
Triglycerides: 179 mg/dL — ABNORMAL HIGH (ref 0.0–149.0)
VLDL: 35.8 mg/dL (ref 0.0–40.0)

## 2021-12-30 LAB — HEMOGLOBIN A1C: Hgb A1c MFr Bld: 6.4 % (ref 4.6–6.5)

## 2021-12-30 LAB — TSH: TSH: 0.48 u[IU]/mL (ref 0.35–5.50)

## 2021-12-30 MED ORDER — AMLODIPINE BESYLATE 5 MG PO TABS: 5.0000 mg | ORAL_TABLET | Freq: Every day | ORAL | 0 refills | Status: DC

## 2021-12-30 MED ORDER — PREDNISONE 20 MG PO TABS: 40.0000 mg | ORAL_TABLET | Freq: Every day | ORAL | 0 refills | Status: DC

## 2021-12-30 MED ORDER — FLUTICASONE-SALMETEROL 100-50 MCG/ACT IN AEPB: 1.0000 | INHALATION_SPRAY | Freq: Two times a day (BID) | RESPIRATORY_TRACT | 3 refills | Status: DC

## 2021-12-30 MED ORDER — TRIAMCINOLONE ACETONIDE 0.1 % EX CREA: 1.0000 | TOPICAL_CREAM | Freq: Two times a day (BID) | CUTANEOUS | 0 refills | Status: AC

## 2021-12-30 MED ORDER — PREDNISONE 20 MG PO TABS: 40.0000 mg | ORAL_TABLET | Freq: Every day | ORAL | 0 refills | Status: AC

## 2021-12-30 NOTE — Assessment & Plan Note (Signed)
Not clear etiology. Triamcinolone cream 0.1% bid, small amount for 14 days recommended. Side effects of topical steroid discussed. F/U as needed.

## 2021-12-30 NOTE — Assessment & Plan Note (Signed)
Not reported today. He had a future lab that was not done, TSH, so released to be done today.

## 2021-12-30 NOTE — Assessment & Plan Note (Signed)
Educated about possible complications if not appropriately treated. He is not sure if he is going to tolerate CPAP but agrees with pulmonologist evaluation. Wt loss will help.

## 2021-12-30 NOTE — Assessment & Plan Note (Signed)
BP is not well controlled. Possible complications of elevated BP discussed. Changes today:Amlodipine increased from 2.5 mg to 5 mg daily. Low salt diet. Continue monitoring BP at home. Follow-up in 2 months.Marland Kitchen

## 2021-12-30 NOTE — Assessment & Plan Note (Signed)
A healthy life style encouraged for diabetes prevention. Further recommendations according to HgA1C result.

## 2021-12-30 NOTE — Assessment & Plan Note (Signed)
Not well controlled. Wheezing noted today, having frequent exacerbation. Sadvair 100-50 mcg bid started today. Albuterol inh 2 puff every 6 hours for a week then as needed for wheezing or shortness of breath.  Predniosne 40 mg daily for 3-5 days. Some side effects of meds discussed, instructed to swish after Advair use. Instructed about warning signs.

## 2021-12-30 NOTE — Patient Instructions (Addendum)
A few things to remember from today's visit:  Routine general medical examination at a health care facility  Prostate cancer screening  Prediabetes - Plan: Comprehensive metabolic panel, Hemoglobin A1c  Mixed hyperlipidemia - Plan: Comprehensive metabolic panel, Lipid panel  OSA (obstructive sleep apnea) - Plan: Ambulatory referral to Pulmonology  Mild intermittent asthma with exacerbation - Plan: predniSONE (DELTASONE) 20 MG tablet. Hypertension, essential, benign - Plan: amLODipine (NORVASC) 5 MG tablet  Pruritic rash - Plan: triamcinolone cream (KENALOG) 0.1 %  Amlodipine increased from 2.5 mg to 5 mg, you can take 2 tabs of 2.5 mg and new prescription will be 5 mg. Continue monitoring blood pressure regularly. For asthma, Prednisone with breakfast for 5 days and Albuterol inh 2 puff every 6 hours for a week then as needed for wheezing or shortness of breath.  Advair added today to use daily, swish after use. Appt with pulmonologist will be arranged to discuss sleep apnea.  Triamcinolone cream on rash 2 times daily, small amount, for up to 14 days.   If you need refills for medications you take chronically, please call your pharmacy. Do not use My Chart to request refills or for acute issues that need immediate attention. If you send a my chart message, it may take a few days to be addressed, specially if I am not in the office.  Please be sure medication list is accurate. If a new problem present, please set up appointment sooner than planned today.  Health Maintenance, Male Adopting a healthy lifestyle and getting preventive care are important in promoting health and wellness. Ask your health care provider about: The right schedule for you to have regular tests and exams. Things you can do on your own to prevent diseases and keep yourself healthy. What should I know about diet, weight, and exercise? Eat a healthy diet  Eat a diet that includes plenty of vegetables, fruits,  low-fat dairy products, and lean protein. Do not eat a lot of foods that are high in solid fats, added sugars, or sodium. Maintain a healthy weight Body mass index (BMI) is a measurement that can be used to identify possible weight problems. It estimates body fat based on height and weight. Your health care provider can help determine your BMI and help you achieve or maintain a healthy weight. Get regular exercise Get regular exercise. This is one of the most important things you can do for your health. Most adults should: Exercise for at least 150 minutes each week. The exercise should increase your heart rate and make you sweat (moderate-intensity exercise). Do strengthening exercises at least twice a week. This is in addition to the moderate-intensity exercise. Spend less time sitting. Even light physical activity can be beneficial. Watch cholesterol and blood lipids Have your blood tested for lipids and cholesterol at 54 years of age, then have this test every 5 years. You may need to have your cholesterol levels checked more often if: Your lipid or cholesterol levels are high. You are older than 55 years of age. You are at high risk for heart disease. What should I know about cancer screening? Many types of cancers can be detected early and may often be prevented. Depending on your health history and family history, you may need to have cancer screening at various ages. This may include screening for: Colorectal cancer. Prostate cancer. Skin cancer. Lung cancer. What should I know about heart disease, diabetes, and high blood pressure? Blood pressure and heart disease High blood pressure causes  heart disease and increases the risk of stroke. This is more likely to develop in people who have high blood pressure readings or are overweight. Talk with your health care provider about your target blood pressure readings. Have your blood pressure checked: Every 3-5 years if you are 82-34  years of age. Every year if you are 24 years old or older. If you are between the ages of 77 and 17 and are a current or former smoker, ask your health care provider if you should have a one-time screening for abdominal aortic aneurysm (AAA). Diabetes Have regular diabetes screenings. This checks your fasting blood sugar level. Have the screening done: Once every three years after age 65 if you are at a normal weight and have a low risk for diabetes. More often and at a younger age if you are overweight or have a high risk for diabetes. What should I know about preventing infection? Hepatitis B If you have a higher risk for hepatitis B, you should be screened for this virus. Talk with your health care provider to find out if you are at risk for hepatitis B infection. Hepatitis C Blood testing is recommended for: Everyone born from [birth year] through 1963. Anyone with known risk factors for hepatitis C. Sexually transmitted infections (STIs) You should be screened each year for STIs, including gonorrhea and chlamydia, if: You are sexually active and are younger than 54 years of age. You are older than 54 years of age and your health care provider tells you that you are at risk for this type of infection. Your sexual activity has changed since you were last screened, and you are at increased risk for chlamydia or gonorrhea. Ask your health care provider if you are at risk. Ask your health care provider about whether you are at high risk for HIV. Your health care provider may recommend a prescription medicine to help prevent HIV infection. If you choose to take medicine to prevent HIV, you should first get tested for HIV. You should then be tested every 3 months for as long as you are taking the medicine. Follow these instructions at home: Alcohol use Do not drink alcohol if your health care provider tells you not to drink. If you drink alcohol: Limit how much you have to 0-2 drinks a day. Know how  much alcohol is in your drink. In the [place] one drink equals one 12 oz bottle of beer (355 mL), one 5 oz glass of wine (148 mL), or one 1 oz glass of hard liquor (44 mL). Lifestyle Do not use any products that contain nicotine or tobacco. These products include cigarettes, chewing tobacco, and vaping devices, such as e-cigarettes. If you need help quitting, ask your health care provider. Do not use street drugs. Do not share needles. Ask your health care provider for help if you need support or information about quitting drugs. General instructions Schedule regular health, dental, and eye exams. Stay current with your vaccines. Tell your health care provider if: You often feel depressed. You have ever been abused or do not feel safe at home. Summary Adopting a healthy lifestyle and getting preventive care are important in promoting health and wellness. Follow your health care provider's instructions about healthy diet, exercising, and getting tested or screened for diseases. Follow your health care provider's instructions on monitoring your cholesterol and blood pressure. This information is not intended to replace advice given to you by your health care provider. Make sure you discuss any questions you  have with your health care provider. Document Revised: 06/18/2020 Document Reviewed: 06/18/2020 Elsevier Patient Education  [place].

## 2021-12-30 NOTE — Assessment & Plan Note (Addendum)
Wheezing noted on auscultation and skin rash. We discussed the importance of regular physical activity and healthy diet for prevention of chronic illness and/or complications. Preventive guidelines reviewed. Vaccination: Declines flu vaccine, will hold on shingrix because oral prednisone started. Declines prostate cancer screening. Next CPE in a year.

## 2021-12-30 NOTE — Assessment & Plan Note (Signed)
Continue Crestor 20 mg daily and low fat diet. Further recommendations according to FLP results.

## 2022-01-04 ENCOUNTER — Other Ambulatory Visit: Payer: Self-pay | Admitting: Family Medicine

## 2022-01-04 DIAGNOSIS — I10 Essential (primary) hypertension: Secondary | ICD-10-CM

## 2022-01-04 DIAGNOSIS — E782 Mixed hyperlipidemia: Secondary | ICD-10-CM

## 2022-02-28 NOTE — Progress Notes (Signed)
HPI: [name] is a 57 y.o. male, who is here today for chronic disease management.  Last seen on 12/30/2021. Asthma: Last visit he was experiencing wheezing and cough. Advair 100-50 mcg bid was added, he has been using it consistently and no longer wakes up wheezing or having nigh cough. He has not needed to use Albuterol inhaler at all.  He has made dietary changes, such as reducing white rice and bread consumption and incorporating cauliflower rice into his diet.  He acknowledges that he has not been exercising lately.  He quit smoking a couple of months ago, having smoked occasionally for about six years. Hypertension: His blood pressure has improved since the increase in amlodipine dosage from 2.5 to 5 mg.  He has been monitoring his blood pressure at home, with a recent reading of 122/80. Negative for unusual or severe headache, visual changes, exertional chest pain, dyspnea,  focal weakness, or edema.  Lab Results  Component Value Date   CREATININE 1.17 12/30/2021   BUN 19 12/30/2021   NA 139 12/30/2021   K 4.5 12/30/2021   CL 105 12/30/2021   CO2 26 12/30/2021   Prediabetes: Negative for abdominal pain, nausea,vomiting, polydipsia,polyuria, or polyphagia.  Lab Results  Component Value Date   HGBA1C 6.4 12/30/2021   Review of Systems  Constitutional:  Negative for appetite change, chills and fever.  HENT:  Negative for mouth sores and sore throat.   Genitourinary:  Negative for decreased urine volume, dysuria and hematuria.  Skin:  Negative for rash.  Neurological:  Negative for syncope and facial asymmetry.  See other pertinent positives and negatives in HPI.  Current Outpatient Medications on File Prior to Visit  Medication Sig Dispense Refill   albuterol (VENTOLIN HFA) 108 (90 Base) MCG/ACT inhaler INHALE 2 PUFFS INTO THE LUNGS EVERY 4 HOURS AS NEEDED FOR WHEEZING OR SHORTNESS OF BREATH. 8.5 each 3   amLODipine (NORVASC) 5 MG tablet TAKE 1 TABLET (5 MG  TOTAL) BY MOUTH DAILY. 90 tablet 0   rosuvastatin (CRESTOR) 20 MG tablet TAKE 1 TABLET BY MOUTH EVERY DAY 90 tablet 3   No current facility-administered medications on file prior to visit.   Past Medical History:  Diagnosis Date   Allergy    seasonal allergies   Asthma    uses inhaler when needed   Frequent headaches    Heart murmur    childhood   Hypercholesteremia    on meds   Hypertension    on meds   OSA (obstructive sleep apnea)    mild-mod (11/2013), declined CPAP to work on weight reduction   Sleep apnea    not have a CPAP machine   No Known Allergies  Social History   Socioeconomic History   Marital status: Married    Spouse name: Not on file   Number of children: 4   Years of education: 14   Highest education level: Some college, no degree  Occupational History   Occupation: Art therapist  Tobacco Use   Smoking status: Former    Packs/day: 0.25    Years: 6.00    Total pack years: 1.50    Types: Cigarettes    Start date: 2016    Quit date: 11/2021    Years since quitting: 0.3   Smokeless tobacco: Never  Vaping Use   Vaping Use: Never used  Substance and Sexual Activity   Alcohol use: Yes    Alcohol/week: 36.0 standard drinks of alcohol    Types: 6 Standard  drinks or equivalent, 30 Cans of beer per week   Drug use: No   Sexual activity: Not on file  Other Topics Concern   Not on file  Social History Narrative   Fun: Swim, drink beer    Social Determinants of Health   Financial Resource Strain: Medium Risk (03/04/2021)   Overall Financial Resource Strain (CARDIA)    Difficulty of Paying Living Expenses: Somewhat hard  Food Insecurity: Food Insecurity Present (03/04/2021)   Hunger Vital Sign    Worried About Running Out of Food in the Last Year: Often true    Ran Out of Food in the Last Year: Often true  Transportation Needs: No Transportation Needs (03/04/2021)   PRAPARE - Hydrologist (Medical): No    Lack of  Transportation (Non-Medical): No  Physical Activity: Sufficiently Active (03/04/2021)   Exercise Vital Sign    Days of Exercise per Week: 2 days    Minutes of Exercise per Session: 120 min  Stress: Stress Concern Present (03/04/2021)   [place]    Feeling of Stress : To some extent  Social Connections: Moderately Integrated (03/04/2021)   Social Connection and Isolation Panel [NHANES]    Frequency of Communication with Friends and Family: Once a week    Frequency of Social Gatherings with Friends and Family: Once a week    Attends Religious Services: More than 4 times per year    Active Member of [employer] or Organizations: Yes    Attends Archivist Meetings: Not on file    Marital Status: Married   Vitals:   03/03/22 0708  BP: 124/80  Pulse: 64  Resp: 16  Temp: 98.5 F (36.9 C)  SpO2: 98%  Body mass index is 34.72 kg/m.  Physical Exam Vitals and nursing note reviewed.  Constitutional:      General: He is not in acute distress.    Appearance: He is well-developed.  HENT:     Head: Normocephalic and atraumatic.     Mouth/Throat:     Mouth: Mucous membranes are moist.     Pharynx: Oropharynx is clear.  Eyes:     Conjunctiva/sclera: Conjunctivae normal.  Cardiovascular:     Rate and Rhythm: Normal rate and regular rhythm.     Pulses:          Dorsalis pedis pulses are 2+ on the right side and 2+ on the left side.     Heart sounds: No murmur heard. Pulmonary:     Effort: Pulmonary effort is normal. No respiratory distress.     Breath sounds: Normal breath sounds.  Abdominal:     Palpations: Abdomen is soft. There is no hepatomegaly or mass.     Tenderness: There is no abdominal tenderness.  Lymphadenopathy:     Cervical: No cervical adenopathy.  Skin:    General: Skin is warm.     Findings: No erythema or rash.  Neurological:     Mental Status: He is alert and oriented to person, place, and  time.     Cranial Nerves: No cranial nerve deficit.     Gait: Gait normal.  Psychiatric:        Mood and Affect: Mood and affect normal.   ASSESSMENT AND PLAN:  Mr. Deitrich was seen today for medical management of chronic issues.  Diagnoses and all orders for this visit:  Asthma in adult, mild intermittent, uncomplicated Assessment & Plan: Problem has improved and  seems to be well controlled now. Continue Advair 10-50 mcg bid and Albuterol inh 2 puff qid prn. Did remind him to swish after Advair use. F/U in 6 months.  Orders: -     Fluticasone-Salmeterol; Inhale 1 puff into the lungs 2 (two) times daily.  Dispense: 60 each; Refill: 6  Hypertension, essential, benign Assessment & Plan: BP adequately controlled. Continue Amlodipine 5 mg daily and DASH/low salt diet to continue. Continue monitoring BP at home. Eye exam is current.   Prediabetes Assessment & Plan: HgA1C 6.4 in 12/2021. Encourage patient to continue making small, consistent dietary changes and increase physical activity. Schedule a follow-up appointment in six months.   Class 1 obesity due to excess calories with serious comorbidity and body mass index (BMI) of 33.0 to 33.9 in adult Assessment & Plan: Wt overall stable. Consistency with healthy diet and physical activity encouraged.    Return in about 6 months (around 09/01/2022) for chronic problems.  Monick Rena G. [place], MD  [facility] [place] office.

## 2022-03-03 ENCOUNTER — Encounter: Payer: Self-pay | Admitting: Family Medicine

## 2022-03-03 ENCOUNTER — Ambulatory Visit (INDEPENDENT_AMBULATORY_CARE_PROVIDER_SITE_OTHER): Payer: Managed Care, Other (non HMO) | Admitting: Family Medicine

## 2022-03-03 VITALS — BP 124/80 | HR 64 | Temp 98.5°F | Resp 16 | Ht 72.0 in | Wt 256.0 lb

## 2022-03-03 DIAGNOSIS — R7303 Prediabetes: Secondary | ICD-10-CM | POA: Diagnosis not present

## 2022-03-03 DIAGNOSIS — I10 Essential (primary) hypertension: Secondary | ICD-10-CM

## 2022-03-03 DIAGNOSIS — J4520 Mild intermittent asthma, uncomplicated: Secondary | ICD-10-CM

## 2022-03-03 DIAGNOSIS — E6609 Other obesity due to excess calories: Secondary | ICD-10-CM

## 2022-03-03 DIAGNOSIS — Z6833 Body mass index (BMI) 33.0-33.9, adult: Secondary | ICD-10-CM

## 2022-03-03 DIAGNOSIS — E782 Mixed hyperlipidemia: Secondary | ICD-10-CM

## 2022-03-03 MED ORDER — FLUTICASONE-SALMETEROL 100-50 MCG/ACT IN AEPB: 1.0000 | INHALATION_SPRAY | Freq: Two times a day (BID) | RESPIRATORY_TRACT | 6 refills | Status: DC

## 2022-03-03 NOTE — Assessment & Plan Note (Signed)
BP adequately controlled. Continue Amlodipine 5 mg daily and DASH/low salt diet to continue. Continue monitoring BP at home. Eye exam is current.

## 2022-03-03 NOTE — Assessment & Plan Note (Signed)
Wt overall stable. Consistency with healthy diet and physical activity encouraged.

## 2022-03-03 NOTE — Assessment & Plan Note (Signed)
Problem has improved and seems to be well controlled now. Continue Advair 10-50 mcg bid and Albuterol inh 2 puff qid prn. Did remind him to swish after Advair use. F/U in 6 months.

## 2022-03-03 NOTE — Patient Instructions (Addendum)
A few things to remember from today's visit:  Asthma in adult, mild intermittent, uncomplicated  Hypertension, essential, benign  Prediabetes  If you need refills for medications you take chronically, please call your pharmacy. Do not use My Chart to request refills or for acute issues that need immediate attention. If you send a my chart message, it may take a few days to be addressed, specially if I am not in the office.  No changes today. Try to be consistent with positive dietary changes and physical activity. In 6 months we can do labs.  Please be sure medication list is accurate. If a new problem present, please set up appointment sooner than planned today.

## 2022-03-03 NOTE — Assessment & Plan Note (Addendum)
HgA1C 6.4 in 12/2021. Encourage patient to continue making small, consistent dietary changes and increase physical activity. Schedule a follow-up appointment in six months.

## 2022-04-28 ENCOUNTER — Encounter: Payer: Self-pay | Admitting: Family Medicine

## 2022-04-28 NOTE — Progress Notes (Signed)
ACUTE VISIT Chief Complaint  Patient presents with   URI    Started Saturday, sore throat started Thursday.    HPI: Rick Harris is a 57 y.o. male with past medical history significant for OSA, asthma, hyperlipidemia, hypertension, and prediabetes here today with his wife complaining of 5-6 days of respiratory symptoms as described above. He began experiencing acute symptoms on Thursday, including a sore throat, frontal headache,rhinorrhea,nasal congestion,and cough.  Cough This is a new problem. Associated symptoms include chills, ear congestion, headaches, myalgias, nasal congestion, postnasal drip, rhinorrhea, a sore throat and wheezing. Pertinent negatives include no chest pain, ear pain, eye redness, fever, heartburn, hemoptysis or rash. He has tried a beta-agonist inhaler for the symptoms. His past medical history is significant for asthma and environmental allergies.   He reports wheezing and SOB, which was notably severe on Sunday night. Additionally, he had a temp of 99.83F yesterday.  He has been managing these symptoms with Tylenol for headaches, Mucinex D for congestion, and Albuterol inh.  He has run out of Advair.   Nausea and vomiting, with the last vomiting episode occurring around 2:00 AM on Sunday night/Monday morning. He experienced diarrhea yesterday but has not had any further episodes since then.  Negative for abdominal pain.   The sore throat has improved. He also reports "clogged" ears that popped upon yawning.   He has been monitoring his blood pressure at home, with readings around 124-125/80 when feeling well.  Review of Systems  Constitutional:  Positive for chills. Negative for fever.  HENT:  Positive for postnasal drip, rhinorrhea and sore throat. Negative for ear discharge and ear pain.   Eyes:  Negative for redness and visual disturbance.  Respiratory:  Positive for cough and wheezing. Negative for hemoptysis.   Cardiovascular:  Negative for  chest pain.  Gastrointestinal:  Negative for heartburn.  Genitourinary:  Negative for decreased urine volume, dysuria and hematuria.  Musculoskeletal:  Positive for myalgias.  Skin:  Negative for rash.  Allergic/Immunologic: Positive for environmental allergies.  Neurological:  Positive for headaches. Negative for syncope and weakness.  Psychiatric/Behavioral:  Negative for confusion.   See other pertinent positives and negatives in HPI.  Current Outpatient Medications on File Prior to Visit  Medication Sig Dispense Refill   amLODipine (NORVASC) 5 MG tablet TAKE 1 TABLET (5 MG TOTAL) BY MOUTH DAILY. 90 tablet 0   fluticasone-salmeterol (ADVAIR) 100-50 MCG/ACT AEPB Inhale 1 puff into the lungs 2 (two) times daily. 60 each 6   rosuvastatin (CRESTOR) 20 MG tablet TAKE 1 TABLET BY MOUTH EVERY DAY 90 tablet 3   No current facility-administered medications on file prior to visit.   Past Medical History:  Diagnosis Date   Allergy    seasonal allergies   Asthma    uses inhaler when needed   Frequent headaches    Heart murmur    childhood   Hypercholesteremia    on meds   Hypertension    on meds   OSA (obstructive sleep apnea)    mild-mod (11/2013), declined CPAP to work on weight reduction   Sleep apnea    not have a CPAP machine   No Known Allergies  Social History   Socioeconomic History   Marital status: Married    Spouse name: Not on file   Number of children: 4   Years of education: 14   Highest education level: Some college, no degree  Occupational History   Occupation: Art therapist  Tobacco Use   Smoking  status: Former    Packs/day: 0.25    Years: 6.00    Additional pack years: 0.00    Total pack years: 1.50    Types: Cigarettes    Start date: 2016    Quit date: 11/2021    Years since quitting: 0.4   Smokeless tobacco: Never  Vaping Use   Vaping Use: Never used  Substance and Sexual Activity   Alcohol use: Yes    Alcohol/week: 36.0 standard drinks of  alcohol    Types: 6 Standard drinks or equivalent, 30 Cans of beer per week   Drug use: No   Sexual activity: Not on file  Other Topics Concern   Not on file  Social History Narrative   Fun: Swim, drink beer    Social Determinants of Health   Financial Resource Strain: Medium Risk (03/04/2021)   Overall Financial Resource Strain (CARDIA)    Difficulty of Paying Living Expenses: Somewhat hard  Food Insecurity: Food Insecurity Present (03/04/2021)   Hunger Vital Sign    Worried About Running Out of Food in the Last Year: Often true    Ran Out of Food in the Last Year: Often true  Transportation Needs: No Transportation Needs (03/04/2021)   PRAPARE - Hydrologist (Medical): No    Lack of Transportation (Non-Medical): No  Physical Activity: Sufficiently Active (03/04/2021)   Exercise Vital Sign    Days of Exercise per Week: 2 days    Minutes of Exercise per Session: 120 min  Stress: Stress Concern Present (03/04/2021)   [place]    Feeling of Stress : To some extent  Social Connections: Moderately Integrated (03/04/2021)   Social Connection and Isolation Panel [NHANES]    Frequency of Communication with Friends and Family: Once a week    Frequency of Social Gatherings with Friends and Family: Once a week    Attends Religious Services: More than 4 times per year    Active Member of [employer] or Organizations: Yes    Attends Archivist Meetings: Not on file    Marital Status: Married   Vitals:   04/29/22 1206  BP: 128/84  Pulse: 70  Resp: 16  Temp: 98.4 F (36.9 C)  SpO2: 98%   Body mass index is 34.5 kg/m.  Physical Exam Vitals and nursing note reviewed.  Constitutional:      General: He is not in acute distress.    Appearance: He is well-developed. He is not ill-appearing.  HENT:     Head: Normocephalic and atraumatic.     Right Ear: Ear canal and external ear normal. A  middle ear effusion is present.     Left Ear: Tympanic membrane and external ear normal.     Nose: Congestion and rhinorrhea present.     Right Turbinates: Enlarged.     Left Turbinates: Enlarged.     Right Sinus: No maxillary sinus tenderness or frontal sinus tenderness.     Left Sinus: No maxillary sinus tenderness or frontal sinus tenderness.     Mouth/Throat:     Mouth: Mucous membranes are moist.     Pharynx: Oropharynx is clear.  Eyes:     Conjunctiva/sclera: Conjunctivae normal.  Cardiovascular:     Rate and Rhythm: Normal rate and regular rhythm.     Heart sounds: No murmur heard. Pulmonary:     Effort: Pulmonary effort is normal. No respiratory distress.     Breath sounds: Normal  breath sounds. No stridor.  Abdominal:     Palpations: Abdomen is soft. There is no mass.     Tenderness: There is no abdominal tenderness.  Lymphadenopathy:     Cervical: No cervical adenopathy.  Skin:    General: Skin is warm.     Findings: No erythema or rash.  Neurological:     Mental Status: He is alert and oriented to person, place, and time.  Psychiatric:        Mood and Affect: Mood and affect normal.   ASSESSMENT AND PLAN:  Rick Harris was seen for URI symptoms.  Asthma in adult, mild intermittent, uncomplicated No wheezing appreciated today but has had it intermittent since acute illness started. Albuterol inh 2 puff every 6 hours for a week then as needed for wheezing or shortness of breath.  If wheezing reoccurs Prednisone 40 mg daily for 3-5 days. Resume Advair 100-50 mcg bid, rinse after use. He has refills available at his pharmacy. Instructed about warning signs.  -     predniSONE; Take 2 tablets (40 mg total) by mouth daily with breakfast for 5 days.  Dispense: 10 tablet; Refill: 0 -     Albuterol Sulfate HFA; Inhale 2 puffs into the lungs every 4 (four) hours as needed for wheezing or shortness of breath.  Dispense: 8.5 each; Refill: 3  URI, acute Symptoms suggests a  viral etiology, so symptomatic treatment recommended. COVID 19 test today negative. Instructed to monitor for signs of complications, including new onset of fever among some, clearly instructed about warning signs. Auto inflation maneuvers to help with eustachian tube dysfunction right side.  F/U as needed.  Lesion of left external auditory canal Lesion has been there for a while now, asymptomatic and seems stable. Recommend ENT evaluation.  -     Ambulatory referral to ENT  Cough, unspecified type I also explained that cough and nasal congestion can last a few days and sometimes weeks. Benzonatate for cough management. Managing asthma exacerbation will also help.  -     POC COVID-19 BinaxNow -     Benzonatate; Take 2 capsules (200 mg total) by mouth 2 (two) times daily as needed for up to 10 days.  Dispense: 30 capsule; Refill: 0  Return if symptoms worsen or fail to improve, for keep next appointment.  Joud Pettinato G. [place], MD  [facility]. [place] office.

## 2022-04-29 ENCOUNTER — Encounter: Payer: Self-pay | Admitting: Family Medicine

## 2022-04-29 ENCOUNTER — Ambulatory Visit (INDEPENDENT_AMBULATORY_CARE_PROVIDER_SITE_OTHER): Payer: Managed Care, Other (non HMO) | Admitting: Family Medicine

## 2022-04-29 VITALS — BP 128/84 | HR 70 | Temp 98.4°F | Resp 16 | Ht 72.0 in | Wt 254.4 lb

## 2022-04-29 DIAGNOSIS — H6192 Disorder of left external ear, unspecified: Secondary | ICD-10-CM | POA: Diagnosis not present

## 2022-04-29 DIAGNOSIS — J4520 Mild intermittent asthma, uncomplicated: Secondary | ICD-10-CM

## 2022-04-29 DIAGNOSIS — J069 Acute upper respiratory infection, unspecified: Secondary | ICD-10-CM | POA: Diagnosis not present

## 2022-04-29 DIAGNOSIS — R059 Cough, unspecified: Secondary | ICD-10-CM

## 2022-04-29 LAB — POC COVID19 BINAXNOW: SARS Coronavirus 2 Ag: NEGATIVE

## 2022-04-29 MED ORDER — PREDNISONE 20 MG PO TABS: 40.0000 mg | ORAL_TABLET | Freq: Every day | ORAL | 0 refills | Status: AC

## 2022-04-29 MED ORDER — BENZONATATE 100 MG PO CAPS: 200.0000 mg | ORAL_CAPSULE | Freq: Two times a day (BID) | ORAL | 0 refills | Status: AC | PRN

## 2022-04-29 MED ORDER — ALBUTEROL SULFATE HFA 108 (90 BASE) MCG/ACT IN AERS: 2.0000 | INHALATION_SPRAY | RESPIRATORY_TRACT | 3 refills | Status: DC | PRN

## 2022-04-29 NOTE — Patient Instructions (Addendum)
A few things to remember from today's visit:  Cough, unspecified type - Plan: POC COVID-19, benzonatate (TESSALON) 100 MG capsule  Asthma in adult, mild intermittent, uncomplicated - Plan: predniSONE (DELTASONE) 20 MG tablet, albuterol (VENTOLIN HFA) 108 (90 Base) MCG/ACT inhaler  URI, acute  Lesion of left external auditory canal - Plan: Ambulatory referral to ENT Albuterol inh 2 puff every 6 hours for a week then as needed for wheezing or shortness of breath.  Prednisone to start if wheezing reoccurs.  If you need refills for medications you take chronically, please call your pharmacy. Do not use My Chart to request refills or for acute issues that need immediate attention. If you send a my chart message, it may take a few days to be addressed, specially if I am not in the office.  Please be sure medication list is accurate. If a new problem present, please set up appointment sooner than planned today.

## 2022-07-30 ENCOUNTER — Other Ambulatory Visit: Payer: Self-pay | Admitting: Family Medicine

## 2022-07-30 DIAGNOSIS — I10 Essential (primary) hypertension: Secondary | ICD-10-CM

## 2022-08-01 NOTE — Progress Notes (Deleted)
HPI: [name] is a 57 y.o. male, who is here today for chronic disease management.  Last seen on 04/29/22  *** Review of Systems See other pertinent positives and negatives in HPI.  Current Outpatient Medications on File Prior to Visit  Medication Sig Dispense Refill   albuterol (VENTOLIN HFA) 108 (90 Base) MCG/ACT inhaler Inhale 2 puffs into the lungs every 4 (four) hours as needed for wheezing or shortness of breath. 8.5 each 3   amLODipine (NORVASC) 5 MG tablet TAKE 1 TABLET (5 MG TOTAL) BY MOUTH DAILY. 90 tablet 0   fluticasone-salmeterol (ADVAIR) 100-50 MCG/ACT AEPB Inhale 1 puff into the lungs 2 (two) times daily. 60 each 6   rosuvastatin (CRESTOR) 20 MG tablet TAKE 1 TABLET BY MOUTH EVERY DAY 90 tablet 3   No current facility-administered medications on file prior to visit.    Past Medical History:  Diagnosis Date   Allergy    seasonal allergies   Asthma    uses inhaler when needed   Frequent headaches    Heart murmur    childhood   Hypercholesteremia    on meds   Hypertension    on meds   OSA (obstructive sleep apnea)    mild-mod (11/2013), declined CPAP to work on weight reduction   Sleep apnea    not have a CPAP machine   No Known Allergies  Social History   Socioeconomic History   Marital status: Married    Spouse name: Not on file   Number of children: 4   Years of education: 14   Highest education level: Some college, no degree  Occupational History   Occupation: Secondary school teacher  Tobacco Use   Smoking status: Former    Packs/day: 0.25    Years: 6.00    Additional pack years: 0.00    Total pack years: 1.50    Types: Cigarettes    Start date: 2016    Quit date: 11/2021    Years since quitting: 0.7   Smokeless tobacco: Never  Vaping Use   Vaping Use: Never used  Substance and Sexual Activity   Alcohol use: Yes    Alcohol/week: 36.0 standard drinks of alcohol    Types: 6 Standard drinks or equivalent, 30 Cans of beer per week    Drug use: No   Sexual activity: Not on file  Other Topics Concern   Not on file  Social History Narrative   Fun: Swim, drink beer    Social Determinants of Health   Financial Resource Strain: Medium Risk (03/04/2021)   Overall Financial Resource Strain (CARDIA)    Difficulty of Paying Living Expenses: Somewhat hard  Food Insecurity: Food Insecurity Present (03/04/2021)   Hunger Vital Sign    Worried About Running Out of Food in the Last Year: Often true    Ran Out of Food in the Last Year: Often true  Transportation Needs: No Transportation Needs (03/04/2021)   PRAPARE - Administrator, Civil Service (Medical): No    Lack of Transportation (Non-Medical): No  Physical Activity: Sufficiently Active (03/04/2021)   Exercise Vital Sign    Days of Exercise per Week: 2 days    Minutes of Exercise per Session: 120 min  Stress: Stress Concern Present (03/04/2021)   [employer] of Occupational Health - Occupational Stress Questionnaire    Feeling of Stress : To some extent  Social Connections: Moderately Integrated (03/04/2021)   Social Connection and Isolation Panel [NHANES]  Frequency of Communication with Friends and Family: Once a week    Frequency of Social Gatherings with Friends and Family: Once a week    Attends Religious Services: More than 4 times per year    Active Member of [employer] or Organizations: Yes    Attends Banker Meetings: Not on file    Marital Status: Married    There were no vitals filed for this visit. There is no height or weight on file to calculate BMI.  Physical Exam  ASSESSMENT AND PLAN:  There are no diagnoses linked to this encounter.  No orders of the defined types were placed in this encounter.   No problem-specific Assessment & Plan notes found for this encounter.   No follow-ups on file.  Betty G. [place], MD  [facility]. [place] office.

## 2022-08-04 ENCOUNTER — Ambulatory Visit: Payer: Managed Care, Other (non HMO) | Admitting: Family Medicine

## 2022-08-04 NOTE — Progress Notes (Unsigned)
HPI: Rick Harris is a 57 y.o. male, who is here today with his wife for chronic disease management. He was last seen on 04/29/22.  Asthma: He is on Advair 100-50 mcg 2 puff bid. He mentions using albuterol approximately 3-4 times a week for cough and wheezing, which is significant less that he was needing it before starting Advair.   OSA: He admits to not using his CPAP machine. He describes waking up around 3 AM and having difficulty returning to sleep. He also mentions occasional morning headaches, which he attributes to dietary choices, specifically high salt intake. He does not feel rested when he wakes up.  His home blood pressure readings fluctuate around He is currently taking amlodipine 5 mg daily .    He has been under some stress and discusses his struggle with managing his beer consumption,drinking up to 12 beers per day.  He has made some dietary changes, decreased carbs intake.  Home BP fluctuates between 145/83 to 145/75.  Has not been consistent with following low salt diet, soy sauce consumption. Negative for unusual or severe headache, visual changes, exertional chest pain, dyspnea,  focal weakness, or edema.  Lab Results  Component Value Date   CREATININE 1.17 12/30/2021   BUN 19 12/30/2021   NA 139 12/30/2021   K 4.5 12/30/2021   CL 105 12/30/2021   CO2 26 12/30/2021   Prediabetes: Negative for polydipsia,polyuria, or polyphagia.  Lab Results  Component Value Date   HGBA1C 6.4 12/30/2021   Review of Systems  Constitutional:  Positive for fatigue. Negative for activity change, appetite change, chills and fever.  HENT:  Negative for mouth sores and sore throat.   Gastrointestinal:  Negative for abdominal pain, nausea and vomiting.  Genitourinary:  Negative for decreased urine volume, dysuria and hematuria.  Skin:  Negative for rash.  Allergic/Immunologic: Positive for environmental allergies.  Neurological:  Negative for syncope and facial asymmetry.   See other pertinent positives and negatives in HPI.  Current Outpatient Medications on File Prior to Visit  Medication Sig Dispense Refill   albuterol (VENTOLIN HFA) 108 (90 Base) MCG/ACT inhaler Inhale 2 puffs into the lungs every 4 (four) hours as needed for wheezing or shortness of breath. 8.5 each 3   amLODipine (NORVASC) 5 MG tablet TAKE 1 TABLET (5 MG TOTAL) BY MOUTH DAILY. 90 tablet 0   fluticasone-salmeterol (ADVAIR) 100-50 MCG/ACT AEPB Inhale 1 puff into the lungs 2 (two) times daily. 60 each 6   rosuvastatin (CRESTOR) 20 MG tablet TAKE 1 TABLET BY MOUTH EVERY DAY 90 tablet 3   No current facility-administered medications on file prior to visit.   Past Medical History:  Diagnosis Date   Allergy    seasonal allergies   Asthma    uses inhaler when needed   Frequent headaches    Heart murmur    childhood   Hypercholesteremia    on meds   Hypertension    on meds   OSA (obstructive sleep apnea)    mild-mod (11/2013), declined CPAP to work on weight reduction   Sleep apnea    not have a CPAP machine   No Known Allergies  Social History   Socioeconomic History   Marital status: Married    Spouse name: Not on file   Number of children: 4   Years of education: 14   Highest education level: Some college, no degree  Occupational History   Occupation: Secondary school teacher  Tobacco Use   Smoking status: Former  Packs/day: 0.25    Years: 6.00    Additional pack years: 0.00    Total pack years: 1.50    Types: Cigarettes    Start date: 2016    Quit date: 11/2021    Years since quitting: 0.7   Smokeless tobacco: Never  Vaping Use   Vaping Use: Never used  Substance and Sexual Activity   Alcohol use: Yes    Alcohol/week: 36.0 standard drinks of alcohol    Types: 6 Standard drinks or equivalent, 30 Cans of beer per week   Drug use: No   Sexual activity: Not on file  Other Topics Concern   Not on file  Social History Narrative   Fun: Swim, drink beer    Social  Determinants of Health   Financial Resource Strain: Medium Risk (03/04/2021)   Overall Financial Resource Strain (CARDIA)    Difficulty of Paying Living Expenses: Somewhat hard  Food Insecurity: Food Insecurity Present (03/04/2021)   Hunger Vital Sign    Worried About Running Out of Food in the Last Year: Often true    Ran Out of Food in the Last Year: Often true  Transportation Needs: No Transportation Needs (03/04/2021)   PRAPARE - Administrator, Civil Service (Medical): No    Lack of Transportation (Non-Medical): No  Physical Activity: Sufficiently Active (03/04/2021)   Exercise Vital Sign    Days of Exercise per Week: 2 days    Minutes of Exercise per Session: 120 min  Stress: Stress Concern Present (03/04/2021)   [employer] of Occupational Health - Occupational Stress Questionnaire    Feeling of Stress : To some extent  Social Connections: Moderately Integrated (03/04/2021)   Social Connection and Isolation Panel [NHANES]    Frequency of Communication with Friends and Family: Once a week    Frequency of Social Gatherings with Friends and Family: Once a week    Attends Religious Services: More than 4 times per year    Active Member of [employer] or Organizations: Yes    Attends Banker Meetings: Not on file    Marital Status: Married   Vitals:   08/05/22 1430  BP: 132/80  Pulse: 64  Resp: 16  Temp: 98.3 F (36.8 C)  SpO2: 98%   Wt Readings from Last 3 Encounters:  08/05/22 256 lb 6 oz (116.3 kg)  04/29/22 254 lb 6 oz (115.4 kg)  03/03/22 256 lb (116.1 kg)   Body mass index is 34.77 kg/m.  Physical Exam Vitals and nursing note reviewed.  Constitutional:      General: He is not in acute distress.    Appearance: He is well-developed.  HENT:     Head: Normocephalic and atraumatic.     Nose:     Right Turbinates: Enlarged.     Left Turbinates: Enlarged.     Mouth/Throat:     Mouth: Mucous membranes are moist.     Pharynx: Oropharynx is  clear.  Eyes:     Conjunctiva/sclera: Conjunctivae normal.  Cardiovascular:     Rate and Rhythm: Normal rate and regular rhythm.     Pulses:          Dorsalis pedis pulses are 2+ on the right side and 2+ on the left side.     Heart sounds: No murmur heard. Pulmonary:     Effort: Pulmonary effort is normal. No respiratory distress.     Breath sounds: Normal breath sounds.  Abdominal:     Palpations: Abdomen is soft.  There is no hepatomegaly or mass.     Tenderness: There is no abdominal tenderness.  Lymphadenopathy:     Cervical: No cervical adenopathy.  Skin:    General: Skin is warm.     Findings: No erythema or rash.  Neurological:     Mental Status: He is alert and oriented to person, place, and time.     Cranial Nerves: No cranial nerve deficit.     Gait: Gait normal.  Psychiatric:        Mood and Affect: Mood and affect normal.    ASSESSMENT AND PLAN:  Rick Harris was seen today for medical management of chronic issues.  Lab Results  Component Value Date   HGBA1C 5.7 08/05/2022   Hypertension, essential, benign Assessment & Plan: He ahs had elevated BP's at home. He does not want to change Amlodipine dose or adding a new med. We discussed possible complications of HTN. Continue Amlodipine 5 mg daily. Low salt diet recommended, we discussed healthier options for soy sauce. Continue monitoring BP regularly. Recommend periodic eye exam. Follow up in 5 months, before if needed.   Prediabetes Assessment & Plan: HgA1C improved, it went from 6.4 to 5.7 today. Consistency with a healthy life style encouraged for diabetes prevention.  Orders: -     POCT glycosylated hemoglobin (Hb A1C)  OSA (obstructive sleep apnea) Assessment & Plan: We discussed possible complications if not appropriately treated. Strongly recommend wearing his CPAP. Follows with Dr Maple Hudson.   Asthma in adult, mild intermittent, uncomplicated Assessment & Plan: Improved but still having to  use Albuterol inh 3-4 times per week. He does not want to changes treatment for now. Wt loss will also help. F/U in 5 months.   Obesity, Class I, BMI 30.0-34.9 (see actual BMI) Assessment & Plan: Gained about 2 Lb since his last visit. He understands the benefits of wt loss as well as adverse effects of obesity. Consistency with healthy diet and physical activity encouraged. Decreasing beer intake will help.   I spent a total of 41 minutes in both face to face and non face to face activities for this visit on the date of this encounter. During this time history was obtained and documented, examination was performed, prior labs reviewed, and assessment/plan discussed.  Return in about 5 months (around 01/05/2023) for chronic problems, CPE, Labs.  Audryana Hockenberry G. [place], MD  [facility]. [place] office.

## 2022-08-05 ENCOUNTER — Encounter: Payer: Self-pay | Admitting: Family Medicine

## 2022-08-05 ENCOUNTER — Ambulatory Visit (INDEPENDENT_AMBULATORY_CARE_PROVIDER_SITE_OTHER): Payer: Managed Care, Other (non HMO) | Admitting: Family Medicine

## 2022-08-05 VITALS — BP 132/80 | HR 64 | Temp 98.3°F | Resp 16 | Ht 72.0 in | Wt 256.4 lb

## 2022-08-05 DIAGNOSIS — G4733 Obstructive sleep apnea (adult) (pediatric): Secondary | ICD-10-CM

## 2022-08-05 DIAGNOSIS — E669 Obesity, unspecified: Secondary | ICD-10-CM

## 2022-08-05 DIAGNOSIS — Z6834 Body mass index (BMI) 34.0-34.9, adult: Secondary | ICD-10-CM

## 2022-08-05 DIAGNOSIS — I10 Essential (primary) hypertension: Secondary | ICD-10-CM

## 2022-08-05 DIAGNOSIS — R7303 Prediabetes: Secondary | ICD-10-CM | POA: Diagnosis not present

## 2022-08-05 DIAGNOSIS — J4520 Mild intermittent asthma, uncomplicated: Secondary | ICD-10-CM | POA: Diagnosis not present

## 2022-08-05 LAB — POCT GLYCOSYLATED HEMOGLOBIN (HGB A1C): HbA1c, POC (prediabetic range): 5.7 % (ref 5.7–6.4)

## 2022-08-05 NOTE — Assessment & Plan Note (Signed)
Improved but still having to use Albuterol inh 3-4 times per week. He does not want to changes treatment for now. Wt loss will also help. F/U in 5 months.

## 2022-08-05 NOTE — Assessment & Plan Note (Addendum)
HgA1C improved, it went from 6.4 to 5.7 today. Consistency with a healthy life style encouraged for diabetes prevention.

## 2022-08-05 NOTE — Assessment & Plan Note (Addendum)
Gained about 2 Lb since his last visit. He understands the benefits of wt loss as well as adverse effects of obesity. Consistency with healthy diet and physical activity encouraged. Decreasing beer intake will help.

## 2022-08-05 NOTE — Patient Instructions (Addendum)
A few things to remember from today's visit:  Hypertension, essential, benign  Prediabetes - Plan: POC HgB A1c  OSA (obstructive sleep apnea)  Asthma in adult, mild intermittent, uncomplicated Limit beer intake to less than 2 and a few times per week instead daily. No changes today. Goal blood pressure is under or at 130/80.  If you need refills for medications you take chronically, please call your pharmacy. Do not use My Chart to request refills or for acute issues that need immediate attention. If you send a my chart message, it may take a few days to be addressed, specially if I am not in the office.  Please be sure medication list is accurate. If a new problem present, please set up appointment sooner than planned today.

## 2022-08-07 NOTE — Assessment & Plan Note (Signed)
He ahs had elevated BP's at home. He does not want to change Amlodipine dose or adding a new med. We discussed possible complications of HTN. Continue Amlodipine 5 mg daily. Low salt diet recommended, we discussed healthier options for soy sauce. Continue monitoring BP regularly. Recommend periodic eye exam. Follow up in 5 months, before if needed.

## 2022-08-07 NOTE — Assessment & Plan Note (Signed)
We discussed possible complications if not appropriately treated. Strongly recommend wearing his CPAP. Follows with Dr Maple Hudson.

## 2022-08-28 ENCOUNTER — Telehealth: Payer: Self-pay | Admitting: Family Medicine

## 2022-08-28 DIAGNOSIS — J4520 Mild intermittent asthma, uncomplicated: Secondary | ICD-10-CM

## 2022-08-28 NOTE — Telephone Encounter (Signed)
Prescription Request  08/28/2022  LOV: 08/05/2022  What is the name of the medication or equipment? albuterol (VENTOLIN HFA) 108 (90 Base) MCG/ACT inhaler , fluticasone-salmeterol (ADVAIR) 100-50 MCG/ACT AEPB   Have you contacted your pharmacy to request a refill? No . Pt has new pharm  Which pharmacy would you like this sent to?   [facility] - [place], [place] - [address] [place] [address] Phone: [phone] Fax: [phone]    Patient notified that their request is being sent to the clinical staff for review and that they should receive a response within 2 business days.   Please advise at Mobile [phone] (mobile)

## 2022-08-29 MED ORDER — FLUTICASONE-SALMETEROL 100-50 MCG/ACT IN AEPB: 1.0000 | INHALATION_SPRAY | Freq: Two times a day (BID) | RESPIRATORY_TRACT | 6 refills | Status: DC

## 2022-08-29 MED ORDER — ALBUTEROL SULFATE HFA 108 (90 BASE) MCG/ACT IN AERS: 2.0000 | INHALATION_SPRAY | RESPIRATORY_TRACT | 3 refills | Status: AC | PRN

## 2022-08-29 NOTE — Telephone Encounter (Signed)
Rx's sent in. °

## 2022-09-18 ENCOUNTER — Telehealth: Payer: Self-pay | Admitting: Family Medicine

## 2022-09-18 DIAGNOSIS — J4520 Mild intermittent asthma, uncomplicated: Secondary | ICD-10-CM

## 2022-09-18 NOTE — Telephone Encounter (Signed)
Prescription Request  09/18/2022  LOV: 08/05/2022  What is the name of the medication or equipment?  fluticasone-salmeterol (ADVAIR) 100-50 MCG/ACT AEPB  Have you contacted your pharmacy to request a refill? Yes   Which pharmacy would you like this sent to?   [facility] - [place], [place] - [address] [place] [address] Phone: [phone] Fax: [phone]    Patient notified that their request is being sent to the clinical staff for review and that they should receive a response within 2 business days.   Please advise at Mobile [phone] (mobile)

## 2022-09-19 MED ORDER — FLUTICASONE-SALMETEROL 100-50 MCG/ACT IN AEPB: 1.0000 | INHALATION_SPRAY | Freq: Two times a day (BID) | RESPIRATORY_TRACT | 6 refills | Status: DC

## 2022-09-19 NOTE — Telephone Encounter (Signed)
Follow up with pharmacy as per med list the advair was sent in on 08/29/2022.   Spoke to [facility]. She reports they didn't receive the rx only the albuterol inhaler on 08/29/2022.  Thank her for the update and we'll re-send the prescription.   Rx is sent.

## 2022-09-19 NOTE — Addendum Note (Signed)
Addended byVickii Chafe on: 09/19/2022 11:55 AM   Modules accepted: Orders

## 2022-10-16 ENCOUNTER — Encounter: Payer: Self-pay | Admitting: Gastroenterology

## 2022-10-30 ENCOUNTER — Telehealth: Payer: Self-pay | Admitting: Family Medicine

## 2022-10-30 DIAGNOSIS — I10 Essential (primary) hypertension: Secondary | ICD-10-CM

## 2022-10-30 NOTE — Telephone Encounter (Signed)
Prescription Request  10/30/2022  LOV: 08/05/2022  What is the name of the medication or equipment? amLODipine (NORVASC) 5 MG tablet  Have you contacted your pharmacy to request a refill? Yes   Which pharmacy would you like this sent to?   [facility] - [place], [place] - [address] [place] [address] Phone: [phone] Fax: [phone]    Patient notified that their request is being sent to the clinical staff for review and that they should receive a response within 2 business days.   Please advise at Mobile [phone] (mobile)

## 2022-10-31 MED ORDER — AMLODIPINE BESYLATE 5 MG PO TABS: 5.0000 mg | ORAL_TABLET | Freq: Every day | ORAL | 1 refills | Status: DC

## 2022-10-31 NOTE — Telephone Encounter (Signed)
Rx sent 

## 2022-10-31 NOTE — Addendum Note (Signed)
Addended by: Kathreen Devoid on: 10/31/2022 07:01 AM   Modules accepted: Orders

## 2022-11-14 ENCOUNTER — Other Ambulatory Visit: Payer: Self-pay | Admitting: Family Medicine

## 2022-11-14 DIAGNOSIS — I10 Essential (primary) hypertension: Secondary | ICD-10-CM

## 2022-12-01 ENCOUNTER — Telehealth: Payer: Self-pay | Admitting: Family Medicine

## 2022-12-01 NOTE — Telephone Encounter (Signed)
Pt need on our letter head that md is treating him for dm

## 2022-12-02 NOTE — Telephone Encounter (Signed)
Letter is up front, pt's spouse is aware and will let him know.

## 2022-12-31 NOTE — Progress Notes (Signed)
HPI: Rick Harris is a 57 y.o. male with a PMHx significant for HTN, OSA, HLD, asthma, OSA, and prediabetes, who is here today for chronic disease management.  Last seen on 08/05/2022  Hypertension:  Medications: Currently on amlodipine 5 mg daily.  BP readings at home: He has been checking daily at home. He says his readings have been 140s-150s/80s.  Side effects: none Diet: He has been trying to avoid eating salt, but notes he has been eating more bread over the last month.   Negative for unusual or severe headache, visual changes, exertional chest pain, dyspnea,  focal weakness, or edema.   UTD on routine vision care. His last appointment was a week ago.  Lab Results  Component Value Date   CREATININE 1.17 12/30/2021   BUN 19 12/30/2021   NA 139 12/30/2021   K 4.5 12/30/2021   CL 105 12/30/2021   CO2 26 12/30/2021   Hyperlipidemia: Currently on rosuvastatin 20 mg daily.  Side effects from medication: none Lab Results  Component Value Date   CHOL 138 12/30/2021   HDL 48.30 12/30/2021   LDLCALC 54 12/30/2021   LDLDIRECT 186.0 12/21/2020   TRIG 179.0 (H) 12/30/2021   CHOLHDL 3 12/30/2021   Prediabetes: Last hemoglobin A1c was improved at 5.7 (6.4). Negative for polydipsia, polyuria, polyphagia.  OSA:  He states he last had a sleep study in 2019.  He doesn't want to use a CPAP.  He endorses fatigue throughout the day.   Alcohol intake:  Patient states he has decreased his alcohol intake from ~12 beers daily to 4. On Saturdays, he says he drinks ~15.   Asthma:  He mentions he has been having some recent wheezing at night since running out of his Advair inhaler, a month ago. Negative for fever, chills, or changes in appetite. Albuterol inhaler helps with symptoms.  Neck Pain:  He mentions his neck has been hurting, left-sided, for a few days.  Pain is not radiated, he uses a TENS unit as well as ibuprofen for pain management.  Low back rash:  Patient also  complains of a intermittent pruritic rash on his lower back that comes and goes frequently.  He seems to be exacerbated by heat and sweating. He has not tried any OTC medications.  Review of Systems  Constitutional:  Positive for fatigue. Negative for activity change and fever.  HENT:  Negative for nosebleeds and sore throat.   Eyes:  Negative for redness and visual disturbance.  Gastrointestinal:  Negative for abdominal pain, nausea and vomiting.  Endocrine: Negative for cold intolerance and heat intolerance.  Genitourinary:  Negative for decreased urine volume, dysuria and hematuria.  Skin:  Negative for pallor and wound.  Allergic/Immunologic: Positive for environmental allergies.  Neurological:  Negative for syncope and facial asymmetry.  Psychiatric/Behavioral:  Negative for confusion and hallucinations.   See other pertinent positives and negatives in HPI.  Current Outpatient Medications on File Prior to Visit  Medication Sig Dispense Refill   albuterol (VENTOLIN HFA) 108 (90 Base) MCG/ACT inhaler Inhale 2 puffs into the lungs every 4 (four) hours as needed for wheezing or shortness of breath. 8.5 each 3   amLODipine (NORVASC) 5 MG tablet TAKE 1 TABLET (5 MG TOTAL) BY MOUTH DAILY. 90 tablet 1   fluticasone-salmeterol (ADVAIR) 100-50 MCG/ACT AEPB Inhale 1 puff into the lungs 2 (two) times daily. 60 each 6   rosuvastatin (CRESTOR) 20 MG tablet TAKE 1 TABLET BY MOUTH EVERY DAY 90 tablet 3  No current facility-administered medications on file prior to visit.   Past Medical History:  Diagnosis Date   Allergy    seasonal allergies   Asthma    uses inhaler when needed   Frequent headaches    Heart murmur    childhood   Hypercholesteremia    on meds   Hypertension    on meds   OSA (obstructive sleep apnea)    mild-mod (11/2013), declined CPAP to work on weight reduction   Sleep apnea    not have a CPAP machine   No Known Allergies  Social History   Socioeconomic History    Marital status: Married    Spouse name: Not on file   Number of children: 4   Years of education: 14   Highest education level: Some college, no degree  Occupational History   Occupation: Secondary school teacher  Tobacco Use   Smoking status: Former    Current packs/day: 0.00    Average packs/day: 0.3 packs/day for 7.7 years (1.9 ttl pk-yrs)    Types: Cigarettes    Start date: 2016    Quit date: 11/2021    Years since quitting: 1.1   Smokeless tobacco: Never  Vaping Use   Vaping status: Never Used  Substance and Sexual Activity   Alcohol use: Yes    Alcohol/week: 36.0 standard drinks of alcohol    Types: 6 Standard drinks or equivalent, 30 Cans of beer per week   Drug use: No   Sexual activity: Not on file  Other Topics Concern   Not on file  Social History Narrative   Fun: Swim, drink beer    Social Determinants of Health   Financial Resource Strain: Medium Risk (03/04/2021)   Overall Financial Resource Strain (CARDIA)    Difficulty of Paying Living Expenses: Somewhat hard  Food Insecurity: Food Insecurity Present (03/04/2021)   Hunger Vital Sign    Worried About Running Out of Food in the Last Year: Often true    Ran Out of Food in the Last Year: Often true  Transportation Needs: No Transportation Needs (03/04/2021)   PRAPARE - Administrator, Civil Service (Medical): No    Lack of Transportation (Non-Medical): No  Physical Activity: Sufficiently Active (03/04/2021)   Exercise Vital Sign    Days of Exercise per Week: 2 days    Minutes of Exercise per Session: 120 min  Stress: Stress Concern Present (03/04/2021)   [employer] of Occupational Health - Occupational Stress Questionnaire    Feeling of Stress : To some extent  Social Connections: Moderately Integrated (03/04/2021)   Social Connection and Isolation Panel [NHANES]    Frequency of Communication with Friends and Family: Once a week    Frequency of Social Gatherings with Friends and Family: Once a week     Attends Religious Services: More than 4 times per year    Active Member of [employer] or Organizations: Yes    Attends Banker Meetings: Not on file    Marital Status: Married   Today's Vitals   01/05/23 0703  BP: 126/80  Pulse: 73  Resp: 16  Temp: 98.7 F (37.1 C)  TempSrc: Oral  SpO2: 97%  Weight: 256 lb (116.1 kg)  Height: 6' (1.829 m)   Wt Readings from Last 3 Encounters:  01/05/23 256 lb (116.1 kg)  08/05/22 256 lb 6 oz (116.3 kg)  04/29/22 254 lb 6 oz (115.4 kg)   Body mass index is 34.72 kg/m.  Physical Exam Vitals and  nursing note reviewed.  Constitutional:      General: He is not in acute distress.    Appearance: He is well-developed.  HENT:     Head: Normocephalic and atraumatic.     Mouth/Throat:     Mouth: Mucous membranes are moist.     Pharynx: Oropharynx is clear.  Eyes:     Conjunctiva/sclera: Conjunctivae normal.  Cardiovascular:     Rate and Rhythm: Normal rate and regular rhythm.     Pulses:          Posterior tibial pulses are 2+ on the right side and 2+ on the left side.     Heart sounds: No murmur heard. Pulmonary:     Effort: Pulmonary effort is normal. No respiratory distress.     Breath sounds: Normal breath sounds.  Abdominal:     Palpations: Abdomen is soft. There is no hepatomegaly or mass.     Tenderness: There is no abdominal tenderness.  Musculoskeletal:     Cervical back: Spasms present. No tenderness. Normal range of motion.     Right lower leg: No edema.     Left lower leg: No edema.  Lymphadenopathy:     Cervical: No cervical adenopathy.  Skin:    General: Skin is warm.     Findings: No erythema or rash.  Neurological:     Mental Status: He is alert and oriented to person, place, and time.     Cranial Nerves: No cranial nerve deficit.     Gait: Gait normal.  Psychiatric:        Mood and Affect: Mood and affect normal.   ASSESSMENT AND PLAN:  [name] was seen today for chronic follow up.   Orders Placed  This Encounter  Procedures   Comprehensive metabolic panel   Hemoglobin A1c   Lipid panel   Ambulatory referral to Pulmonology   Lab Results  Component Value Date   NA 138 01/05/2023   CL 103 01/05/2023   K 4.5 01/05/2023   CO2 26 01/05/2023   BUN 18 01/05/2023   CREATININE 1.25 01/05/2023   GFR 64.93 01/05/2023   CALCIUM 9.4 01/05/2023   ALBUMIN 4.4 01/05/2023   GLUCOSE 98 01/05/2023   Lab Results  Component Value Date   ALT 39 01/05/2023   AST 36 01/05/2023   ALKPHOS 81 01/05/2023   BILITOT 0.5 01/05/2023   Lab Results  Component Value Date   HGBA1C 6.2 01/05/2023   Lab Results  Component Value Date   CHOL 173 01/05/2023   HDL 53.40 01/05/2023   LDLCALC 85 01/05/2023   LDLDIRECT 186.0 12/21/2020   TRIG 172.0 (H) 01/05/2023   CHOLHDL 3 01/05/2023   Prediabetes Assessment & Plan: Encouraged consistency with healthy lifestyle for diabetes prevention. Further recommendation will be given according to hemoglobin A1c result.  Orders: -     Comprehensive metabolic panel; Future -     Hemoglobin A1c; Future  Hypertension, essential, benign Assessment & Plan: BP is not well controlled, BP's at home 140's-150's/80's. Possible complications of elevated BP discussed. Changes today:Amlodipine dose increased from 5 mg to 10 mg. Low salt diet recommended. Also recommend decreasing alcohol intake. Continue monitoring BP at home, instructed to let me know about BP readings in 3 weeks. Eye exam is current. Instructed about warning signs. Follow-up in months.  Orders: -     Comprehensive metabolic panel; Future -     Lipid panel; Future -     amLODIPine Besylate; Take 1 tablet (10  mg total) by mouth daily.  Dispense: 90 tablet; Refill: 2  OSA (obstructive sleep apnea) Assessment & Plan: He is not wearing a CPAP. We discussed possible complications if not adequately treated. Last sleep study in 2019, he agrees with new evaluation, may need a new sleep study,  referral placed.  Orders: -     Ambulatory referral to Pulmonology  Mixed hyperlipidemia Assessment & Plan: Continue rosuvastatin 20 mg daily and low-fat diet. Further recommendation will be given according to lipid panel result.  Orders: -     Comprehensive metabolic panel; Future -     Rosuvastatin Calcium; Take 1 tablet (20 mg total) by mouth daily.  Dispense: 90 tablet; Refill: 3  Asthma in adult, mild intermittent, uncomplicated Assessment & Plan: Lung auscultation negative today. Reporting more frequent wheezing since he stopped using Advair 100-50 mcg twice daily. New prescription sent to his pharmacy. Continue albuterol 1 to 2 puff every 6 hours as needed. Referral to pulmonology placed today.  Orders: -     Ambulatory referral to Pulmonology -     Fluticasone-Salmeterol; Inhale 1 puff into the lungs 2 (two) times daily.  Dispense: 60 each; Refill: 6  Neck pain Rash is not appreciated today, history suggest intertrigo's.  Recommend keeping area dry and clean with soap and water once daily.  Pruritic rash Neck pain seems to be musculoskeletal, I do not think imaging is needed at this time.  Advise caution with frequent ibuprofen use, local ice he had an Aspercreme may also help.  I spent a total of 41 minutes in both face to face and non face to face activities for this visit on the date of this encounter. During this time history was obtained and documented, examination was performed, prior labs reviewed, and assessment/plan discussed.  Return in about 4 months (around 05/05/2023) for chronic problems.  I, Suanne Marker, acting as a scribe for Amed Datta [place], MD., have documented all relevant documentation on the behalf of Mayra Jolliffe [place], MD, as directed by  Tiernan Millikin [place], MD while in the presence of Lakysha Kossman [place], MD.   I, Suanne Marker, have reviewed all documentation for this visit. The documentation on 12/31/22 for the exam, diagnosis, procedures, and orders are all  accurate and complete.  Sharmayne Jablon G. [place], MD  [facility]. [place] office.

## 2023-01-05 ENCOUNTER — Ambulatory Visit (INDEPENDENT_AMBULATORY_CARE_PROVIDER_SITE_OTHER): Payer: Managed Care, Other (non HMO) | Admitting: Family Medicine

## 2023-01-05 ENCOUNTER — Encounter: Payer: Self-pay | Admitting: Family Medicine

## 2023-01-05 VITALS — BP 126/80 | HR 73 | Temp 98.7°F | Resp 16 | Ht 72.0 in | Wt 256.0 lb

## 2023-01-05 DIAGNOSIS — E782 Mixed hyperlipidemia: Secondary | ICD-10-CM | POA: Diagnosis not present

## 2023-01-05 DIAGNOSIS — G4733 Obstructive sleep apnea (adult) (pediatric): Secondary | ICD-10-CM

## 2023-01-05 DIAGNOSIS — L282 Other prurigo: Secondary | ICD-10-CM

## 2023-01-05 DIAGNOSIS — R7303 Prediabetes: Secondary | ICD-10-CM | POA: Diagnosis not present

## 2023-01-05 DIAGNOSIS — M542 Cervicalgia: Secondary | ICD-10-CM

## 2023-01-05 DIAGNOSIS — J4520 Mild intermittent asthma, uncomplicated: Secondary | ICD-10-CM

## 2023-01-05 DIAGNOSIS — I10 Essential (primary) hypertension: Secondary | ICD-10-CM

## 2023-01-05 LAB — COMPREHENSIVE METABOLIC PANEL
ALT: 39 U/L (ref 0–53)
AST: 36 U/L (ref 0–37)
Albumin: 4.4 g/dL (ref 3.5–5.2)
Alkaline Phosphatase: 81 U/L (ref 39–117)
BUN: 18 mg/dL (ref 6–23)
CO2: 26 meq/L (ref 19–32)
Calcium: 9.4 mg/dL (ref 8.4–10.5)
Chloride: 103 meq/L (ref 96–112)
Creatinine, Ser: 1.25 mg/dL (ref 0.40–1.50)
GFR: 64.93 mL/min (ref >60.00)
Glucose, Bld: 98 mg/dL (ref 70–99)
Potassium: 4.5 meq/L (ref 3.5–5.1)
Sodium: 138 meq/L (ref 135–145)
Total Bilirubin: 0.5 mg/dL (ref 0.2–1.2)
Total Protein: 7.6 g/dL (ref 6.0–8.3)

## 2023-01-05 LAB — LIPID PANEL
Cholesterol: 173 mg/dL (ref 0–200)
HDL: 53.4 mg/dL (ref >39.00)
LDL Cholesterol: 85 mg/dL (ref 0–99)
NonHDL: 119.25
Total CHOL/HDL Ratio: 3
Triglycerides: 172 mg/dL — ABNORMAL HIGH (ref 0.0–149.0)
VLDL: 34.4 mg/dL (ref 0.0–40.0)

## 2023-01-05 LAB — HEMOGLOBIN A1C: Hgb A1c MFr Bld: 6.2 % (ref 4.6–6.5)

## 2023-01-05 MED ORDER — AMLODIPINE BESYLATE 10 MG PO TABS: 10.0000 mg | ORAL_TABLET | Freq: Every day | ORAL | 2 refills | Status: DC

## 2023-01-05 MED ORDER — FLUTICASONE-SALMETEROL 100-50 MCG/ACT IN AEPB: 1.0000 | INHALATION_SPRAY | Freq: Two times a day (BID) | RESPIRATORY_TRACT | 6 refills | Status: AC

## 2023-01-05 MED ORDER — ROSUVASTATIN CALCIUM 20 MG PO TABS: 20.0000 mg | ORAL_TABLET | Freq: Every day | ORAL | 3 refills | Status: DC

## 2023-01-05 NOTE — Patient Instructions (Addendum)
A few things to remember from today's visit:  Prediabetes - Plan: Comprehensive metabolic panel, Hemoglobin A1c  Hypertension, essential, benign - Plan: Comprehensive metabolic panel, Lipid panel, amLODipine (NORVASC) 10 MG tablet  OSA (obstructive sleep apnea) - Plan: Ambulatory referral to Pulmonology  Mixed hyperlipidemia - Plan: Comprehensive metabolic panel  Asthma in adult, mild intermittent, uncomplicated - Plan: Ambulatory referral to Pulmonology, fluticasone-salmeterol (ADVAIR) 100-50 MCG/ACT AEPB  Amlodipine increased from 5 mg to 10 mg daily. Blood pressure goal 120's/70's. Continue working on decreasing beer intake. Please call to schedule your colonoscopy.  If you need refills for medications you take chronically, please call your pharmacy. Do not use My Chart to request refills or for acute issues that need immediate attention. If you send a my chart message, it may take a few days to be addressed, specially if I am not in the office.  Please be sure medication list is accurate. If a new problem present, please set up appointment sooner than planned today.

## 2023-01-05 NOTE — Assessment & Plan Note (Signed)
Encouraged consistency with healthy lifestyle for diabetes prevention. Further recommendation will be given according to hemoglobin A1c result.

## 2023-01-05 NOTE — Assessment & Plan Note (Signed)
He is not wearing a CPAP. We discussed possible complications if not adequately treated. Last sleep study in 2019, he agrees with new evaluation, may need a new sleep study, referral placed.

## 2023-01-05 NOTE — Assessment & Plan Note (Addendum)
Lung auscultation negative today. Reporting more frequent wheezing since he stopped using Advair 100-50 mcg twice daily. New prescription sent to his pharmacy. Continue albuterol 1 to 2 puff every 6 hours as needed. Referral to pulmonology placed today.

## 2023-01-05 NOTE — Assessment & Plan Note (Signed)
Continue rosuvastatin 20 mg daily and low-fat diet. Further recommendation will be given according to lipid panel result.

## 2023-01-05 NOTE — Assessment & Plan Note (Signed)
BP is not well controlled, BP's at home 140's-150's/80's. Possible complications of elevated BP discussed. Changes today:Amlodipine dose increased from 5 mg to 10 mg. Low salt diet recommended. Continue monitoring BP at home, instructed to let me know about BP readings in 3 weeks. Eye exam is current. Instructed about warning signs. Follow-up in months.

## 2023-03-18 ENCOUNTER — Encounter: Payer: Self-pay | Admitting: Family Medicine

## 2023-03-18 DIAGNOSIS — Z1211 Encounter for screening for malignant neoplasm of colon: Secondary | ICD-10-CM

## 2023-05-04 ENCOUNTER — Ambulatory Visit: Payer: Managed Care, Other (non HMO) | Admitting: Family Medicine

## 2023-05-08 ENCOUNTER — Ambulatory Visit (INDEPENDENT_AMBULATORY_CARE_PROVIDER_SITE_OTHER): Payer: Managed Care, Other (non HMO) | Admitting: Family Medicine

## 2023-05-08 ENCOUNTER — Encounter: Payer: Self-pay | Admitting: Family Medicine

## 2023-05-08 VITALS — BP 136/80 | HR 62 | Temp 98.5°F | Resp 16 | Ht 72.0 in | Wt 259.0 lb

## 2023-05-08 DIAGNOSIS — R7303 Prediabetes: Secondary | ICD-10-CM

## 2023-05-08 DIAGNOSIS — I10 Essential (primary) hypertension: Secondary | ICD-10-CM

## 2023-05-08 DIAGNOSIS — J4520 Mild intermittent asthma, uncomplicated: Secondary | ICD-10-CM

## 2023-05-08 DIAGNOSIS — M549 Dorsalgia, unspecified: Secondary | ICD-10-CM

## 2023-05-08 DIAGNOSIS — E66811 Obesity, class 1: Secondary | ICD-10-CM

## 2023-05-08 LAB — POCT GLYCOSYLATED HEMOGLOBIN (HGB A1C): HbA1c, POC (prediabetic range): 5.7 % (ref 5.7–6.4)

## 2023-05-08 NOTE — Patient Instructions (Addendum)
 A few things to remember from today's visit:  Asthma in adult, mild intermittent, uncomplicated  Hypertension, essential, benign  Prediabetes - Plan: POC HgB A1c  No changes today.  Please call gastro at [phone] to schedule this appointment or to update your records at your earliest convenience.   Let me know if you want to try PT for back pain. Massage and icy hot may help. Check blood pressure once per week.  If you need refills for medications you take chronically, please call your pharmacy. Do not use My Chart to request refills or for acute issues that need immediate attention. If you send a my chart message, it may take a few days to be addressed, specially if I am not in the office.  Please be sure medication list is accurate. If a new problem present, please set up appointment sooner than planned today.

## 2023-05-08 NOTE — Assessment & Plan Note (Addendum)
 BP mildly elevated today, he is not monitoring BP frequently at home but it was 120's/ 70s when he did a few months ago. Continue amlodipine 10 mg daily. Low-salt diet also recommended. Eye exam is current. Instructed to monitor BP once per week. Follow-up in 12/2023, before if needed.

## 2023-05-08 NOTE — Progress Notes (Signed)
 HPI: Rick Harris is a 58 y.o. male, who is here today for chronic disease management.  Last seen on 01/05/2023.  Hypertension:  Medications: Amlodipine 10 mg once daily Side effects: None.  BP readings at home: Last checked in January, was 127/79. Last eye exam is UTD, done in late 2024.  Negative for unusual or severe headache, visual changes, exertional chest pain, dyspnea,  focal weakness, or edema.  Sleep study planned for 05/13/23.  Lab Results  Component Value Date   CREATININE 1.25 01/05/2023   BUN 18 01/05/2023   NA 138 01/05/2023   K 4.5 01/05/2023   CL 103 01/05/2023   CO2 26 01/05/2023   He has not made any significant dietary changes since last visit.  Reports eating more bread and rice than he previously did.  He is not exercising regularly.  Asthma: Pt is on Advair 100-50 mcg BID consistently and albuterol inhaler.  He has not needed to use his albuterol in a few months.  Denies any recent wheezing, cough or dyspnea.   The following acute concerns were addressed: Left upper back pain: Pt complains of left-sided upper back pain, starting 3-4 months.  His symptoms have improved overall.  He recalls previous episodes when he experienced pain with any movement.  He has tried getting massages from family members (son/wife).  Denies any numbness, tingling, or radiation to LUE.  Rash: He complains of a rash on his back with associated itchiness and "pimples".  Pt has not applied any ointments or topical creams to the area.  He has tried wiping alcohol on his back.  Negative for new medications, skin products,or exposures.  Review of Systems  Constitutional:  Negative for chills, fever and malaise/fatigue.  HENT:  Negative for ear pain, hearing loss and sore throat.   Respiratory:  Negative for hemoptysis and sputum production.   Gastrointestinal:  Negative for abdominal pain, nausea and vomiting.  Genitourinary:  Negative for dysuria and hematuria.   Musculoskeletal:  Negative for falls.  Neurological:  Negative for tingling, speech change and seizures.  Psychiatric/Behavioral:  The patient is not nervous/anxious.   See other pertinent positives and negatives in HPI.  Current Outpatient Medications on File Prior to Visit  Medication Sig Dispense Refill   albuterol (VENTOLIN HFA) 108 (90 Base) MCG/ACT inhaler Inhale 2 puffs into the lungs every 4 (four) hours as needed for wheezing or shortness of breath. 8.5 each 3   amLODipine (NORVASC) 10 MG tablet Take 1 tablet (10 mg total) by mouth daily. 90 tablet 2   fluticasone-salmeterol (ADVAIR) 100-50 MCG/ACT AEPB Inhale 1 puff into the lungs 2 (two) times daily. 60 each 6   rosuvastatin (CRESTOR) 20 MG tablet Take 1 tablet (20 mg total) by mouth daily. 90 tablet 3   No current facility-administered medications on file prior to visit.    Past Medical History:  Diagnosis Date   Allergy    seasonal allergies   Asthma    uses inhaler when needed   Frequent headaches    Heart murmur    childhood   Hypercholesteremia    on meds   Hypertension    on meds   OSA (obstructive sleep apnea)    mild-mod (11/2013), declined CPAP to work on weight reduction   Sleep apnea    not have a CPAP machine   No Known Allergies  Social History   Socioeconomic History   Marital status: Married    Spouse name: Not on file  Number of children: 4   Years of education: 14   Highest education level: Some college, no degree  Occupational History   Occupation: Secondary school teacher  Tobacco Use   Smoking status: Former    Current packs/day: 0.00    Average packs/day: 0.3 packs/day for 7.7 years (1.9 ttl pk-yrs)    Types: Cigarettes    Start date: 2016    Quit date: 11/2021    Years since quitting: 1.4   Smokeless tobacco: Never  Vaping Use   Vaping status: Never Used  Substance and Sexual Activity   Alcohol use: Yes    Alcohol/week: 36.0 standard drinks of alcohol    Types: 6 Standard drinks or  equivalent, 30 Cans of beer per week   Drug use: No   Sexual activity: Not on file  Other Topics Concern   Not on file  Social History Narrative   Fun: Swim, drink beer    Social Drivers of Health   Financial Resource Strain: Medium Risk (03/04/2021)   Overall Financial Resource Strain (CARDIA)    Difficulty of Paying Living Expenses: Somewhat hard  Food Insecurity: Food Insecurity Present (03/04/2021)   Hunger Vital Sign    Worried About Running Out of Food in the Last Year: Often true    Ran Out of Food in the Last Year: Often true  Transportation Needs: No Transportation Needs (03/04/2021)   PRAPARE - Administrator, Civil Service (Medical): No    Lack of Transportation (Non-Medical): No  Physical Activity: Sufficiently Active (03/04/2021)   Exercise Vital Sign    Days of Exercise per Week: 2 days    Minutes of Exercise per Session: 120 min  Stress: Stress Concern Present (03/04/2021)   [employer] of Occupational Health - Occupational Stress Questionnaire    Feeling of Stress : To some extent  Social Connections: Moderately Integrated (03/04/2021)   Social Connection and Isolation Panel [NHANES]    Frequency of Communication with Friends and Family: Once a week    Frequency of Social Gatherings with Friends and Family: Once a week    Attends Religious Services: More than 4 times per year    Active Member of [employer] or Organizations: Yes    Attends Banker Meetings: Not on file    Marital Status: Married   Vitals:   05/08/23 0700  BP: 136/80  Pulse: 62  Resp: 16  Temp: 98.5 F (36.9 C)  SpO2: 98%   Body mass index is 35.13 kg/m.  Physical Exam Vitals and nursing note reviewed.  Constitutional:      General: He is not in acute distress.    Appearance: He is well-developed.  HENT:     Head: Normocephalic and atraumatic.     Mouth/Throat:     Mouth: Mucous membranes are moist.     Pharynx: Oropharynx is clear.  Eyes:      Conjunctiva/sclera: Conjunctivae normal.  Cardiovascular:     Rate and Rhythm: Normal rate and regular rhythm.     Pulses:          Dorsalis pedis pulses are 2+ on the right side and 2+ on the left side.     Heart sounds: No murmur heard.    Comments: Trace pitting LE edema, bilateral. Pulmonary:     Effort: Pulmonary effort is normal. No respiratory distress.     Breath sounds: Normal breath sounds.  Abdominal:     Palpations: Abdomen is soft. There is no hepatomegaly or mass.  Tenderness: There is no abdominal tenderness.  Musculoskeletal:     Cervical back: Tenderness present. No spasms or bony tenderness. No pain with movement.       Back:  Lymphadenopathy:     Cervical: No cervical adenopathy.  Skin:    General: Skin is warm.     Findings: No erythema or rash.     Comments: Scatted postinflammatory pigmentation changes on upper and lower back.  Neurological:     Mental Status: He is alert and oriented to person, place, and time.     Cranial Nerves: No cranial nerve deficit.     Gait: Gait normal.  Psychiatric:        Mood and Affect: Mood and affect normal.   ASSESSMENT AND PLAN:  Mr. MATTHEW CINA was seen today for chronic disease management.   Orders Placed This Encounter  Procedures   POC HgB A1c   Lab Results  Component Value Date   HGBA1C 5.7 05/08/2023   Prediabetes Assessment & Plan: Hemoglobin A1c improved, it went from 6.2 to 5.7. Encouraged consistency with a healthy lifestyle for diabetes prevention.  Orders: -     POCT glycosylated hemoglobin (Hb A1C)  Asthma in adult, mild intermittent, uncomplicated Assessment & Plan: Problem is well-controlled. Continue Advair 100-50 mcg 1 puff twice daily and albuterol inhaler 1 to 2 puff every 6 hours as needed.  Hypertension, essential, benign Assessment & Plan: BP mildly elevated today, he is not monitoring BP frequently at home but it was 120's/ 70s when he did a few months ago. Continue  amlodipine 10 mg daily. Low-salt diet also recommended. Eye exam is current. Instructed to monitor BP once per week. Follow-up in 12/2023, before if needed.  Upper back pain on left side History and examination do not suggest serious process, I do not think imaging is needed at this time. I think he would benefit from PT, he prefers to hold on this for now. Local massage, range of motion exercises, and IcyHot on affected area may help.  Obesity, Class I, BMI 30.0-34.9 (see actual BMI) Assessment & Plan: Consistency with healthful diet and physical activity encouraged.  -In regard to back skin rash, not appreciated today. If pruritus reoccurs, recommend OTC hydrocortisone cream 1%, small amount at a time daily as needed.  Return in about 34 weeks (around 01/01/2024) for CPE.  I, Isabelle Course, acting as a scribe for Jenney Brester [place], MD., have documented all relevant documentation on the behalf of Cheryel Kyte [place], MD, as directed by  Dazja Houchin [place], MD while in the presence of Ocean Schildt [place], MD.  I, Estrella Alcaraz [place], MD, have reviewed all documentation for this visit. The documentation on 05/08/23 for the exam, diagnosis, procedures, and orders are all accurate and complete.  Carlson Belland G. [place], MD  [facility]. [place] [facility].

## 2023-05-08 NOTE — Assessment & Plan Note (Signed)
 Consistency with healthful diet and physical activity encouraged.

## 2023-05-08 NOTE — Assessment & Plan Note (Signed)
 Hemoglobin A1c improved, it went from 6.2 to 5.7. Encouraged consistency with a healthy lifestyle for diabetes prevention.

## 2023-05-08 NOTE — Assessment & Plan Note (Signed)
 Problem is well-controlled. Continue Advair 100-50 mcg 1 puff twice daily and albuterol inhaler 1 to 2 puff every 6 hours as needed.

## 2023-05-18 ENCOUNTER — Ambulatory Visit: Payer: Managed Care, Other (non HMO) | Admitting: Pulmonary Disease

## 2023-05-18 ENCOUNTER — Encounter: Payer: Self-pay | Admitting: Pulmonary Disease

## 2023-05-18 VITALS — BP 134/80 | HR 55 | Ht 72.0 in | Wt 257.0 lb

## 2023-05-18 DIAGNOSIS — G478 Other sleep disorders: Secondary | ICD-10-CM

## 2023-05-18 DIAGNOSIS — G4733 Obstructive sleep apnea (adult) (pediatric): Secondary | ICD-10-CM | POA: Diagnosis not present

## 2023-05-18 DIAGNOSIS — R0683 Snoring: Secondary | ICD-10-CM | POA: Diagnosis not present

## 2023-05-18 NOTE — Patient Instructions (Addendum)
 We will schedule for home sleep test  We will inform you with results as soon as reviewed  Your previous sleep study was positive for sleep apnea that was mild with about 15 events an hour  Weight loss efforts Trying to sleep with the head of the bed elevated Try sleeping on your side will also help

## 2023-05-18 NOTE — Progress Notes (Signed)
 MAT STUARD    [id number]    01/18/1966  Primary Care Physician:Jordan, Timoteo Expose, MD  Referring Physician: [place], Betty G, MD [address] [place],  [place] [address]  Chief complaint:   Patient in for snoring, nonrestorative sleep, multiple awakenings Spouse was present for the visit  HPI:  History of snoring, multiple awakenings Did have a sleep study previously showing mild obstructive sleep apnea with an AHI of 14.8 Did not start CPAP then  Spouse is concerned about snoring and erratic breathing at night Patient will wake up multiple times during the night sometimes unable to fall back asleep  Usually goes to bed between 11 and 12 PM Able to fall asleep soon after, sometimes takes up to 30 minutes Wakes up about twice Final wake up time about 5:30 in the morning  Weight is up about 15 pounds the last year or 2  Last sleep study was at [facility] in 2015 - Showing mild obstructive sleep apnea  He has a history of hypertension, hypercholesterolemia  Admits to dryness of his mouth in the morning Occasional gasping respirations during sleep No morning headaches No night sweats Memory is good Never smoked actively in the past  No pertinent occupational history  No family history of sleep disordered breathing  Outpatient Encounter Medications as of 05/18/2023  Medication Sig   albuterol (VENTOLIN HFA) 108 (90 Base) MCG/ACT inhaler Inhale 2 puffs into the lungs every 4 (four) hours as needed for wheezing or shortness of breath.   amLODipine (NORVASC) 10 MG tablet Take 1 tablet (10 mg total) by mouth daily.   fluticasone-salmeterol (ADVAIR) 100-50 MCG/ACT AEPB Inhale 1 puff into the lungs 2 (two) times daily.   rosuvastatin (CRESTOR) 20 MG tablet Take 1 tablet (20 mg total) by mouth daily.   No facility-administered encounter medications on file as of 05/18/2023.    Allergies as of 05/18/2023   (No Known Allergies)    Past Medical History:   Diagnosis Date   Allergy    seasonal allergies   Asthma    uses inhaler when needed   Frequent headaches    Heart murmur    childhood   Hypercholesteremia    on meds   Hypertension    on meds   OSA (obstructive sleep apnea)    mild-mod (11/2013), declined CPAP to work on weight reduction   Sleep apnea    not have a CPAP machine    Past Surgical History:  Procedure Laterality Date   APPENDECTOMY  2009   NASAL SINUS SURGERY  04/1997   WISDOM TOOTH EXTRACTION  2018    Family History  Problem Relation Age of Onset   Alcohol abuse Mother    Hyperlipidemia Father        not biologic   Stroke Father        not biologic   Hypertension Father        not biologic   Diabetes Father        not biologic   Lung cancer Maternal Uncle    Heart disease Maternal Grandmother    Glaucoma Maternal Grandmother 52   Lung disease Maternal Grandfather    Colon polyps Neg Hx    Colon cancer Neg Hx    Esophageal cancer Neg Hx    Stomach cancer Neg Hx    Rectal cancer Neg Hx     Social History   Socioeconomic History   Marital status: Married  Spouse name: Not on file   Number of children: 4   Years of education: 14   Highest education level: Some college, no degree  Occupational History   Occupation: Secondary school teacher  Tobacco Use   Smoking status: Former    Current packs/day: 0.00    Average packs/day: 0.3 packs/day for 7.7 years (1.9 ttl pk-yrs)    Types: Cigarettes    Start date: 2016    Quit date: 11/2021    Years since quitting: 1.5   Smokeless tobacco: Never  Vaping Use   Vaping status: Never Used  Substance and Sexual Activity   Alcohol use: Yes    Alcohol/week: 36.0 standard drinks of alcohol    Types: 6 Standard drinks or equivalent, 30 Cans of beer per week   Drug use: No   Sexual activity: Not on file  Other Topics Concern   Not on file  Social History Narrative   Fun: Swim, drink beer    Social Drivers of Health   Financial Resource Strain: Medium Risk  (03/04/2021)   Overall Financial Resource Strain (CARDIA)    Difficulty of Paying Living Expenses: Somewhat hard  Food Insecurity: Food Insecurity Present (03/04/2021)   Hunger Vital Sign    Worried About Running Out of Food in the Last Year: Often true    Ran Out of Food in the Last Year: Often true  Transportation Needs: No Transportation Needs (03/04/2021)   PRAPARE - Administrator, Civil Service (Medical): No    Lack of Transportation (Non-Medical): No  Physical Activity: Sufficiently Active (03/04/2021)   Exercise Vital Sign    Days of Exercise per Week: 2 days    Minutes of Exercise per Session: 120 min  Stress: Stress Concern Present (03/04/2021)   [employer] of Occupational Health - Occupational Stress Questionnaire    Feeling of Stress : To some extent  Social Connections: Moderately Integrated (03/04/2021)   Social Connection and Isolation Panel [NHANES]    Frequency of Communication with Friends and Family: Once a week    Frequency of Social Gatherings with Friends and Family: Once a week    Attends Religious Services: More than 4 times per year    Active Member of [employer] or Organizations: Yes    Attends Banker Meetings: Not on file    Marital Status: Married  Catering manager Violence: Not on file    Review of Systems  Respiratory:  Positive for apnea.   Psychiatric/Behavioral:  Positive for sleep disturbance.     Vitals:   05/18/23 0826  BP: 134/80  Pulse: (!) 55  SpO2: 96%     Physical Exam Constitutional:      Appearance: He is obese.  HENT:     Head: Normocephalic.     Mouth/Throat:     Mouth: Mucous membranes are moist.     Comments: Mallampati 4, macroglossia Eyes:     General: No scleral icterus.    Pupils: Pupils are equal, round, and reactive to light.  Cardiovascular:     Rate and Rhythm: Normal rate and regular rhythm.     Heart sounds: No murmur heard.    No friction rub.  Pulmonary:     Effort: No respiratory  distress.     Breath sounds: No stridor. No wheezing or rhonchi.  Musculoskeletal:     Cervical back: No rigidity or tenderness.  Neurological:     Mental Status: He is alert.  Psychiatric:        Mood  and Affect: Mood normal.       05/18/2023    8:00 AM  Results of the Epworth flowsheet  Sitting and reading 3  Watching TV 3  Sitting, inactive in a public place (e.g. a theatre or a meeting) 3  As a passenger in a car for an hour without a break 3  Lying down to rest in the afternoon when circumstances permit 3  Sitting and talking to someone 2  Sitting quietly after a lunch without alcohol 0  In a car, while stopped for a few minutes in traffic 0  Total score 17    Data Reviewed: Previous sleep study from 2015 reviewed showing mild obstructive sleep apnea  Assessment:  Excessive daytime sleepiness  History of mild obstructive sleep apnea  Nonrestrictive sleep  Multiple awakenings  Pathophysiology of sleep disordered breathing was reviewed with the patient Treatment options reviewed with the patient  risks of not treating sleep disordered breathing discussed with the patient   Plan/Recommendations:  Schedule patient for home sleep test  Weight loss efforts encouraged  Behavioral modifications that may help with sleep quality including sleeping with the head of bed elevated, Nonsupine sleep-may help with his snoring  Encouraged to call with significant concerns  Tentative follow-up in about 3 months   Virl Diamond MD [facility] 05/18/2023, 9:38 AM  CC: [place], Betty G, MD

## 2023-07-28 ENCOUNTER — Encounter

## 2023-07-28 DIAGNOSIS — G478 Other sleep disorders: Secondary | ICD-10-CM

## 2023-07-28 DIAGNOSIS — R0683 Snoring: Secondary | ICD-10-CM

## 2023-07-28 DIAGNOSIS — G4733 Obstructive sleep apnea (adult) (pediatric): Secondary | ICD-10-CM

## 2023-07-31 ENCOUNTER — Ambulatory Visit (AMBULATORY_SURGERY_CENTER)

## 2023-07-31 VITALS — Ht 73.0 in | Wt 256.0 lb

## 2023-07-31 DIAGNOSIS — Z860100 Personal history of colon polyps, unspecified: Secondary | ICD-10-CM

## 2023-07-31 MED ORDER — NA SULFATE-K SULFATE-MG SULF 17.5-3.13-1.6 GM/177ML PO SOLN: 1.0000 | Freq: Once | ORAL | 0 refills | Status: AC

## 2023-07-31 NOTE — Progress Notes (Signed)

## 2023-08-04 ENCOUNTER — Encounter: Payer: Self-pay | Admitting: Gastroenterology

## 2023-08-14 ENCOUNTER — Telehealth: Payer: Self-pay | Admitting: Pulmonary Disease

## 2023-08-14 DIAGNOSIS — G4733 Obstructive sleep apnea (adult) (pediatric): Secondary | ICD-10-CM | POA: Diagnosis not present

## 2023-08-14 NOTE — Telephone Encounter (Signed)
 Call patient  Sleep study result  Date of study: 07/28/2023  Impression: Severe obstructive sleep apnea with severe oxygen desaturations AHI of 40.1, oxygen nadir of 67%  Recommendation: DME referral  Recommend CPAP therapy for severe obstructive sleep apnea  Auto titrating CPAP with pressure settings of 5-20 will be appropriate, with heated humidification with patient's mask of choice.  Encourage weight loss measures  Follow-up in the office 4 to 6 weeks following initiation of treatment

## 2023-08-21 ENCOUNTER — Encounter: Admitting: Gastroenterology

## 2023-08-21 ENCOUNTER — Ambulatory Visit (INDEPENDENT_AMBULATORY_CARE_PROVIDER_SITE_OTHER): Admitting: Pulmonary Disease

## 2023-08-21 VITALS — BP 117/74 | HR 53

## 2023-08-21 DIAGNOSIS — G4733 Obstructive sleep apnea (adult) (pediatric): Secondary | ICD-10-CM

## 2023-08-21 NOTE — Telephone Encounter (Signed)
 Patient came into the office and was given result's

## 2023-08-21 NOTE — Patient Instructions (Signed)
 We will contact the medical supply company to set you up with a CPAP  Auto CPAP 5-20 with heated humidification with patient's mask of choice  Tentative follow-up in about 2 to 3 months  Continue weight loss efforts  Call us  with significant concerns

## 2023-08-21 NOTE — Progress Notes (Signed)
 Rick Harris    [id number]    07-19-1965  Primary Care Physician:Rick Harris [name], MD  Referring Physician: [place], Betty G, MD [address] [place],  [place] [address]  Chief complaint:   Patient in for snoring, nonrestorative sleep, multiple awakenings Spouse was present for the visit  HPI:  History of sleep apnea Had a recent sleep study showing severe obstructive sleep apnea with an AHI of 40.1  Snoring, multiple awakenings, previous sleep study had shown an AHI of 14.8, did not start CPAP previously  Accompanied by spouse today and we did discuss the findings on the sleep study and options of treatment  He did have some difficulty with the testing but he is willing to give CPAP trial  Usually goes to bed between 11 and 12 PM Able to fall asleep soon after, sometimes takes up to 30 minutes Wakes up about twice Final wake up time about 5:30 in the morning  Weight is up about 15 pounds the last year or 2  Last sleep study was at [facility] in 2015 - Showing mild obstructive sleep apnea  He has a history of hypertension, hypercholesterolemia  Admits to dryness of his mouth in the morning Occasional gasping respirations during sleep No morning headaches No night sweats Memory is good Never smoked actively in the past  No pertinent occupational history  No family history of sleep disordered breathing  Outpatient Encounter Medications as of 08/21/2023  Medication Sig   albuterol  (VENTOLIN  HFA) 108 (90 Base) MCG/ACT inhaler Inhale 2 puffs into the lungs every 4 (four) hours as needed for wheezing or shortness of breath.   amLODipine  (NORVASC ) 10 MG tablet Take 1 tablet (10 mg total) by mouth daily.   fluticasone -salmeterol (ADVAIR) 100-50 MCG/ACT AEPB Inhale 1 puff into the lungs 2 (two) times daily.   rosuvastatin  (CRESTOR ) 20 MG tablet Take 1 tablet (20 mg total) by mouth daily.   No facility-administered encounter medications on file as of  08/21/2023.    Allergies as of 08/21/2023   (No Known Allergies)    Past Medical History:  Diagnosis Date   Allergy    seasonal allergies   Asthma    uses inhaler when needed   Frequent headaches    Heart murmur    childhood   Hypercholesteremia    on meds   Hypertension    on meds   OSA (obstructive sleep apnea)    mild-mod (11/2013), declined CPAP to work on weight reduction   Sleep apnea    not have a CPAP machine    Past Surgical History:  Procedure Laterality Date   APPENDECTOMY  2009   NASAL SINUS SURGERY  04/1997   WISDOM TOOTH EXTRACTION  2018    Family History  Problem Relation Age of Onset   Alcohol abuse Mother    Hyperlipidemia Father        not biologic   Stroke Father        not biologic   Hypertension Father        not biologic   Diabetes Father        not biologic   Lung cancer Maternal Uncle    Heart disease Maternal Grandmother    Glaucoma Maternal Grandmother 52   Lung disease Maternal Grandfather    Colon polyps Neg Hx    Colon cancer Neg Hx    Esophageal cancer Neg Hx    Stomach cancer Neg Hx  Rectal cancer Neg Hx     Social History   Socioeconomic History   Marital status: Married    Spouse name: Not on file   Number of children: 4   Years of education: 14   Highest education level: Some college, no degree  Occupational History   Occupation: Secondary school teacher  Tobacco Use   Smoking status: Former    Current packs/day: 0.00    Average packs/day: 0.3 packs/day for 7.7 years (1.9 ttl pk-yrs)    Types: Cigarettes    Start date: 2016    Quit date: 11/2021    Years since quitting: 1.7   Smokeless tobacco: Never  Vaping Use   Vaping status: Never Used  Substance and Sexual Activity   Alcohol use: Yes    Alcohol/week: 36.0 standard drinks of alcohol    Types: 6 Standard drinks or equivalent, 30 Cans of beer per week   Drug use: No   Sexual activity: Not on file  Other Topics Concern   Not on file  Social History Narrative    Fun: Swim, drink beer    Social Drivers of Health   Financial Resource Strain: Medium Risk (03/04/2021)   Overall Financial Resource Strain (CARDIA)    Difficulty of Paying Living Expenses: Somewhat hard  Food Insecurity: Food Insecurity Present (03/04/2021)   Hunger Vital Sign    Worried About Running Out of Food in the Last Year: Often true    Ran Out of Food in the Last Year: Often true  Transportation Needs: No Transportation Needs (03/04/2021)   PRAPARE - Administrator, Civil Service (Medical): No    Lack of Transportation (Non-Medical): No  Physical Activity: Sufficiently Active (03/04/2021)   Exercise Vital Sign    Days of Exercise per Week: 2 days    Minutes of Exercise per Session: 120 min  Stress: Stress Concern Present (03/04/2021)   [employer] of Occupational Health - Occupational Stress Questionnaire    Feeling of Stress : To some extent  Social Connections: Moderately Integrated (03/04/2021)   Social Connection and Isolation Panel    Frequency of Communication with Friends and Family: Once a week    Frequency of Social Gatherings with Friends and Family: Once a week    Attends Religious Services: More than 4 times per year    Active Member of [employer] or Organizations: Yes    Attends Banker Meetings: Not on file    Marital Status: Married  Catering manager Violence: Not on file    Review of Systems  Respiratory:  Positive for apnea.   Psychiatric/Behavioral:  Positive for sleep disturbance.     Vitals:   08/21/23 0900  BP: 117/74  Pulse: (!) 53  SpO2: 97%     Physical Exam Constitutional:      Appearance: He is obese.  HENT:     Head: Normocephalic.     Mouth/Throat:     Mouth: Mucous membranes are moist.     Comments: Mallampati 4, macroglossia Eyes:     General: No scleral icterus.    Pupils: Pupils are equal, round, and reactive to light.  Cardiovascular:     Rate and Rhythm: Normal rate and regular rhythm.     Heart  sounds: No murmur heard.    No friction rub.  Pulmonary:     Effort: No respiratory distress.     Breath sounds: No stridor. No wheezing or rhonchi.  Musculoskeletal:     Cervical back: No rigidity or tenderness.  Neurological:     Mental Status: He is alert.  Psychiatric:        Mood and Affect: Mood normal.       05/18/2023    8:00 AM  Results of the Epworth flowsheet  Sitting and reading 3  Watching TV 3  Sitting, inactive in a public place (e.Harris. a theatre or a meeting) 3  As a passenger in a car for an hour without a break 3  Lying down to rest in the afternoon when circumstances permit 3  Sitting and talking to someone 2  Sitting quietly after a lunch without alcohol 0  In a car, while stopped for a few minutes in traffic 0  Total score 17    Data Reviewed: Previous sleep study from 2015 reviewed showing mild obstructive sleep apnea  Recent sleep study reviewed with the patient showing severe obstructive sleep apnea with an AHI of 40.1  Assessment:  Severe obstructive sleep apnea  Nonrestorative sleep  Multiple awakenings  Class II obesity  Pathophysiology of sleep disordered breathing was reviewed with the patient Treatment options reviewed with the patient  risks of not treating sleep disordered breathing discussed with the patient  Options of treatment including aggressive weight loss, CPAP therapy, inspire device therapy discussed -Patient not inclined towards surgical options  Plan/Recommendations:  Contact DME for CPAP therapy  Auto CPAP 5-20 with heated modification with patient's mask of choice  Encouraged aggressive weight loss measures  Tentative follow-up in about 2 to 3 months  Encouraged to make sure he focuses on weight loss efforts  Jennet Epley MD [facility] 08/21/2023, 9:09 AM  CC: [place], Betty G, MD

## 2023-09-11 ENCOUNTER — Encounter: Payer: Self-pay | Admitting: Gastroenterology

## 2023-09-11 ENCOUNTER — Ambulatory Visit: Admitting: Gastroenterology

## 2023-09-11 VITALS — BP 110/68 | HR 51 | Temp 97.7°F | Resp 15 | Ht 72.0 in | Wt 256.0 lb

## 2023-09-11 DIAGNOSIS — D123 Benign neoplasm of transverse colon: Secondary | ICD-10-CM

## 2023-09-11 DIAGNOSIS — Z860101 Personal history of adenomatous and serrated colon polyps: Secondary | ICD-10-CM

## 2023-09-11 DIAGNOSIS — D122 Benign neoplasm of ascending colon: Secondary | ICD-10-CM

## 2023-09-11 DIAGNOSIS — K648 Other hemorrhoids: Secondary | ICD-10-CM

## 2023-09-11 DIAGNOSIS — Z1211 Encounter for screening for malignant neoplasm of colon: Secondary | ICD-10-CM

## 2023-09-11 DIAGNOSIS — Z860100 Personal history of colon polyps, unspecified: Secondary | ICD-10-CM

## 2023-09-11 MED ORDER — SODIUM CHLORIDE 0.9 % IV SOLN: 500.0000 mL | Freq: Once | INTRAVENOUS | Status: DC

## 2023-09-11 NOTE — Op Note (Signed)
 [facility] Patient Name: Rick Harris Procedure Date: 09/11/2023 7:10 AM MRN: [id number] Endoscopist: Elspeth P. Leigh , MD, [id number] Age: 58 Referring MD:  Date of Birth: October 23, 1965 Gender: Male Account #: [id number] Procedure:                Colonoscopy Indications:              High risk colon cancer surveillance: Personal                            history of colonic polyps - 6 polyps removed 2021 Medicines:                Monitored Anesthesia Care Procedure:                Pre-Anesthesia Assessment:                           - Prior to the procedure, a History and Physical                            was performed, and patient medications and                            allergies were reviewed. The patient's tolerance of                            previous anesthesia was also reviewed. The risks                            and benefits of the procedure and the sedation                            options and risks were discussed with the patient.                            All questions were answered, and informed consent                            was obtained. Prior Anticoagulants: The patient has                            taken no anticoagulant or antiplatelet agents. ASA                            Grade Assessment: II - A patient with mild systemic                            disease. After reviewing the risks and benefits,                            the patient was deemed in satisfactory condition to                            undergo the procedure.  After obtaining informed consent, the colonoscope                            was passed under direct vision. Throughout the                            procedure, the patient's blood pressure, pulse, and                            oxygen saturations were monitored continuously. The                            Olympus Scope SN [id number] was introduced through the                            anus  and advanced to the the cecum, identified by                            appendiceal orifice and ileocecal valve. The                            colonoscopy was performed without difficulty. The                            patient tolerated the procedure well. The quality                            of the bowel preparation was good. The ileocecal                            valve, appendiceal orifice, and rectum were                            photographed. Scope In: 7:47:28 AM Scope Out: 8:06:14 AM Scope Withdrawal Time: 0 hours 13 minutes 36 seconds  Total Procedure Duration: 0 hours 18 minutes 46 seconds  Findings:                 The perianal and digital rectal examinations were                            normal.                           Two sessile polyps were found in the ascending                            colon. The polyps were 3 to 4 mm in size. These                            polyps were removed with a cold snare. Resection                            and retrieval were complete.  Three sessile polyps were found in the transverse                            colon. The polyps were 2 to 3 mm in size. These                            polyps were removed with a cold snare. Resection                            and retrieval were complete.                           Internal hemorrhoids were found during                            retroflexion. The hemorrhoids were small.                           The exam was otherwise without abnormality. Complications:            No immediate complications. Estimated blood loss:                            Minimal. Estimated Blood Loss:     Estimated blood loss was minimal. Impression:               - Two 3 to 4 mm polyps in the ascending colon,                            removed with a cold snare. Resected and retrieved.                           - Three 2 to 3 mm polyps in the transverse colon,                             removed with a cold snare. Resected and retrieved.                           - Internal hemorrhoids.                           - The examination was otherwise normal. Recommendation:           - Patient has a contact number available for                            emergencies. The signs and symptoms of potential                            delayed complications were discussed with the                            patient. Return to normal activities tomorrow.  Written discharge instructions were provided to the                            patient.                           - Resume previous diet.                           - Continue present medications.                           - Await pathology results. Elspeth P. Creta Dorame, MD 09/11/2023 8:12:29 AM This report has been signed electronically.

## 2023-09-11 NOTE — Patient Instructions (Signed)

## 2023-09-11 NOTE — Progress Notes (Signed)
 Called to room to assist during endoscopic procedure.  Patient ID and intended procedure confirmed with present staff. Received instructions for my participation in the procedure from the performing physician.

## 2023-09-11 NOTE — Progress Notes (Signed)
 Report to PACU, RN, vss, BBS= Clear.

## 2023-09-11 NOTE — Progress Notes (Signed)
 [facility] History and Physical   Primary Care Physician:  [place], Betty G, MD   Reason for Procedure:   History of colon polyps  Plan:    colonoscopy     HPI: Rick Harris is a 58 y.o. male  here for colonoscopy surveillance - 6 adenomas removed 2021.   [name] Patient denies any bowel symptoms at this time. No family history of colon cancer known. Otherwise feels well without any cardiopulmonary symptoms.   I have discussed risks / benefits of anesthesia and endoscopic procedure with Rick Harris and they wish to proceed with the exams as outlined today.    Past Medical History:  Diagnosis Date   Allergy    seasonal allergies   Asthma    uses inhaler when needed   Frequent headaches    Heart murmur    childhood   Hypercholesteremia    on meds   Hypertension    on meds   OSA (obstructive sleep apnea)    mild-mod (11/2013), declined CPAP to work on weight reduction   Sleep apnea    not have a CPAP machine    Past Surgical History:  Procedure Laterality Date   APPENDECTOMY  2009   NASAL SINUS SURGERY  04/1997   WISDOM TOOTH EXTRACTION  2018    Prior to Admission medications   Medication Sig Start Date End Date Taking? Authorizing Provider  amLODipine  (NORVASC ) 10 MG tablet Take 1 tablet (10 mg total) by mouth daily. 01/05/23  Yes [place], Betty G, MD  fluticasone -salmeterol (ADVAIR) 100-50 MCG/ACT AEPB Inhale 1 puff into the lungs 2 (two) times daily. 01/05/23  Yes [place], Betty G, MD  rosuvastatin  (CRESTOR ) 20 MG tablet Take 1 tablet (20 mg total) by mouth daily. 01/05/23  Yes [place], Betty G, MD  albuterol  (VENTOLIN  HFA) 108 [id number] Base) MCG/ACT inhaler Inhale 2 puffs into the lungs every 4 (four) hours as needed for wheezing or shortness of breath. 08/29/22   [place], Betty G, MD    Current Outpatient Medications  Medication Sig Dispense Refill   amLODipine  (NORVASC ) 10 MG tablet Take 1 tablet (10 mg total) by mouth daily. 90 tablet 2    fluticasone -salmeterol (ADVAIR) 100-50 MCG/ACT AEPB Inhale 1 puff into the lungs 2 (two) times daily. 60 each 6   rosuvastatin  (CRESTOR ) 20 MG tablet Take 1 tablet (20 mg total) by mouth daily. 90 tablet 3   albuterol  (VENTOLIN  HFA) 108 (90 Base) MCG/ACT inhaler Inhale 2 puffs into the lungs every 4 (four) hours as needed for wheezing or shortness of breath. 8.5 each 3   Current Facility-Administered Medications  Medication Dose Route Frequency Provider Last Rate Last Admin   0.9 %  sodium chloride  infusion  500 mL Intravenous Once Caidance Sybert, Elspeth SQUIBB, MD        Allergies as of 09/11/2023   (No Known Allergies)    Family History  Problem Relation Age of Onset   Alcohol abuse Mother    Hyperlipidemia Father        not biologic   Stroke Father        not biologic   Hypertension Father        not biologic   Diabetes Father        not biologic   Lung cancer Maternal Uncle    Heart disease Maternal Grandmother    Glaucoma Maternal Grandmother 71   Lung disease Maternal Grandfather    Colon polyps Neg Hx    Colon cancer Neg Hx  Esophageal cancer Neg Hx    Stomach cancer Neg Hx    Rectal cancer Neg Hx     Social History   Socioeconomic History   Marital status: Married    Spouse name: Not on file   Number of children: 4   Years of education: 14   Highest education level: Some college, no degree  Occupational History   Occupation: Secondary school teacher  Tobacco Use   Smoking status: Former    Current packs/day: 0.00    Average packs/day: 0.3 packs/day for 7.7 years (1.9 ttl pk-yrs)    Types: Cigarettes    Start date: 2016    Quit date: 11/2021    Years since quitting: 1.8   Smokeless tobacco: Never  Vaping Use   Vaping status: Never Used  Substance and Sexual Activity   Alcohol use: Yes    Alcohol/week: 36.0 standard drinks of alcohol    Types: 6 Standard drinks or equivalent, 30 Cans of beer per week   Drug use: No   Sexual activity: Not on file  Other Topics Concern    Not on file  Social History Narrative   Fun: Swim, drink beer    Social Drivers of Health   Financial Resource Strain: Medium Risk (03/04/2021)   Overall Financial Resource Strain (CARDIA)    Difficulty of Paying Living Expenses: Somewhat hard  Food Insecurity: Food Insecurity Present (03/04/2021)   Hunger Vital Sign    Worried About Running Out of Food in the Last Year: Often true    Ran Out of Food in the Last Year: Often true  Transportation Needs: No Transportation Needs (03/04/2021)   PRAPARE - Administrator, Civil Service (Medical): No    Lack of Transportation (Non-Medical): No  Physical Activity: Sufficiently Active (03/04/2021)   Exercise Vital Sign    Days of Exercise per Week: 2 days    Minutes of Exercise per Session: 120 min  Stress: Stress Concern Present (03/04/2021)   [employer] of Occupational Health - Occupational Stress Questionnaire    Feeling of Stress : To some extent  Social Connections: Moderately Integrated (03/04/2021)   Social Connection and Isolation Panel    Frequency of Communication with Friends and Family: Once a week    Frequency of Social Gatherings with Friends and Family: Once a week    Attends Religious Services: More than 4 times per year    Active Member of [employer] or Organizations: Yes    Attends Banker Meetings: Not on file    Marital Status: Married  Catering manager Violence: Not on file    Review of Systems: All other review of systems negative except as mentioned in the HPI.  Physical Exam: Vital signs BP 132/84   Pulse (!) 50   Temp 97.7 F (36.5 C) (Skin)   Ht 6' (1.829 m)   Wt 256 lb (116.1 kg)   SpO2 98%   BMI 34.72 kg/m   General:   Alert,  Well-developed, pleasant and cooperative in NAD Lungs:  Clear throughout to auscultation.   Heart:  Regular rate and rhythm Abdomen:  Soft, nontender and nondistended.   Neuro/Psych:  Alert and cooperative. Normal mood and affect. A and O x 3  Marcey Naval, MD [facility]

## 2023-09-11 NOTE — Progress Notes (Signed)
 Pt's states no medical or surgical changes since previsit or office visit.

## 2023-09-14 ENCOUNTER — Telehealth: Payer: Self-pay

## 2023-09-14 NOTE — Telephone Encounter (Signed)
 Attempted to reach patient for follow up phone call. No answer, left voicemail to contact Dr. Hassan office with any questions or concerns.

## 2023-09-16 ENCOUNTER — Ambulatory Visit: Payer: Self-pay | Admitting: Gastroenterology

## 2023-09-16 LAB — SURGICAL PATHOLOGY

## 2023-10-02 ENCOUNTER — Encounter: Payer: Self-pay | Admitting: Family Medicine

## 2023-10-02 ENCOUNTER — Other Ambulatory Visit: Payer: Self-pay | Admitting: Family Medicine

## 2023-10-02 ENCOUNTER — Ambulatory Visit (INDEPENDENT_AMBULATORY_CARE_PROVIDER_SITE_OTHER): Admitting: Family Medicine

## 2023-10-02 VITALS — BP 128/80 | HR 70 | Resp 16 | Ht 72.0 in | Wt 255.0 lb

## 2023-10-02 DIAGNOSIS — I10 Essential (primary) hypertension: Secondary | ICD-10-CM

## 2023-10-02 DIAGNOSIS — M545 Low back pain, unspecified: Secondary | ICD-10-CM

## 2023-10-02 MED ORDER — TRAMADOL HCL 50 MG PO TABS: 50.0000 mg | ORAL_TABLET | Freq: Two times a day (BID) | ORAL | 0 refills | Status: AC | PRN

## 2023-10-02 MED ORDER — CELECOXIB 100 MG PO CAPS: 100.0000 mg | ORAL_CAPSULE | Freq: Two times a day (BID) | ORAL | 0 refills | Status: AC

## 2023-10-02 NOTE — Progress Notes (Signed)
 ACUTE VISIT Chief Complaint  Patient presents with   Back Pain    Lower back pain on right side, started 7/31   HPI: [name] is a 58 y.o. male with PMHx significant for asthma, HTN, BMI 34,  who is here today with his wife complaining of constant right lower back pain, ongoing 3 weeks .  He recalls sudden onset of pain after sitting down on his lawn mower while working on 09/10/23.  Back Pain This is a recurrent problem. The current episode started 1 to 4 weeks ago. The problem occurs intermittently. The problem has been waxing and waning since onset. The pain is present in the lumbar spine. The quality of the pain is described as aching. The pain is at a severity of 8/10. The pain is moderate. The symptoms are aggravated by bending, standing, twisting and position. Pertinent negatives include no abdominal pain, bladder incontinence, bowel incontinence, chest pain, dysuria, fever, headaches, leg pain, numbness, paresis, pelvic pain, perianal numbness, weakness or weight loss. Risk factors include obesity and sedentary lifestyle. He has tried NSAIDs and muscle relaxant for the symptoms. The treatment provided mild relief.    He denies any unusual activity or trauma.  Evaluated in UC on 09/12/23 for this problem, resolved and pain returned 2 days ago.  He presented to UC again yesterday and had a lumbar xray; results are pending.  UA negative. He was also rx'ed Ibuprofen  and Flexeril.   Describes the pain as localized without radiating down the leg.  He reports a limited ROM of his right hip due to the back pain.  Denies sleep interferences, but pain is aggravated by movements such as rolling in bed or getting out of bed.   No urinary symptoms or gross hematuria.  He has had similar pain in the past, has improved with PT.  HTN on Amlodipine  10 mg daily. Lab Results  Component Value Date   NA 138 01/05/2023   CL 103 01/05/2023   K 4.5 01/05/2023   CO2 26 01/05/2023   BUN  18 01/05/2023   CREATININE 1.25 01/05/2023   GFR 64.93 01/05/2023   CALCIUM  9.4 01/05/2023   ALBUMIN 4.4 01/05/2023   GLUCOSE 98 01/05/2023   Review of Systems  Constitutional:  Positive for activity change. Negative for appetite change, chills, fever and weight loss.  HENT:  Negative for mouth sores and sore throat.   Respiratory:  Negative for cough and shortness of breath.   Cardiovascular:  Negative for chest pain and leg swelling.  Gastrointestinal:  Negative for abdominal pain, bowel incontinence, nausea and vomiting.  Genitourinary:  Negative for bladder incontinence, decreased urine volume, dysuria and pelvic pain.  Musculoskeletal:  Positive for back pain.  Skin:  Negative for rash.  Neurological:  Negative for weakness, numbness and headaches.  See other pertinent positives and negatives in HPI.  Current Outpatient Medications on File Prior to Visit  Medication Sig Dispense Refill   albuterol  (VENTOLIN  HFA) 108 (90 Base) MCG/ACT inhaler Inhale 2 puffs into the lungs every 4 (four) hours as needed for wheezing or shortness of breath. 8.5 each 3   fluticasone -salmeterol (ADVAIR) 100-50 MCG/ACT AEPB Inhale 1 puff into the lungs 2 (two) times daily. 60 each 6   rosuvastatin  (CRESTOR ) 20 MG tablet Take 1 tablet (20 mg total) by mouth daily. 90 tablet 3   No current facility-administered medications on file prior to visit.    Past Medical History:  Diagnosis Date   Allergy  seasonal allergies   Asthma    uses inhaler when needed   Frequent headaches    Heart murmur    childhood   Hypercholesteremia    on meds   Hypertension    on meds   OSA (obstructive sleep apnea)    mild-mod (11/2013), declined CPAP to work on weight reduction   Sleep apnea    not have a CPAP machine   No Known Allergies  Social History   Socioeconomic History   Marital status: Married    Spouse name: Not on file   Number of children: 4   Years of education: 14   Highest education  level: Some college, no degree  Occupational History   Occupation: Secondary school teacher  Tobacco Use   Smoking status: Former    Current packs/day: 0.00    Average packs/day: 0.3 packs/day for 7.7 years (1.9 ttl pk-yrs)    Types: Cigarettes    Start date: 2016    Quit date: 11/2021    Years since quitting: 1.8   Smokeless tobacco: Never  Vaping Use   Vaping status: Never Used  Substance and Sexual Activity   Alcohol use: Yes    Alcohol/week: 36.0 standard drinks of alcohol    Types: 6 Standard drinks or equivalent, 30 Cans of beer per week   Drug use: No   Sexual activity: Not on file  Other Topics Concern   Not on file  Social History Narrative   Fun: Swim, drink beer    Social Drivers of Health   Financial Resource Strain: Low Risk  (10/01/2023)   Overall Financial Resource Strain (CARDIA)    Difficulty of Paying Living Expenses: Not hard at all  Food Insecurity: No Food Insecurity (10/01/2023)   Hunger Vital Sign    Worried About Running Out of Food in the Last Year: Never true    Ran Out of Food in the Last Year: Never true  Transportation Needs: No Transportation Needs (10/01/2023)   Received from [facility]    In the past 12 months, has lack of reliable transportation kept you from medical appointments, meetings, work or from getting things needed for daily living? : No  Physical Activity: Inactive (10/01/2023)   Exercise Vital Sign    Days of Exercise per Week: 0 days    Minutes of Exercise per Session: Not on file  Stress: No Stress Concern Present (10/01/2023)   [employer] of Occupational Health - Occupational Stress Questionnaire    Feeling of Stress: Not at all  Social Connections: Socially Integrated (10/01/2023)   Social Connection and Isolation Panel    Frequency of Communication with Friends and Family: More than three times a week    Frequency of Social Gatherings with Friends and Family: Once a week    Attends Religious Services: More than  4 times per year    Active Member of [employer] or Organizations: Yes    Attends Banker Meetings: More than 4 times per year    Marital Status: Married   Vitals:   10/02/23 1344  BP: 128/80  Pulse: 70  Resp: 16  SpO2: 97%   Body mass index is 34.58 kg/m.  Physical Exam Vitals and nursing note reviewed.  Constitutional:      General: He is not in acute distress.    Appearance: He is well-developed.  HENT:     Head: Normocephalic and atraumatic.  Eyes:     Conjunctiva/sclera: Conjunctivae normal.  Cardiovascular:  Rate and Rhythm: Normal rate and regular rhythm.     Heart sounds: No murmur heard. Pulmonary:     Effort: Pulmonary effort is normal. No respiratory distress.     Breath sounds: Normal breath sounds.  Abdominal:     Palpations: Abdomen is soft. There is no mass.     Tenderness: There is no abdominal tenderness.  Musculoskeletal:     Lumbar back: Tenderness present. No bony tenderness. Negative right straight leg raise test and negative left straight leg raise test.       Back:     Comments: No significant deformity appreciated. Tenderness upon palpation of right SI joint. Pain elicited with movement on exam table during examination and with active hip flexion. No local edema or erythema appreciated, no suspicious lesions.  Skin:    General: Skin is warm.     Findings: No erythema.  Neurological:     Mental Status: He is alert and oriented to person, place, and time.     Deep Tendon Reflexes:     Reflex Scores:      Patellar reflexes are 2+ on the right side and 2+ on the left side.    Comments: Antalgic gait, not assisted.  Psychiatric:        Mood and Affect: Mood and affect normal.    ASSESSMENT AND PLAN: Mr. JUDY GOODENOW was seen today for right lower back pain.   Acute right-sided low back pain without sciatica Had lumbar X ray done in UC yesterday, report is pending. Differential Dx discussed, muscle strain, sacroiliitis, OA  among some. Recommend discontinuing Ibuprofen  and trying Celebrex  100 mg bid x 7-10 days. Flexeril does not seem to be helping, so he can discontinue. Topical icy hot or asper cream with lidocaine  may help. Tramadol  50 mg to take mainly at bedtime. We discussed side effects of medications. Work excuse provided. Options discussed, PT vs sport med consultation, he would like the later option.  -     Ambulatory referral to Sports Medicine -     traMADol  HCl; Take 1 tablet (50 mg total) by mouth every 12 (twelve) hours as needed for up to 5 days.  Dispense: 10 tablet; Refill: 0 -     Celecoxib ; Take 1 capsule (100 mg total) by mouth 2 (two) times daily for 10 days.  Dispense: 20 capsule; Refill: 0  Hypertension, essential, benign BP adequately controlled. Monitor BP at home. We discussed side effects of NSAID's. Continue Amlodipine  10 mg daily.  Return if symptoms worsen or fail to improve, for keep next appointment.  I, Rick Harris, acting as a scribe for Piccola Arico [place], MD., have documented all relevant documentation on the behalf of Rick Burggraf [place], MD, as directed by   while in the presence of Rick Frett [place], MD.  I, Rick Cizek [place], MD, have reviewed all documentation for this visit. The documentation on 10/03/23 for the exam, diagnosis, procedures, and orders are all accurate and complete.   Rick Vieth G. [place], MD  [facility]

## 2023-10-02 NOTE — Patient Instructions (Signed)
 A few things to remember from today's visit:  Acute right-sided low back pain without sciatica - Plan: Ambulatory referral to Sports Medicine, traMADol  (ULTRAM ) 50 MG tablet, celecoxib  (CELEBREX ) 100 MG capsule  Hypertension, essential, benign  Monitor blood pressure closely. Topical icy hot or asper cream with lidocaine  may also help. Tramadol  mainly at bedtime. Celebrex  for 7-10 days. Can take over the counter Tylenol  as well.  If you need refills for medications you take chronically, please call your pharmacy. Do not use My Chart to request refills or for acute issues that need immediate attention. If you send a my chart message, it may take a few days to be addressed, specially if I am not in the office.  Please be sure medication list is accurate. If a new problem present, please set up appointment sooner than planned today.

## 2023-10-09 ENCOUNTER — Ambulatory Visit (INDEPENDENT_AMBULATORY_CARE_PROVIDER_SITE_OTHER)

## 2023-10-09 VITALS — BP 120/88 | Ht 72.0 in | Wt 255.0 lb

## 2023-10-09 DIAGNOSIS — S76011A Strain of muscle, fascia and tendon of right hip, initial encounter: Secondary | ICD-10-CM | POA: Diagnosis not present

## 2023-10-09 NOTE — Progress Notes (Signed)
   Subjective:    Patient ID: Rick Harris, male    DOB: 59 y.o., 1965-10-01   MRN: [id number]  HPI  Chief Complaint: Back pain  Patient is also accompanied by his son, Rick Harris.  58 year old male with history of obesity, prediabetes, OSA  1.51mo ago felt sharp pain in abd while mowing lawn. Eventually started radiatig to his back. Abd pain is now gone.  R side of low back is the painful part.   Seen on 8/21 urgent care where SI joint xrays were done I have reviewed his urgent care encounter note and x-ray results from outside facility and have attached below. IMPRESSION: 1. No erosions. 2. 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 3. No acute fracture or malalignment.   At that visit patient was prescribed Motrin  800 and Flexeril  10 mg. Has been using back brace as well Took 1 dose of the ibuprofen . Has not taken any of the Celebrex . Feels like the Flexeril  helped a little bit at first but now is not very helpful. Seen again on 09/12/2023 in urgent care for the same complaint. Today he reports pain is slightly better from when this originally started 1.5 months ago, but still quite bothersome and limiting    Objective:   Physical Exam Vitals:   10/09/23 0934  BP: 120/88   Lumbar Spine -Inspection: no swelling or skin changes -Palpation: TTP - midline, - paraspinals -AROM/PROM: FROM in all planes of the low back -Strength: full hip flexion (L1/L2), knee extension (L3/4), ankle dorsiflexion (L4/5), hip extension (L5/S1), knee flexion (L5/S1/S2) plantarflexion (S1/2). -Sensation: intact sensation over the medial femoral condyle (L3), patella (L4), lateral femoral condyle (L5), lateral malleolus (S1). -Reflexes: normal patellar (L3/4), hamstring (L5/S1), achilles (S1/2) reflexes, equal bilaterally -Special tests: - Straight Leg Raise, - Stork, - Slump test   Right hip: Flexion 130, extension 15 Tenderness to palpation near the superior medial portion of  the gluteus medius attachment. Pain reproduced with resisted testing of gluteus medius     Assessment & Plan:   [name] is a very pleasant 58 year old male presenting for follow-up on presumed mechanical low back pain flare. Per my evaluation and exam today, he has a high gluteus medius strain.  Given that he has not trialed NSAIDs for any longer than a single dose, I recommend that he initiate the 2-week course of Celebrex  100 mg twice daily which he was prescribed by his PCP but has not yet taken.  Also discussed importance of PT for this and sent referral to PT at [facility].  Recommend follow-up in 4 weeks.  If still worse at that time consider ultrasound +/- shockwave or prolotherapy.

## 2023-10-27 ENCOUNTER — Ambulatory Visit (INDEPENDENT_AMBULATORY_CARE_PROVIDER_SITE_OTHER): Admitting: Sports Medicine

## 2023-10-27 VITALS — BP 130/80 | Ht 73.0 in | Wt 253.0 lb

## 2023-10-27 DIAGNOSIS — M545 Low back pain, unspecified: Secondary | ICD-10-CM | POA: Diagnosis not present

## 2023-10-27 MED ORDER — CYCLOBENZAPRINE HCL 10 MG PO TABS: 10.0000 mg | ORAL_TABLET | Freq: Every evening | ORAL | 0 refills | Status: DC | PRN

## 2023-10-27 MED ORDER — KETOROLAC TROMETHAMINE 30 MG/ML IJ SOLN
60.0000 mg | Freq: Once | INTRAMUSCULAR | Status: AC
  Administered 2023-10-27: 60 mg via INTRAMUSCULAR

## 2023-10-27 MED ORDER — IBUPROFEN 800 MG PO TABS: 800.0000 mg | ORAL_TABLET | Freq: Three times a day (TID) | ORAL | 0 refills | Status: DC | PRN

## 2023-10-27 MED ORDER — METHYLPREDNISOLONE ACETATE 40 MG/ML IJ SUSP
80.0000 mg | Freq: Once | INTRAMUSCULAR | Status: AC
  Administered 2023-10-27: 80 mg via INTRAMUSCULAR

## 2023-10-27 NOTE — Addendum Note (Signed)
 Addended by: GEORGINA GIOVANNA BROCKS on: 10/27/2023 10:37 AM   Modules accepted: Orders

## 2023-10-27 NOTE — Progress Notes (Signed)
   Subjective:    Patient ID: Rick Harris, male    DOB: 1965-10-13, 58 y.o.   MRN: [id number]  HPI Rick Harris presents today with persistent right-sided low back pain.  Initially injured his back while sitting on a lawnmower over a month ago.  He has been prescribed Celebrex , ibuprofen , and Flexeril .  He would like refills on the ibuprofen  and Flexeril .  His pain is worse with standing and walking.  Improves with sitting.  Pain will radiate down the right leg.  He does endorse 1 episode of weakness in the leg.  X-rays done recently did show some arthritic changes in the right SI joint.  He was referred to physical therapy but has not heard from them regarding an appointment.  He denies problems with his back prior to the episode 1 month ago.  Past medical history reviewed Medications reviewed Allergies reviewed   Review of Systems As above    Objective:   Physical Exam  Well-developed, well-nourished.  No acute distress  Lumbar spine: There is some mild spasm of the right paraspinal musculature.  Full painless lumbar flexion.  He does have pain with extension.  Negative straight leg raise.  Reflexes are equal at the patellar tendons bilaterally.  Strength is 5/5 in both lower extremities.      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  60 mg IM Toradol  injection.  I will refill his ibuprofen  and Flexeril  and will refer him to renew physical therapy.  Follow-up in 2 to 3 weeks.  If symptoms persist, consider further diagnostic imaging at that time.  This note was dictated using Dragon naturally speaking software and may contain errors in syntax, spelling, or content which have not been identified prior to signing this note.

## 2023-11-06 ENCOUNTER — Ambulatory Visit

## 2023-11-13 ENCOUNTER — Ambulatory Visit: Admitting: Sports Medicine

## 2023-11-25 ENCOUNTER — Telehealth: Payer: Self-pay

## 2023-11-25 NOTE — Telephone Encounter (Signed)
 Received fax from [facility] stating order will be cancelled due to being unable to reach patient

## 2023-11-27 ENCOUNTER — Ambulatory Visit: Admitting: Pulmonary Disease

## 2023-12-03 ENCOUNTER — Other Ambulatory Visit: Payer: Self-pay

## 2023-12-03 DIAGNOSIS — M545 Low back pain, unspecified: Secondary | ICD-10-CM

## 2023-12-03 DIAGNOSIS — S76011A Strain of muscle, fascia and tendon of right hip, initial encounter: Secondary | ICD-10-CM

## 2023-12-03 MED ORDER — IBUPROFEN 800 MG PO TABS: 800.0000 mg | ORAL_TABLET | Freq: Three times a day (TID) | ORAL | 0 refills | Status: DC | PRN

## 2023-12-07 ENCOUNTER — Other Ambulatory Visit: Payer: Self-pay | Admitting: Sports Medicine

## 2024-01-01 ENCOUNTER — Ambulatory Visit: Payer: Self-pay | Admitting: Family Medicine

## 2024-01-01 ENCOUNTER — Ambulatory Visit: Admitting: Family Medicine

## 2024-01-01 ENCOUNTER — Encounter: Payer: Self-pay | Admitting: Family Medicine

## 2024-01-01 ENCOUNTER — Ambulatory Visit

## 2024-01-01 VITALS — BP 120/70 | HR 69 | Temp 97.9°F | Resp 16 | Ht 73.0 in | Wt 255.8 lb

## 2024-01-01 DIAGNOSIS — N529 Male erectile dysfunction, unspecified: Secondary | ICD-10-CM | POA: Diagnosis not present

## 2024-01-01 DIAGNOSIS — R946 Abnormal results of thyroid function studies: Secondary | ICD-10-CM

## 2024-01-01 DIAGNOSIS — Z0000 Encounter for general adult medical examination without abnormal findings: Secondary | ICD-10-CM | POA: Diagnosis not present

## 2024-01-01 DIAGNOSIS — Z13228 Encounter for screening for other metabolic disorders: Secondary | ICD-10-CM

## 2024-01-01 DIAGNOSIS — E782 Mixed hyperlipidemia: Secondary | ICD-10-CM | POA: Diagnosis not present

## 2024-01-01 DIAGNOSIS — I10 Essential (primary) hypertension: Secondary | ICD-10-CM

## 2024-01-01 DIAGNOSIS — R7989 Other specified abnormal findings of blood chemistry: Secondary | ICD-10-CM

## 2024-01-01 DIAGNOSIS — G5762 Lesion of plantar nerve, left lower limb: Secondary | ICD-10-CM

## 2024-01-01 DIAGNOSIS — R7303 Prediabetes: Secondary | ICD-10-CM

## 2024-01-01 DIAGNOSIS — Z130 Encounter for screening for diseases of the blood and blood-forming organs and certain disorders involving the immune mechanism: Secondary | ICD-10-CM

## 2024-01-01 DIAGNOSIS — Z1329 Encounter for screening for other suspected endocrine disorder: Secondary | ICD-10-CM

## 2024-01-01 LAB — COMPREHENSIVE METABOLIC PANEL WITH GFR
ALT: 37 U/L (ref 0–53)
AST: 27 U/L (ref 0–37)
Albumin: 4.5 g/dL (ref 3.5–5.2)
Alkaline Phosphatase: 68 U/L (ref 39–117)
BUN: 14 mg/dL (ref 6–23)
CO2: 25 meq/L (ref 19–32)
Calcium: 9.3 mg/dL (ref 8.4–10.5)
Chloride: 104 meq/L (ref 96–112)
Creatinine, Ser: 1.13 mg/dL (ref 0.40–1.50)
GFR: 72.78 mL/min (ref >60.00)
Glucose, Bld: 95 mg/dL (ref 70–99)
Potassium: 4.2 meq/L (ref 3.5–5.1)
Sodium: 138 meq/L (ref 135–145)
Total Bilirubin: 0.5 mg/dL (ref 0.2–1.2)
Total Protein: 7.6 g/dL (ref 6.0–8.3)

## 2024-01-01 LAB — LIPID PANEL
Cholesterol: 175 mg/dL (ref 0–200)
HDL: 63.1 mg/dL (ref >39.00)
LDL Cholesterol: 81 mg/dL (ref 0–99)
NonHDL: 112.28
Total CHOL/HDL Ratio: 3
Triglycerides: 156 mg/dL — ABNORMAL HIGH (ref 0.0–149.0)
VLDL: 31.2 mg/dL (ref 0.0–40.0)

## 2024-01-01 LAB — HEMOGLOBIN A1C: Hgb A1c MFr Bld: 6.1 % (ref 4.6–6.5)

## 2024-01-01 LAB — TSH: TSH: 5.78 u[IU]/mL — ABNORMAL HIGH (ref 0.35–5.50)

## 2024-01-01 LAB — TESTOSTERONE: Testosterone: 225.63 ng/dL — ABNORMAL LOW (ref 300.00–890.00)

## 2024-01-01 LAB — PSA: PSA: 0.34 ng/mL (ref 0.10–4.00)

## 2024-01-01 MED ORDER — SILDENAFIL CITRATE 20 MG PO TABS: 20.0000 mg | ORAL_TABLET | Freq: Every day | ORAL | 1 refills | Status: AC | PRN

## 2024-01-01 NOTE — Assessment & Plan Note (Signed)
 We discussed the importance of regular physical activity and healthy diet for prevention of chronic illness and/or complications. Preventive guidelines reviewed. Vaccination: He is due for Shingrix, Prevnar 20, and flu vaccine, he is not interested in getting vaccines today. Next CPE in a year.

## 2024-01-01 NOTE — Progress Notes (Signed)
 Chief Complaint  Patient presents with   Annual Exam   Discussed the use of AI scribe software for clinical note transcription with the patient, who gave verbal consent to proceed.  History of Present Illness Rick Harris is a 58 year old male with PMHx significant for asthma, HTN, BMI 34,  who is here today with his wife (for part of the visit) who presents for an annual physical exam.  He engages in swimming as a form of exercise, initially three times a week but recently reduced to once a week for 20 minutes per session. 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  His diet has deteriorated, with increased bread consumption and decreased intake of healthier options like cauliflower rice. He finds it difficult to maintain a healthy diet.  He sleeps approximately four to five hours per night, which is less than ideal. He consumes about 22 beers per week. He does not smoke. He does not have a dentist to see regularly. He has an eye doctor appointment scheduled for December 2025.  Immunization History  Administered Date(s) Administered   PFIZER Comirnaty(Gray Top)Covid-19 Tri-Sucrose Vaccine 05/12/2019, 06/07/2019   Td 02/11/2008   Tdap 09/04/2014, 12/17/2020    Health Maintenance  Topic Date Due   COVID-19 Vaccine (3 - 2025-26 season) 01/17/2024 (Originally 10/12/2023)   Zoster Vaccines- Shingrix (1 of 2) 04/02/2024 (Originally 09/25/2017)   Influenza Vaccine  05/10/2024 (Originally 09/11/2023)   Pneumococcal Vaccine: 50+ Years (1 of 2 - PCV) 12/31/2024 (Originally 09/26/1986)   Hepatitis B Vaccines 19-59 Average Risk (1 of 3 - 19+ 3-dose series) 12/31/2024 (Originally 09/26/1986)   Colonoscopy  09/11/2026   DTaP/Tdap/Td (4 - Td or Tdap) 12/18/2030   Hepatitis C Screening  Completed   HIV Screening  Completed   HPV VACCINES  Aged Out   Meningococcal B Vaccine  Aged Out   He has experienced erectile dysfunction since April/2025, with a decrease in morning erections  and difficulty keeping an erection long enough for intercourse. He questions if his medication might be contributing to this issue. Negative for any anatomical deformity or genital lesions. He has tried OTC medication/supplement.  He experiences numbness in the plantar aspect in between 4th-5th toes of his left foot, which has been going on for a while and stable.  Asthma: He uses Advair 100-50 mcg 1 puff twice daily as needed and albuterol  inhaler as needed. He reports a recent episode of wheezing after drinking cold water, but generally does not experience wheezing.  He is currently asymptomatic.  He has been diagnosed with sleep apnea but has not acquired a CPAP machine due to cost. He experiences fatigue at work and occasional sleepiness after breakfast.  Last prostate ca screening: Nocturia x 1-2. Negative for changes in urinary frequency.  Lab Results  Component Value Date   PSA 0.43 12/21/2020   PSA 0.32 07/29/2013   Hypertension: Currently he is on amlodipine  10 mg daily.  Lab Results  Component Value Date   NA 138 01/05/2023   CL 103 01/05/2023   K 4.5 01/05/2023   CO2 26 01/05/2023   BUN 18 01/05/2023   CREATININE 1.25 01/05/2023   GFR 64.93 01/05/2023   CALCIUM  9.4 01/05/2023   ALBUMIN 4.4 01/05/2023   GLUCOSE 98 01/05/2023   Hyperlipidemia: Currently he is on rosuvastatin  20 mg daily. Lab Results  Component Value Date   CHOL 173 01/05/2023   HDL 53.40 01/05/2023   LDLCALC 85 01/05/2023   LDLDIRECT  186.0 12/21/2020   TRIG 172.0 (H) 01/05/2023   CHOLHDL 3 01/05/2023   Lab Results  Component Value Date   ALT 39 01/05/2023   AST 36 01/05/2023   ALKPHOS 81 01/05/2023   BILITOT 0.5 01/05/2023   Hemoglobin A1c has been mildly elevated in the past, no history of diabetes.  Lab Results  Component Value Date   HGBA1C 5.7 05/08/2023   Review of Systems  Constitutional:  Positive for fatigue. Negative for activity change, appetite change and fever.  HENT:   Negative for nosebleeds, sore throat and trouble swallowing.   Eyes:  Negative for redness and visual disturbance.  Respiratory:  Negative for cough, shortness of breath and wheezing.   Cardiovascular:  Negative for chest pain, palpitations and leg swelling.  Gastrointestinal:  Negative for abdominal pain, blood in stool, nausea and vomiting.  Endocrine: Negative for cold intolerance, heat intolerance, polydipsia, polyphagia and polyuria.  Genitourinary:  Negative for decreased urine volume, dysuria, genital sores, hematuria and testicular pain.  Musculoskeletal:  Negative for gait problem and myalgias.  Skin:  Negative for color change and rash.  Allergic/Immunologic: Positive for environmental allergies.  Neurological:  Negative for syncope, weakness and headaches.  Hematological:  Negative for adenopathy. Does not bruise/bleed easily.  Psychiatric/Behavioral:  Negative for confusion. The patient is not nervous/anxious.    Current Outpatient Medications on File Prior to Visit  Medication Sig Dispense Refill   albuterol  (VENTOLIN  HFA) 108 (90 Base) MCG/ACT inhaler Inhale 2 puffs into the lungs every 4 (four) hours as needed for wheezing or shortness of breath. 8.5 each 3   amLODipine  (NORVASC ) 10 MG tablet TAKE 1 TABLET BY MOUTH EVERY DAY 90 tablet 2   cyclobenzaprine  (FLEXERIL ) 10 MG tablet TAKE 1 TABLET BY MOUTH AT BEDTIME AS NEEDED FOR MUSCLE SPASMS 30 tablet 0   fluticasone -salmeterol (ADVAIR) 100-50 MCG/ACT AEPB Inhale 1 puff into the lungs 2 (two) times daily. 60 each 6   rosuvastatin  (CRESTOR ) 20 MG tablet Take 1 tablet (20 mg total) by mouth daily. 90 tablet 3   No current facility-administered medications on file prior to visit.   Past Medical History:  Diagnosis Date   Allergy    seasonal allergies   Asthma    uses inhaler when needed   Frequent headaches    Heart murmur    childhood   Hypercholesteremia    on meds   Hypertension    on meds   OSA (obstructive sleep  apnea)    mild-mod (11/2013), declined CPAP to work on weight reduction   Sleep apnea    not have a CPAP machine    Past Surgical History:  Procedure Laterality Date   APPENDECTOMY  2009   NASAL SINUS SURGERY  04/1997   WISDOM TOOTH EXTRACTION  2018    No Known Allergies  Family History  Problem Relation Age of Onset   Alcohol abuse Mother    Hyperlipidemia Father        not biologic   Stroke Father        not biologic   Hypertension Father        not biologic   Diabetes Father        not biologic   Lung cancer Maternal Uncle    Heart disease Maternal Grandmother    Glaucoma Maternal Grandmother 64   Lung disease Maternal Grandfather    Colon polyps Neg Hx    Colon cancer Neg Hx    Esophageal cancer Neg Hx  Stomach cancer Neg Hx    Rectal cancer Neg Hx     Social History   Socioeconomic History   Marital status: Married    Spouse name: Not on file   Number of children: 4   Years of education: 14   Highest education level: Some college, no degree  Occupational History   Occupation: Secondary School Teacher  Tobacco Use   Smoking status: Former    Current packs/day: 0.00    Average packs/day: 0.3 packs/day for 7.7 years (1.9 ttl pk-yrs)    Types: Cigarettes    Start date: 2016    Quit date: 11/2021    Years since quitting: 2.1   Smokeless tobacco: Never  Vaping Use   Vaping status: Never Used  Substance and Sexual Activity   Alcohol use: Yes    Alcohol/week: 36.0 standard drinks of alcohol    Types: 6 Standard drinks or equivalent, 30 Cans of beer per week   Drug use: No   Sexual activity: Not on file  Other Topics Concern   Not on file  Social History Narrative   Fun: Swim, drink beer    Social Drivers of Health   Financial Resource Strain: Low Risk  (10/01/2023)   Overall Financial Resource Strain (CARDIA)    Difficulty of Paying Living Expenses: Not hard at all  Food Insecurity: No Food Insecurity (10/01/2023)   Hunger Vital Sign    Worried About Running  Out of Food in the Last Year: Never true    Ran Out of Food in the Last Year: Never true  Transportation Needs: No Transportation Needs (10/01/2023)   Received from [employer]    In the past 12 months, has lack of reliable transportation kept you from medical appointments, meetings, work or from getting things needed for daily living? : No  Physical Activity: Inactive (10/01/2023)   Exercise Vital Sign    Days of Exercise per Week: 0 days    Minutes of Exercise per Session: Not on file  Stress: No Stress Concern Present (10/01/2023)   [employer] of Occupational Health - Occupational Stress Questionnaire    Feeling of Stress: Not at all  Social Connections: Socially Integrated (10/01/2023)   Social Connection and Isolation Panel    Frequency of Communication with Friends and Family: More than three times a week    Frequency of Social Gatherings with Friends and Family: Once a week    Attends Religious Services: More than 4 times per year    Active Member of [employer] or Organizations: Yes    Attends Banker Meetings: More than 4 times per year    Marital Status: Married   Vitals:   01/01/24 0703  BP: 120/70  Pulse: 69  Resp: 16  Temp: 97.9 F (36.6 C)  SpO2: 95%   Body mass index is 33.75 kg/m.  Wt Readings from Last 3 Encounters:  01/01/24 255 lb 12.8 oz (116 kg)  10/27/23 253 lb (114.8 kg)  10/09/23 255 lb (115.7 kg)   Physical Exam Vitals and nursing note reviewed.  Constitutional:      General: He is not in acute distress.    Appearance: He is well-developed.  HENT:     Head: Normocephalic and atraumatic.     Right Ear: Tympanic membrane, ear canal and external ear normal.     Left Ear: Tympanic membrane, ear canal and external ear normal.     Ears:     Comments: Left ear canal a whitish,pearl  like papular lesion. Stable.    Mouth/Throat:     Mouth: Mucous membranes are moist.     Pharynx: Oropharynx is clear.  Eyes:      Extraocular Movements:     Right eye: Abnormal extraocular motion present.     Left eye: Normal extraocular motion.     Conjunctiva/sclera: Conjunctivae normal.     Pupils: Pupils are equal, round, and reactive to light.  Neck:     Thyroid : No thyroid  mass or thyromegaly.  Cardiovascular:     Rate and Rhythm: Normal rate and regular rhythm.     Pulses:          Dorsalis pedis pulses are 2+ on the right side and 2+ on the left side.     Heart sounds: No murmur heard. Pulmonary:     Effort: Pulmonary effort is normal. No respiratory distress.     Breath sounds: Normal breath sounds. No rhonchi or rales.  Abdominal:     Palpations: Abdomen is soft. There is no hepatomegaly or mass.     Tenderness: There is no abdominal tenderness.  Genitourinary:    Comments: No concerns. Musculoskeletal:        General: No tenderness.     Cervical back: Normal range of motion.     Comments: No major deformities appreciated and no signs of synovitis.  Lymphadenopathy:     Cervical: No cervical adenopathy.     Upper Body:     Right upper body: No supraclavicular adenopathy.     Left upper body: No supraclavicular adenopathy.  Skin:    General: Skin is warm.     Findings: No erythema or rash.  Neurological:     General: No focal deficit present.     Mental Status: He is alert and oriented to person, place, and time.     Cranial Nerves: No cranial nerve deficit.     Sensory: No sensory deficit.     Coordination: Coordination normal.     Gait: Gait normal.     Deep Tendon Reflexes:     Reflex Scores:      Bicep reflexes are 2+ on the right side and 2+ on the left side.      Patellar reflexes are 2+ on the right side and 2+ on the left side. Psychiatric:        Mood and Affect: Mood and affect normal.    ASSESSMENT AND PLAN:  Mr. Triston Skare was seen today for annual exam.  Diagnoses and all orders for this visit:  Orders Placed This Encounter  Procedures   Comprehensive metabolic  panel with GFR   Hemoglobin A1c   Lipid panel   TSH   PSA   Testosterone    Lab Results  Component Value Date   NA 138 01/01/2024   CL 104 01/01/2024   K 4.2 01/01/2024   CO2 25 01/01/2024   BUN 14 01/01/2024   CREATININE 1.13 01/01/2024   GFR 72.78 01/01/2024   CALCIUM  9.3 01/01/2024   ALBUMIN 4.5 01/01/2024   GLUCOSE 95 01/01/2024   Lab Results  Component Value Date   ALT 37 01/01/2024   AST 27 01/01/2024   ALKPHOS 68 01/01/2024   BILITOT 0.5 01/01/2024   Lab Results  Component Value Date   CHOL 175 01/01/2024   HDL 63.10 01/01/2024   LDLCALC 81 01/01/2024   LDLDIRECT 186.0 12/21/2020   TRIG 156.0 (H) 01/01/2024   CHOLHDL 3 01/01/2024   Lab Results  Component Value Date  HGBA1C 6.1 01/01/2024   Lab Results  Component Value Date   TSH 5.78 (H) 01/01/2024   Lab Results  Component Value Date   TESTOSTERONE  225.63 (L) 01/01/2024   Routine general medical examination at a health care facility Assessment & Plan: We discussed the importance of regular physical activity and healthy diet for prevention of chronic illness and/or complications. Preventive guidelines reviewed. Vaccination: He is due for Shingrix, Prevnar 20, and flu vaccine, he is not interested in getting vaccines today. Next CPE in a year.  Prediabetes Assessment & Plan: Encouraged consistency with a healthy lifestyle for diabetes prevention. Further recommendation will be given according to hemoglobin A1c result.  Orders: -     Hemoglobin A1c; Future  Hypertension, essential, benign Assessment & Plan: BP today adequately controlled. He is not sure if his BP monitor is accurate, we discussed appropriate technique and he can bring it with him next visit. Continue amlodipine  10 mg daily and recommend trying to be consistent with following a low-salt diet. Continue monitoring BP regularly. He has an appointment with eye care provider in 01/2024. Follow-up in 6 months.  Orders: -      Comprehensive metabolic panel with GFR; Future  Screening for endocrine, metabolic and immunity disorder -     Comprehensive metabolic panel with GFR; Future -     Hemoglobin A1c; Future  Mixed hyperlipidemia Assessment & Plan: Currently on Rosuvastatin  20 mg daily. Further recommendations according to FLP results.  Orders: -     Comprehensive metabolic panel with GFR; Future -     Lipid panel; Future  Erectile dysfunction, unspecified erectile dysfunction type Assessment & Plan: We discussed possible etiologies, including some of his co-morbilities and medications. We discussed treatment options, he agrees with trying Sildenafil  20 mg 1-2 tabs daily as needed. Further recommendations according to lab results.  Orders: -     TSH; Future -     PSA; Future -     Sildenafil  Citrate; Take 1-2 tablets (20-40 mg total) by mouth daily as needed.  Dispense: 60 tablet; Refill: 1 -     Testosterone ; Future  Morton's neuroma of left foot We discussed Dx. Recommend arranging appt with podiatrist.   Return in 6 months (on 06/30/2024) for chronic problems.  Arda Daggs G. Lillis Nuttle, MD  [facility] [place] office.

## 2024-01-01 NOTE — Assessment & Plan Note (Signed)
Encouraged consistency with a healthy lifestyle for diabetes prevention. Further recommendation will be given according to hemoglobin A1c result. 

## 2024-01-01 NOTE — Assessment & Plan Note (Signed)
 Currently on Rosuvastatin  20 mg daily. Further recommendations according to FLP results.

## 2024-01-01 NOTE — Progress Notes (Signed)
 Patient presented with home blood pressure machine. Patient states he believes the machine is not functioning properly. BP reading collected with machine. 149/92. Manual BP in right arm 150/90. Educated patient on proper use of blood pressure machine and when to take blood pressure during the day. Patient voiced understanding.

## 2024-01-01 NOTE — Patient Instructions (Addendum)
 A few things to remember from today's visit:  Routine general medical examination at a health care facility  Prediabetes - Plan: Hemoglobin A1c  Hypertension, essential, benign - Plan: Comprehensive metabolic panel with GFR  Screening for endocrine, metabolic and immunity disorder - Plan: Comprehensive metabolic panel with GFR, Hemoglobin A1c  Mixed hyperlipidemia - Plan: Comprehensive metabolic panel with GFR, Lipid panel  Erectile dysfunction, unspecified erectile dysfunction type - Plan: TSH, PSA, sildenafil  (REVATIO ) 20 MG tablet  Morton's neuroma of left foot  If you need refills for medications you take chronically, please call your pharmacy. Do not use My Chart to request refills or for acute issues that need immediate attention. If you send a my chart message, it may take a few days to be addressed, specially if I am not in the office.  Please be sure medication list is accurate. If a new problem present, please set up appointment sooner than planned today.  Health Maintenance, Male Adopting a healthy lifestyle and getting preventive care are important in promoting health and wellness. Ask your health care provider about: The right schedule for you to have regular tests and exams. Things you can do on your own to prevent diseases and keep yourself healthy. What should I know about diet, weight, and exercise? Eat a healthy diet  Eat a diet that includes plenty of vegetables, fruits, low-fat dairy products, and lean protein. Do not eat a lot of foods that are high in solid fats, added sugars, or sodium. Maintain a healthy weight Body mass index (BMI) is a measurement that can be used to identify possible weight problems. It estimates body fat based on height and weight. Your health care provider can help determine your BMI and help you achieve or maintain a healthy weight. Get regular exercise Get regular exercise. This is one of the most important things you can do for your  health. Most adults should: Exercise for at least 150 minutes each week. The exercise should increase your heart rate and make you sweat (moderate-intensity exercise). Do strengthening exercises at least twice a week. This is in addition to the moderate-intensity exercise. Spend less time sitting. Even light physical activity can be beneficial. Watch cholesterol and blood lipids Have your blood tested for lipids and cholesterol at 56 years of age, then have this test every 5 years. You may need to have your cholesterol levels checked more often if: Your lipid or cholesterol levels are high. You are older than 56 years of age. You are at high risk for heart disease. What should I know about cancer screening? Many types of cancers can be detected early and may often be prevented. Depending on your health history and family history, you may need to have cancer screening at various ages. This may include screening for: Colorectal cancer. Prostate cancer. Skin cancer. Lung cancer. What should I know about heart disease, diabetes, and high blood pressure? Blood pressure and heart disease High blood pressure causes heart disease and increases the risk of stroke. This is more likely to develop in people who have high blood pressure readings or are overweight. Talk with your health care provider about your target blood pressure readings. Have your blood pressure checked: Every 3-5 years if you are 16-48 years of age. Every year if you are 62 years old or older. If you are between the ages of 49 and 32 and are a current or former smoker, ask your health care provider if you should have a one-time screening for  abdominal aortic aneurysm (AAA). Diabetes Have regular diabetes screenings. This checks your fasting blood sugar level. Have the screening done: Once every three years after age 28 if you are at a normal weight and have a low risk for diabetes. More often and at a younger age if you are  overweight or have a high risk for diabetes. What should I know about preventing infection? Hepatitis B If you have a higher risk for hepatitis B, you should be screened for this virus. Talk with your health care provider to find out if you are at risk for hepatitis B infection. Hepatitis C Blood testing is recommended for: Everyone born from [birth year] through 1963. Anyone with known risk factors for hepatitis C. Sexually transmitted infections (STIs) You should be screened each year for STIs, including gonorrhea and chlamydia, if: You are sexually active and are younger than 56 years of age. You are older than 56 years of age and your health care provider tells you that you are at risk for this type of infection. Your sexual activity has changed since you were last screened, and you are at increased risk for chlamydia or gonorrhea. Ask your health care provider if you are at risk. Ask your health care provider about whether you are at high risk for HIV. Your health care provider may recommend a prescription medicine to help prevent HIV infection. If you choose to take medicine to prevent HIV, you should first get tested for HIV. You should then be tested every 3 months for as long as you are taking the medicine. Follow these instructions at home: Alcohol use Do not drink alcohol if your health care provider tells you not to drink. If you drink alcohol: Limit how much you have to 0-2 drinks a day. Know how much alcohol is in your drink. In the [place] one drink equals one 12 oz bottle of beer (355 mL), one 5 oz glass of wine (148 mL), or one 1 oz glass of hard liquor (44 mL). Lifestyle Do not use any products that contain nicotine or tobacco. These products include cigarettes, chewing tobacco, and vaping devices, such as e-cigarettes. If you need help quitting, ask your health care provider. Do not use street drugs. Do not share needles. Ask your health care provider for help if you need support or  information about quitting drugs. General instructions Schedule regular health, dental, and eye exams. Stay current with your vaccines. Tell your health care provider if: You often feel depressed. You have ever been abused or do not feel safe at home. Summary Adopting a healthy lifestyle and getting preventive care are important in promoting health and wellness. Follow your health care provider's instructions about healthy diet, exercising, and getting tested or screened for diseases. Follow your health care provider's instructions on monitoring your cholesterol and blood pressure. This information is not intended to replace advice given to you by your health care provider. Make sure you discuss any questions you have with your health care provider. Document Revised: 06/18/2020 Document Reviewed: 06/18/2020 Elsevier Patient Education  2024 [employer].

## 2024-01-01 NOTE — Assessment & Plan Note (Signed)
 BP today adequately controlled. He is not sure if his BP monitor is accurate, we discussed appropriate technique and he can bring it with him next visit. Continue amlodipine  10 mg daily and recommend trying to be consistent with following a low-salt diet. Continue monitoring BP regularly. He has an appointment with eye care provider in 01/2024. Follow-up in 6 months.

## 2024-01-01 NOTE — Assessment & Plan Note (Signed)
 We discussed possible etiologies, including some of his co-morbilities and medications. We discussed treatment options, he agrees with trying Sildenafil  20 mg 1-2 tabs daily as needed. Further recommendations according to lab results.

## 2024-01-13 ENCOUNTER — Other Ambulatory Visit: Payer: Self-pay | Admitting: Family Medicine

## 2024-01-13 DIAGNOSIS — E782 Mixed hyperlipidemia: Secondary | ICD-10-CM

## 2024-02-19 ENCOUNTER — Other Ambulatory Visit (INDEPENDENT_AMBULATORY_CARE_PROVIDER_SITE_OTHER)

## 2024-02-19 DIAGNOSIS — R7989 Other specified abnormal findings of blood chemistry: Secondary | ICD-10-CM

## 2024-02-19 DIAGNOSIS — R946 Abnormal results of thyroid function studies: Secondary | ICD-10-CM | POA: Diagnosis not present

## 2024-02-19 LAB — T4, FREE: Free T4: 0.66 ng/dL (ref 0.60–1.60)

## 2024-02-19 LAB — TSH: TSH: 3.68 u[IU]/mL (ref 0.35–5.50)

## 2024-02-20 ENCOUNTER — Ambulatory Visit: Payer: Self-pay | Admitting: Family Medicine

## 2024-02-22 LAB — TESTOSTERONE, FREE, TOTAL, SHBG
Sex Hormone Binding: 37.1 nmol/L (ref 19.3–76.4)
Testosterone, Free: 6.4 pg/mL — AB (ref 7.2–24.0)
Testosterone: 381 ng/dL (ref 264–916)

## 2024-07-01 ENCOUNTER — Ambulatory Visit: Admitting: Family Medicine
